# Patient Record
Sex: Male | Born: 1945 | Race: White | Hispanic: No | Marital: Single | Smoking: Current every day smoker
Health system: Southern US, Community
[De-identification: ages and names within clinical notes are randomized; demographics above are authoritative.]

## PROBLEM LIST (undated history)

## (undated) DIAGNOSIS — D649 Anemia, unspecified: Secondary | ICD-10-CM

## (undated) DIAGNOSIS — I255 Ischemic cardiomyopathy: Secondary | ICD-10-CM

## (undated) DIAGNOSIS — I5022 Chronic systolic (congestive) heart failure: Secondary | ICD-10-CM

## (undated) DIAGNOSIS — I2510 Atherosclerotic heart disease of native coronary artery without angina pectoris: Secondary | ICD-10-CM

## (undated) DIAGNOSIS — F329 Major depressive disorder, single episode, unspecified: Secondary | ICD-10-CM

## (undated) DIAGNOSIS — Z720 Tobacco use: Secondary | ICD-10-CM

## (undated) DIAGNOSIS — F419 Anxiety disorder, unspecified: Secondary | ICD-10-CM

## (undated) DIAGNOSIS — F1010 Alcohol abuse, uncomplicated: Secondary | ICD-10-CM

## (undated) DIAGNOSIS — R0789 Other chest pain: Secondary | ICD-10-CM

## (undated) DIAGNOSIS — R5383 Other fatigue: Secondary | ICD-10-CM

## (undated) DIAGNOSIS — R0602 Shortness of breath: Secondary | ICD-10-CM

## (undated) DIAGNOSIS — I219 Acute myocardial infarction, unspecified: Secondary | ICD-10-CM

## (undated) DIAGNOSIS — I509 Heart failure, unspecified: Secondary | ICD-10-CM

## (undated) DIAGNOSIS — F32A Depression, unspecified: Secondary | ICD-10-CM

## (undated) DIAGNOSIS — J449 Chronic obstructive pulmonary disease, unspecified: Secondary | ICD-10-CM

## (undated) DIAGNOSIS — J439 Emphysema, unspecified: Secondary | ICD-10-CM

## (undated) DIAGNOSIS — M1990 Unspecified osteoarthritis, unspecified site: Secondary | ICD-10-CM

## (undated) DIAGNOSIS — I10 Essential (primary) hypertension: Secondary | ICD-10-CM

## (undated) DIAGNOSIS — R55 Syncope and collapse: Secondary | ICD-10-CM

## (undated) HISTORY — DX: Acute myocardial infarction, unspecified: I21.9

## (undated) HISTORY — DX: Chronic obstructive pulmonary disease, unspecified: J44.9

## (undated) HISTORY — DX: Tobacco use: Z72.0

## (undated) HISTORY — DX: Atherosclerotic heart disease of native coronary artery without angina pectoris: I25.10

## (undated) HISTORY — DX: Anxiety disorder, unspecified: F41.9

## (undated) HISTORY — DX: Ischemic cardiomyopathy: I25.5

## (undated) HISTORY — DX: Alcohol abuse, uncomplicated: F10.10

## (undated) HISTORY — DX: Unspecified osteoarthritis, unspecified site: M19.90

## (undated) HISTORY — DX: Shortness of breath: R06.02

## (undated) HISTORY — DX: Essential (primary) hypertension: I10

## (undated) HISTORY — DX: Chronic systolic (congestive) heart failure: I50.22

## (undated) HISTORY — DX: Syncope and collapse: R55

## (undated) HISTORY — PX: OTHER SURGICAL HISTORY: SHX169

## (undated) HISTORY — DX: Other chest pain: R07.89

## (undated) HISTORY — DX: Major depressive disorder, single episode, unspecified: F32.9

## (undated) HISTORY — DX: Anemia, unspecified: D64.9

## (undated) HISTORY — DX: Emphysema, unspecified: J43.9

## (undated) HISTORY — DX: Other fatigue: R53.83

## (undated) HISTORY — DX: Depression, unspecified: F32.A

---

## 2009-03-24 ENCOUNTER — Emergency Department (HOSPITAL_COMMUNITY): Admission: EM | Admit: 2009-03-24 | Discharge: 2009-03-24 | Payer: Self-pay | Admitting: Emergency Medicine

## 2009-05-29 HISTORY — PX: CORONARY ARTERY BYPASS GRAFT: SHX141

## 2009-11-01 ENCOUNTER — Emergency Department (HOSPITAL_COMMUNITY): Admission: EM | Admit: 2009-11-01 | Discharge: 2009-11-01 | Payer: Self-pay | Admitting: Emergency Medicine

## 2011-09-27 ENCOUNTER — Institutional Professional Consult (permissible substitution): Payer: Self-pay | Admitting: Cardiology

## 2011-10-27 ENCOUNTER — Encounter: Payer: Self-pay | Admitting: Cardiology

## 2011-10-27 ENCOUNTER — Ambulatory Visit (INDEPENDENT_AMBULATORY_CARE_PROVIDER_SITE_OTHER): Payer: Medicare Other | Admitting: Cardiology

## 2011-10-27 VITALS — BP 120/60 | HR 80 | Ht 64.0 in | Wt 153.0 lb

## 2011-10-27 DIAGNOSIS — I255 Ischemic cardiomyopathy: Secondary | ICD-10-CM

## 2011-10-27 DIAGNOSIS — F17200 Nicotine dependence, unspecified, uncomplicated: Secondary | ICD-10-CM

## 2011-10-27 DIAGNOSIS — R0602 Shortness of breath: Secondary | ICD-10-CM

## 2011-10-27 DIAGNOSIS — I219 Acute myocardial infarction, unspecified: Secondary | ICD-10-CM

## 2011-10-27 DIAGNOSIS — I2589 Other forms of chronic ischemic heart disease: Secondary | ICD-10-CM | POA: Diagnosis not present

## 2011-10-27 DIAGNOSIS — I10 Essential (primary) hypertension: Secondary | ICD-10-CM | POA: Diagnosis not present

## 2011-10-27 DIAGNOSIS — Z951 Presence of aortocoronary bypass graft: Secondary | ICD-10-CM

## 2011-10-27 DIAGNOSIS — J449 Chronic obstructive pulmonary disease, unspecified: Secondary | ICD-10-CM

## 2011-10-27 DIAGNOSIS — Z720 Tobacco use: Secondary | ICD-10-CM | POA: Insufficient documentation

## 2011-10-27 DIAGNOSIS — R0789 Other chest pain: Secondary | ICD-10-CM

## 2011-10-27 DIAGNOSIS — I2510 Atherosclerotic heart disease of native coronary artery without angina pectoris: Secondary | ICD-10-CM

## 2011-10-27 DIAGNOSIS — J441 Chronic obstructive pulmonary disease with (acute) exacerbation: Secondary | ICD-10-CM | POA: Insufficient documentation

## 2011-10-27 LAB — CBC WITH DIFFERENTIAL/PLATELET
Basophils Absolute: 0 10*3/uL (ref 0.0–0.1)
HCT: 37.4 % — ABNORMAL LOW (ref 39.0–52.0)
Hemoglobin: 13.6 g/dL (ref 13.0–17.0)
MCH: 32.5 pg (ref 26.0–34.0)
MCHC: 36.4 g/dL — ABNORMAL HIGH (ref 30.0–36.0)
MCV: 89.5 fL (ref 78.0–100.0)
Monocytes Absolute: 0.9 10*3/uL (ref 0.1–1.0)
Monocytes Relative: 11 % (ref 3–12)
Neutro Abs: 5.4 10*3/uL (ref 1.7–7.7)
Platelets: 219 10*3/uL (ref 150–400)

## 2011-10-27 LAB — HEPATIC FUNCTION PANEL
ALT: 17 U/L (ref 0–53)
AST: 25 U/L (ref 0–37)
Albumin: 4.3 g/dL (ref 3.5–5.2)
Bilirubin, Direct: 0.2 mg/dL (ref 0.0–0.3)
Indirect Bilirubin: 0.4 mg/dL (ref 0.0–0.9)
Total Protein: 7 g/dL (ref 6.0–8.3)

## 2011-10-27 LAB — BASIC METABOLIC PANEL
Creat: 0.94 mg/dL (ref 0.50–1.35)
Sodium: 130 mEq/L — ABNORMAL LOW (ref 135–145)

## 2011-10-27 LAB — LIPID PANEL
HDL: 60 mg/dL (ref >39)
VLDL: 8 mg/dL (ref 0–40)

## 2011-10-27 NOTE — Patient Instructions (Addendum)
Increase lisinopril to 20 mg daily.  We will get your records from the [facility]  We will refer you to a cardiac surgeon for a second opinion about your sternum  You need to quit drinking alcohol and stop smoking.  We will check lab work today.

## 2011-10-27 NOTE — Assessment & Plan Note (Signed)
I counseled patient on the benefits of smoking cessation. He is unwilling to quit at this time. I also discussed the importance of alcohol abstinence especially with his left ventricular dysfunction.

## 2011-10-27 NOTE — Assessment & Plan Note (Signed)
He is status post CABG February 2012. We have requested his records from the [facility] [place]. He appears to be on appropriate therapy at this time but given his left ventricular dysfunction we will increase his lisinopril to 20 mg daily. We will obtain baseline labs today including CBC, chemistries, lipid panel, and BNP.

## 2011-10-27 NOTE — Progress Notes (Signed)
[name] Date of Birth: [birth year] Medical Record #[id number]  History of Present Illness: [name] is a 66 year old white male who is self-referred for cardiac care. He has a known history of coronary disease. He initially presented with syncope in February 2011. He underwent cardiac catheterization and subsequent coronary bypass surgery x4 at the [facility]. 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 Since his heart surgery he has had nonunion of the left parasternal region. He has had significant musculoskeletal pain. He complains that his chest hurts all the time and that his ribs move back and forth in this area when he breathes. He states that his surgeon at the [place] recommended no further treatment. He does complain of feeling short of breath at times. He continues to smoke one pack per day. He also has a high alcohol intake of 5-6 beers per day and sometimes more. He is able to walk 1 mile. He denies any increase in edema or orthopnea. He's had no recurrent episodes of syncope or palpitations.  Current Outpatient Prescriptions on File Prior to Visit  Medication Sig Dispense Refill  . albuterol (PROVENTIL HFA;VENTOLIN HFA) 108 (90 BASE) MCG/ACT inhaler Inhale 2 puffs into the lungs every 6 (six) hours as needed.      Marland Kitchen lisinopril (PRINIVIL,ZESTRIL) 20 MG tablet Take 10 mg by mouth daily.      . metoprolol (LOPRESSOR) 50 MG tablet Take 50 mg by mouth 2 (two) times daily.      . simvastatin (ZOCOR) 80 MG tablet Take 80 mg by mouth at bedtime.        Allergies  Allergen Reactions  . Penicillins     Past Medical History  Diagnosis Date  . Myocardial infarction   . Musculoskeletal chest pain   . Fatigue   . SOB (shortness of breath)   . Arthritis   . Anxiety   . Depression   . Syncope   . Ischemic cardiomyopathy   . COPD (chronic obstructive pulmonary disease)   . Hypertension   . Tobacco abuse   . Alcohol abuse     Past Surgical History    Procedure Date  . Coronary artery bypass graft 2011    History  Smoking status  . Current Everyday Smoker -- 1.0 packs/day for 42 years  . Types: Cigarettes  Smokeless tobacco  . Never Used    History  Alcohol Use  . Yes    5-6 beers per day    History reviewed. No pertinent family history.  Review of Systems: The review of systems is positive for musculoskeletal chest pain. He thinks that he has had a echocardiogram and stress test at the [place] since his surgery but does not know the results. It has been recommended that he have a defibrillator. All other systems were reviewed and are negative.  Physical Exam: BP 120/60  Pulse 80  Ht 5\' 4"  (1.626 m)  Wt 153 lb (69.4 kg)  BMI 26.26 kg/m2 He is a chronically ill appearing white male in no acute distress. He is normocephalic, atraumatic. Pupils are equal round and reactive to light and accommodation. Sclera are clear. His oropharynx reveals poor dental repair. Neck is supple without JVD, adenopathy, thyromegaly, or bruits. Lungs are clear. Cardiac exam reveals a regular rate and rhythm without gallop, murmur, or click. Chest wall reveals a median sternotomy scar. There is nonunion of the lower left parasternal region with significant tenderness to palpation. Abdomen is  soft and nontender without masses or bruits. There is no hepatosplenomegaly. Femoral and pedal pulses are 2+ and symmetric. He has no edema. Skin is warm and dry. He is alert and oriented x3. Cranial nerves II through XII are intact. LABORATORY DATA: ECG today demonstrates normal sinus rhythm with left atrial enlargement and interventricular conduction delay. 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  Assessment / Plan:

## 2011-10-27 NOTE — Assessment & Plan Note (Signed)
The degree of his left ventricular function is unknown. Apparently it is severe enough that he has been recommended for an ICD. I explained the rationale for an ICD to reduce his risk of sudden death. Again we will obtain [facility] to [facility]. He may be a candidate for the addition of Aldactone. If ejection fraction is less than 35% despite revascularization then ICD would be recommended.

## 2011-10-27 NOTE — Assessment & Plan Note (Signed)
Patient does appear to have some dysuria and of his sternum. I'll request a second opinion from one of our cardiothoracic surgeons here. He may be a candidate to rewire the sternum.

## 2011-11-02 ENCOUNTER — Telehealth: Payer: Self-pay

## 2011-11-02 NOTE — Telephone Encounter (Signed)
Patient called no answer.Left message on personal voice mail cardiac surgeons want see you until we get records from [facility].Release to obtain these records was faxed 11/02/11.

## 2011-11-22 ENCOUNTER — Ambulatory Visit: Payer: Medicare Other | Admitting: Cardiology

## 2011-11-22 ENCOUNTER — Telehealth: Payer: Self-pay | Admitting: Cardiology

## 2011-11-22 MED ORDER — LISINOPRIL 20 MG PO TABS: 20.0000 mg | ORAL_TABLET | Freq: Every day | ORAL | Status: DC

## 2011-11-22 NOTE — Telephone Encounter (Signed)
Patient called stated he is having trouble getting his records from the [place].States he will call again tomorrow.Lisinopril 20 mg refill sent to [address].Patient was told he missed his appointment today with Dr.Jordan.Appointment scheduled with Dr.Jordan 12/28/11.

## 2011-11-22 NOTE — Telephone Encounter (Signed)
New Problem:    Patient called in because he is having a hard time getting his records form the [place]. Patient also needs a refill of his lisinopril (PRINIVIL,ZESTRIL) 20 MG tablet filled with the same [place].

## 2011-12-05 ENCOUNTER — Telehealth: Payer: Self-pay | Admitting: Cardiology

## 2011-12-05 NOTE — Telephone Encounter (Signed)
New msg Pt wants to talk to you. Please call 

## 2011-12-05 NOTE — Telephone Encounter (Signed)
Received phone call from patient's wife stated need to refax request to obtain cath report,heart surgery op note,[facility] [place]Fax # [phone].Patient will come by office this afternoon to resign medical release form.

## 2011-12-06 NOTE — Telephone Encounter (Signed)
Patient came to office 12/05/11 and signed 2nd medical release to obtain cath report,cabg op note,discharge summary from 03/2010 [facility]. Request was faxed to fax # [phone].

## 2011-12-28 ENCOUNTER — Ambulatory Visit (INDEPENDENT_AMBULATORY_CARE_PROVIDER_SITE_OTHER): Payer: Medicare Other | Admitting: Cardiology

## 2011-12-28 ENCOUNTER — Ambulatory Visit: Payer: Medicare Other | Admitting: Cardiology

## 2011-12-28 ENCOUNTER — Telehealth: Payer: Self-pay

## 2011-12-28 ENCOUNTER — Encounter: Payer: Self-pay | Admitting: Cardiology

## 2011-12-28 ENCOUNTER — Telehealth: Payer: Self-pay | Admitting: Cardiology

## 2011-12-28 VITALS — BP 128/68 | HR 77 | Ht 64.0 in | Wt 153.0 lb

## 2011-12-28 DIAGNOSIS — I509 Heart failure, unspecified: Secondary | ICD-10-CM | POA: Diagnosis not present

## 2011-12-28 DIAGNOSIS — E871 Hypo-osmolality and hyponatremia: Secondary | ICD-10-CM | POA: Insufficient documentation

## 2011-12-28 DIAGNOSIS — Z720 Tobacco use: Secondary | ICD-10-CM

## 2011-12-28 DIAGNOSIS — I2589 Other forms of chronic ischemic heart disease: Secondary | ICD-10-CM

## 2011-12-28 DIAGNOSIS — T82218A Other mechanical complication of coronary artery bypass graft, initial encounter: Secondary | ICD-10-CM

## 2011-12-28 DIAGNOSIS — F17200 Nicotine dependence, unspecified, uncomplicated: Secondary | ICD-10-CM

## 2011-12-28 DIAGNOSIS — R0789 Other chest pain: Secondary | ICD-10-CM

## 2011-12-28 DIAGNOSIS — I2510 Atherosclerotic heart disease of native coronary artery without angina pectoris: Secondary | ICD-10-CM

## 2011-12-28 DIAGNOSIS — I255 Ischemic cardiomyopathy: Secondary | ICD-10-CM

## 2011-12-28 MED ORDER — SPIRONOLACTONE 25 MG PO TABS: 25.0000 mg | ORAL_TABLET | Freq: Every day | ORAL | Status: DC

## 2011-12-28 NOTE — Telephone Encounter (Signed)
FYI :Pt was in today and didn't know the name of a med he was on, wife calling back with the name, spirolactone 25mg 

## 2011-12-28 NOTE — Addendum Note (Signed)
Addended by: Meda Klinefelter D on: 12/28/2011 05:00 PM   Modules accepted: Orders

## 2011-12-28 NOTE — Assessment & Plan Note (Signed)
Patient remains asymptomatic from a cardiac standpoint. Continue to focus on risk factor modification. I strongly encouraged smoking cessation.

## 2011-12-28 NOTE — Patient Instructions (Signed)
You need to restrict your fluid intake total to 1.5 liters per day.  Continue your current medication.  I will see you again in 6 months

## 2011-12-28 NOTE — Assessment & Plan Note (Signed)
I do not have the most recent evaluation of his LV function. Presumably his ejection fraction is still less than 35% as a defibrillator has been recommended. Patient does not want this. He is on appropriate medications and I agree with the addition of Aldactone. I stressed the importance of sodium restriction and fluid restriction. I have recommended abstinence from alcohol.

## 2011-12-28 NOTE — Progress Notes (Signed)
[name] Date of Birth: 1945/10/15 Medical Record #[id number]  History of Present Illness: Mr. Michon is seen today for followup of coronary disease. He initially presented with syncope in February 2011. He underwent cardiac catheterization and subsequent coronary bypass surgery x4 at the [facility] this included an LIMA graft to LAD, RIMA graft to the distal RCA, and saphenous vein graft sequentially to the first and second obtuse marginal vessels. Cardiac catheterization demonstrated chronic occlusions of the RCA, LAD, and obtuse marginal vessels. He had a left ventricular aneurysm repair. Ejection fraction was 30%. Since his heart surgery he has had nonunion of the left parasternal region. He has had significant musculoskeletal pain. He complains that his chest hurts all the time and that his ribs move back and forth in this area when he breathes. I did receive extensive records from his hospitalization in November of 2011. There were no records since then. He reports that he was recently started on Aldactone. He apparently had a followup visit with his cardiac surgeon there recently but left after waiting for 2 hours to be seen.  Current Outpatient Prescriptions on File Prior to Visit  Medication Sig Dispense Refill  . albuterol (PROVENTIL HFA;VENTOLIN HFA) 108 (90 BASE) MCG/ACT inhaler Inhale 2 puffs into the lungs every 6 (six) hours as needed.      Marland Kitchen aspirin 81 MG tablet Take 81 mg by mouth daily.      . B Complex-Folic Acid (B COMPLEX-VITAMIN B12) TABS Take by mouth.      . folic acid (FOLVITE) 1 MG tablet Take 1 mg by mouth daily.      Marland Kitchen lisinopril (PRINIVIL,ZESTRIL) 20 MG tablet Take 1 tablet (20 mg total) by mouth daily.  30 tablet  3  . metoprolol (LOPRESSOR) 50 MG tablet Take 50 mg by mouth 2 (two) times daily.      . Multiple Vitamins-Minerals (MULTIVITAMINS THER. W/MINERALS) TABS Take 1 tablet by mouth daily.      . simvastatin (ZOCOR) 80 MG tablet Take 80 mg by mouth at  bedtime.      Marland Kitchen spironolactone (ALDACTONE) 25 MG tablet Take 25 mg by mouth daily. Unsure if 12.5 mg or 25 mg        Allergies  Allergen Reactions  . Penicillins     Past Medical History  Diagnosis Date  . Myocardial infarction   . Musculoskeletal chest pain   . Fatigue   . SOB (shortness of breath)   . Arthritis   . Anxiety   . Depression   . Syncope   . Ischemic cardiomyopathy   . COPD (chronic obstructive pulmonary disease)   . Hypertension   . Tobacco abuse   . Alcohol abuse   . CAD (coronary artery disease)     Past Surgical History  Procedure Date  . Coronary artery bypass graft 2011    History  Smoking status  . Current Everyday Smoker -- 1.0 packs/day for 42 years  . Types: Cigarettes  Smokeless tobacco  . Never Used    History  Alcohol Use  . Yes    5-6 beers per day    History reviewed. No pertinent family history.  Review of Systems: The review of systems is positive for musculoskeletal chest pain. He continues to drink a moderate amount of alcohol. He continues to smoke. All other systems were reviewed and are negative.  Physical Exam: BP 128/68  Pulse 77  Ht 5\' 4"  (1.626 m)  Wt 69.4 kg (153 lb)  BMI 26.26 kg/m2  SpO2 95% He is a chronically ill appearing white male in no acute distress. He is normocephalic, atraumatic. Pupils are equal round and reactive to light and accommodation. Sclera are clear. His oropharynx reveals poor dental repair. Neck is supple without JVD, adenopathy, thyromegaly, or bruits. Lungs are clear. Cardiac exam reveals a regular rate and rhythm without gallop, murmur, or click. Chest wall reveals a median sternotomy scar. There is nonunion of the lower left parasternal region with significant tenderness to palpation. Abdomen is soft and nontender without masses or bruits. There is no hepatosplenomegaly. Femoral and pedal pulses are 2+ and symmetric. He has no edema. Skin is warm and dry. He is alert and oriented x3. Cranial  nerves II through XII are intact. LABORATORY DATA: From 10/27/2011 cholesterol 159, triglycerides 40, HDL 60, LDL 91. Chemistries were remarkable for a sodium of 1:30. BUN 12, creatinine 0.94. Liver function studies were all normal. BNP level was 887.  Assessment / Plan:

## 2011-12-28 NOTE — Telephone Encounter (Signed)
Received records from [place].Copy of discharged summary and CABG op note faxed to [facility] [place].

## 2011-12-28 NOTE — Assessment & Plan Note (Signed)
Recommend fluid restriction to 1.5 L per day.

## 2011-12-28 NOTE — Assessment & Plan Note (Signed)
Patient has requested a second opinion from our CT surgeons here. We'll forward his records there.

## 2011-12-28 NOTE — Telephone Encounter (Signed)
Received phone call from Dr.VanTright's office appointment scheduled with Dr.Van Tright 01/19/12 at 3:45pm.

## 2012-01-19 ENCOUNTER — Other Ambulatory Visit: Payer: Self-pay | Admitting: Cardiothoracic Surgery

## 2012-01-19 ENCOUNTER — Ambulatory Visit
Admission: RE | Admit: 2012-01-19 | Discharge: 2012-01-19 | Disposition: A | Discharge status: Home / Self Care | Payer: Medicare Other | Source: Ambulatory Visit | Attending: Cardiothoracic Surgery | Admitting: Cardiothoracic Surgery

## 2012-01-19 ENCOUNTER — Encounter: Payer: Self-pay | Admitting: Cardiothoracic Surgery

## 2012-01-19 ENCOUNTER — Institutional Professional Consult (permissible substitution) (INDEPENDENT_AMBULATORY_CARE_PROVIDER_SITE_OTHER): Payer: Medicare Other | Admitting: Cardiothoracic Surgery

## 2012-01-19 VITALS — BP 111/74 | HR 78 | Resp 18 | Ht 64.0 in | Wt 158.0 lb

## 2012-01-19 DIAGNOSIS — I2510 Atherosclerotic heart disease of native coronary artery without angina pectoris: Secondary | ICD-10-CM

## 2012-01-19 DIAGNOSIS — J449 Chronic obstructive pulmonary disease, unspecified: Secondary | ICD-10-CM | POA: Diagnosis not present

## 2012-01-19 DIAGNOSIS — M954 Acquired deformity of chest and rib: Secondary | ICD-10-CM

## 2012-01-19 DIAGNOSIS — R079 Chest pain, unspecified: Secondary | ICD-10-CM | POA: Diagnosis not present

## 2012-01-19 DIAGNOSIS — I517 Cardiomegaly: Secondary | ICD-10-CM | POA: Diagnosis not present

## 2012-01-19 DIAGNOSIS — Z951 Presence of aortocoronary bypass graft: Secondary | ICD-10-CM | POA: Diagnosis not present

## 2012-01-19 NOTE — Progress Notes (Signed)
PCP is No primary provider on file. Referring Provider is [place], Peter M, MD                      [address] [place].[phone]     - Chief Complaint  Patient presents with  . Coronary Artery Disease    Referral from Dr Sw[place]and for eval on CAD, [facility]    HPI: Patient presents for evaluation of a painful left anterior chest wall click due to probable dislocation of the anterior left fourth rib with the sternal cartilage  --the costal chondral junction--[facility] The patient had bilateral IMA graft placed as well as vein grafts and plication of an LV aneurysm. That time his EF was 30%. Patient has not been restudied and has not had a 2-D echo since surgery but he denies angina or symptoms of CHF. He does complain of a painful left anterior chest wall click every time he coughs or takes a deep breath. He denies any specific event that this problem filed such as lifting pushing being struck with a blow to the chest or bile and coughing. He does state that he had a prolonged postoperative course and required tracheostomy and prolonged ventilation following the operation in 2011 at the [facility]. A chest x-ray performed today shows the sternal wires all intact except for the most lower wire which is broken. The problem on exam does not appear to be the sternal closure or malunion but in fact left costochondral junction. A CT scan is pending to further evaluate the chest wall anatomy.  Past Medical History  Diagnosis Date  . Myocardial infarction   . Musculoskeletal chest pain   . Fatigue   . SOB (shortness of breath)   . Arthritis   . Anxiety   . Depression   . Syncope   . Ischemic cardiomyopathy   . COPD (chronic obstructive pulmonary disease)   . Hypertension   . Tobacco abuse   . Alcohol abuse   . CAD (coronary artery disease)     Past Surgical History  Procedure Date  . Coronary artery bypass  graft 2011    No family history on file.  Social History History  Substance Use Topics  . Smoking status: Current Everyday Smoker -- 1.0 packs/day for 42 years    Types: Cigarettes  . Smokeless tobacco: Never Used  . Alcohol Use: Yes     5-6 beers per day    Current Outpatient Prescriptions  Medication Sig Dispense Refill  . albuterol (PROVENTIL HFA;VENTOLIN HFA) 108 (90 BASE) MCG/ACT inhaler Inhale 2 puffs into the lungs every 6 (six) hours as needed.      Marland Kitchen aspirin 81 MG tablet Take 81 mg by mouth daily.      . B Complex-Folic Acid (B COMPLEX-VITAMIN B12) TABS Take by mouth.      . folic acid (FOLVITE) 1 MG tablet Take 1 mg by mouth daily.      Marland Kitchen lisinopril (PRINIVIL,ZESTRIL) 20 MG tablet Take 1 tablet (20 mg total) by mouth daily.  30 tablet  3  . metoprolol (LOPRESSOR) 50 MG tablet Take 50 mg by mouth 2 (two) times daily.      . Multiple Vitamins-Minerals (MULTIVITAMINS THER. W/MINERALS) TABS Take 1 tablet by mouth daily.      . simvastatin (ZOCOR) 80 MG tablet Take 80 mg by mouth  at bedtime.      Marland Kitchen spironolactone (ALDACTONE) 25 MG tablet Take 1 tablet (25 mg total) by mouth daily.        Allergies  Allergen Reactions  . Penicillins Other (See Comments)    Doesn't remember    Review of Systems the patient continues to smoke. He drinks a sixpack of beer every 3-4 days. He is disabled and retired.  BP 111/74  Pulse 78  Resp 18  Ht 5\' 4"  (1.626 m)  Wt 158 lb (71.668 kg)  BMI 27.12 kg/m2  SpO2 96% Physical Exam Alert and comfortable HEENT normocephalic Neck without JVD or bruit Thorax with a well-healed sternal incision, there is no sternal instability but there is a click to the left of the sternum in the midclavicular line at approximately the fourth interspace Cardiac regular rhythm no murmur or gallop Abdomen soft without pulsatile mass Extremities with mild to moderate clubbing but no tenderness or cyanosis No palpable pedal pulses Neuro intact normal  gait  Diagnostic Tests: Chest x-ray done today with results showing previous CABG with sternal wires intact except for the lower most wire which is broken Impression: Chest wall instability following CABG probably due to dislocation of the costochondral junction of a anterior rib  Plan: CT scan of the chest in further detail anatomy. Patient would need to completely stop smoking prior to having rib plating performed, and a 2-D echo would required. Marland Kitchen

## 2012-01-19 NOTE — Progress Notes (Deleted)
PCP is No primary provider on file. Referring Provider is [place], Malaiah Viramontes M, MD                     [address] [place].[address]            [place] [address]          [phone]     Vicryl in a is a is a a a a and is in is a and a is a that and is a is a is with the is a was is is is a a a and a a is is a the is is a stenosis 26 is a has is that of a left is severe closed is a half: Were stable from his is is a yellow is a will a fourth is a a a a and a will of of the and a a a a to the LAD is 2 Chief Complaint  Patient presents with  . Coronary Artery Disease    Referral from Dr [place]and for eval on CAD, [facility]    HPI:   Past Medical History  Diagnosis Date  . Myocardial infarction   . Musculoskeletal chest pain   . Fatigue   . SOB (shortness of breath)   . Arthritis   . Anxiety   . Depression   . Syncope   . Ischemic cardiomyopathy   . COPD (chronic obstructive pulmonary disease)   . Hypertension   . Tobacco abuse   . Alcohol abuse   . CAD (coronary artery disease)     Past Surgical History  Procedure Date  . Coronary artery bypass graft 2011    No family history on file.  Social History History  Substance Use Topics  . Smoking status: Current Everyday Smoker -- 1.0 packs/day for 42 years    Types: Cigarettes  . Smokeless tobacco: Never Used  . Alcohol Use: Yes     5-6 beers per day    Current Outpatient Prescriptions  Medication Sig Dispense Refill  . albuterol (PROVENTIL HFA;VENTOLIN HFA) 108 (90 BASE) MCG/ACT inhaler Inhale 2 puffs into the lungs every 6 (six) hours as needed.      Marland Kitchen aspirin 81 MG tablet Take 81 mg by mouth daily.      . B Complex-Folic Acid (B COMPLEX-VITAMIN B12) TABS Take by mouth.      . folic acid (FOLVITE) 1 MG tablet Take 1 mg by mouth daily.      Marland Kitchen lisinopril (PRINIVIL,ZESTRIL) 20 MG tablet Take 1 tablet (20 mg total) by mouth daily.  30 tablet  3  . metoprolol (LOPRESSOR) 50 MG tablet Take 50 mg by mouth 2  (two) times daily.      . Multiple Vitamins-Minerals (MULTIVITAMINS THER. W/MINERALS) TABS Take 1 tablet by mouth daily.      . simvastatin (ZOCOR) 80 MG tablet Take 80 mg by mouth at bedtime.      Marland Kitchen spironolactone (ALDACTONE) 25 MG tablet Take 1 tablet (25 mg total) by mouth daily.        Allergies  Allergen Reactions  . Penicillins Other (See Comments)    Doesn't remember    Review of Systems  BP 111/74  Pulse 78  Resp 18  Ht 5\' 4"  (1.626 m)  Wt 158 lb (71.668 kg)  BMI 27.12 kg/m2  SpO2 96% Physical Exam   Diagnostic Tests:   Impression:   Plan:

## 2012-01-22 ENCOUNTER — Other Ambulatory Visit: Payer: Self-pay | Admitting: Cardiothoracic Surgery

## 2012-01-22 DIAGNOSIS — I2510 Atherosclerotic heart disease of native coronary artery without angina pectoris: Secondary | ICD-10-CM

## 2012-01-22 DIAGNOSIS — R0789 Other chest pain: Secondary | ICD-10-CM

## 2012-01-22 DIAGNOSIS — Z951 Presence of aortocoronary bypass graft: Secondary | ICD-10-CM

## 2012-01-30 DIAGNOSIS — I2510 Atherosclerotic heart disease of native coronary artery without angina pectoris: Secondary | ICD-10-CM | POA: Diagnosis not present

## 2012-01-31 ENCOUNTER — Ambulatory Visit (INDEPENDENT_AMBULATORY_CARE_PROVIDER_SITE_OTHER): Payer: Medicare Other | Admitting: Cardiothoracic Surgery

## 2012-01-31 ENCOUNTER — Encounter: Payer: Self-pay | Admitting: Cardiothoracic Surgery

## 2012-01-31 ENCOUNTER — Ambulatory Visit
Admission: RE | Admit: 2012-01-31 | Discharge: 2012-01-31 | Disposition: A | Discharge status: Home / Self Care | Payer: Medicare Other | Source: Ambulatory Visit | Attending: Cardiothoracic Surgery | Admitting: Cardiothoracic Surgery

## 2012-01-31 VITALS — BP 130/70 | HR 66 | Resp 16 | Ht 64.0 in | Wt 158.0 lb

## 2012-01-31 DIAGNOSIS — M9689 Other intraoperative and postprocedural complications and disorders of the musculoskeletal system: Secondary | ICD-10-CM

## 2012-01-31 DIAGNOSIS — IMO0002 Reserved for concepts with insufficient information to code with codable children: Secondary | ICD-10-CM | POA: Diagnosis not present

## 2012-01-31 DIAGNOSIS — G8912 Acute post-thoracotomy pain: Secondary | ICD-10-CM | POA: Diagnosis not present

## 2012-01-31 DIAGNOSIS — Z951 Presence of aortocoronary bypass graft: Secondary | ICD-10-CM | POA: Diagnosis not present

## 2012-01-31 DIAGNOSIS — G8918 Other acute postprocedural pain: Secondary | ICD-10-CM

## 2012-01-31 DIAGNOSIS — R071 Chest pain on breathing: Secondary | ICD-10-CM

## 2012-01-31 DIAGNOSIS — R0789 Other chest pain: Secondary | ICD-10-CM

## 2012-01-31 DIAGNOSIS — R079 Chest pain, unspecified: Secondary | ICD-10-CM | POA: Diagnosis not present

## 2012-01-31 MED ORDER — IOHEXOL 300 MG/ML  SOLN: 75.0000 mL | Freq: Once | INTRAMUSCULAR | Status: AC | PRN

## 2012-01-31 NOTE — Progress Notes (Signed)
PCP is No primary provider on file. Referring Provider is [place], Peter M, MD  Chief Complaint  Patient presents with  . Chest Pain    due to chest wall instability...Marland KitchenCT CHEST W/CONT.    HPI: The patient returns for review of chest wall pain following combined CABG and plication of LV aneurysm at the [facility] 2 years ago. The patient continues to smoke. The patient has Chest wall pain but no clear anginal pain. A CT scan of the chest was performed today for which the patient returns for review. The CT scan of the chest shows minimal fibrous malunion of the upper portion of the sternal closure with a separation of the sternal edges only 2-3 mm. There is no clear fracture or dislocation of the costochondral junction    Past Medical History  Diagnosis Date  . Myocardial infarction   . Musculoskeletal chest pain   . Fatigue   . SOB (shortness of breath)   . Arthritis   . Anxiety   . Depression   . Syncope   . Ischemic cardiomyopathy   . COPD (chronic obstructive pulmonary disease)   . Hypertension   . Tobacco abuse   . Alcohol abuse   . CAD (coronary artery disease)     Past Surgical History  Procedure Date  . Coronary artery bypass graft 2011    No family history on file.  Social History History  Substance Use Topics  . Smoking status: Current Everyday Smoker -- 1.0 packs/day for 42 years    Types: Cigarettes  . Smokeless tobacco: Never Used  . Alcohol Use: Yes     5-6 beers per day    Current Outpatient Prescriptions  Medication Sig Dispense Refill  . albuterol (PROVENTIL HFA;VENTOLIN HFA) 108 (90 BASE) MCG/ACT inhaler Inhale 2 puffs into the lungs every 6 (six) hours as needed.      Marland Kitchen aspirin 81 MG tablet Take 81 mg by mouth daily.      . B Complex-Folic Acid (B COMPLEX-VITAMIN B12) TABS Take by mouth.      . folic acid (FOLVITE) 1 MG tablet Take 1 mg by mouth daily.      Marland Kitchen lisinopril (PRINIVIL,ZESTRIL) 20 MG tablet Take 1 tablet (20 mg total) by  mouth daily.  30 tablet  3  . metoprolol (LOPRESSOR) 50 MG tablet Take 50 mg by mouth 2 (two) times daily.      . Multiple Vitamins-Minerals (MULTIVITAMINS THER. W/MINERALS) TABS Take 1 tablet by mouth daily.      . simvastatin (ZOCOR) 80 MG tablet Take 80 mg by mouth at bedtime.      Marland Kitchen spironolactone (ALDACTONE) 25 MG tablet Take 1 tablet (25 mg total) by mouth daily.       No current facility-administered medications for this visit.   Facility-Administered Medications Ordered in Other Visits  Medication Dose Route Frequency Provider Last Rate Last Dose  . iohexol (OMNIPAQUE) 300 MG/ML solution 75 mL  75 mL Intravenous Once PRN Medication Radiologist, MD        Allergies  Allergen Reactions  . Penicillins Other (See Comments)    Doesn't remember    Review of SystemsNo fever no weight loss  BP 130/70  Pulse 66  Resp 16  Ht 5\' 4"  (1.626 m)  Wt 158 lb (71.668 kg)  BMI 27.12 kg/m2  SpO2 98% Physical Exam Alert and comfortable Breath sounds with scattered rhonchi Cardiac rhythm regular No gross instability of the sternum no sternal click elicited No  sign of infection of the sternal incision  Diagnostic Tests: CT scan of the chest shows minimal fibrous malunion of the upper portion of the sternal closure  Impression: Recommend conservative management. Do not recommend surgery which would require complete redo sternotomy and rewiring the entire sternum minimal additional benefit and significant risk  Plan:Conservative management, follow up in 8 weeks to monitor pain. 1 prescription of Vicodin 5.0 provided.

## 2012-02-06 ENCOUNTER — Encounter: Payer: Self-pay | Admitting: Cardiothoracic Surgery

## 2012-03-27 ENCOUNTER — Encounter: Payer: Self-pay | Admitting: Cardiothoracic Surgery

## 2012-03-27 ENCOUNTER — Ambulatory Visit (INDEPENDENT_AMBULATORY_CARE_PROVIDER_SITE_OTHER): Payer: Medicare Other | Admitting: Cardiothoracic Surgery

## 2012-03-27 VITALS — BP 127/73 | HR 68 | Resp 20 | Ht 64.0 in | Wt 158.0 lb

## 2012-03-27 DIAGNOSIS — R071 Chest pain on breathing: Secondary | ICD-10-CM

## 2012-03-27 DIAGNOSIS — R52 Pain, unspecified: Secondary | ICD-10-CM

## 2012-03-27 DIAGNOSIS — Z5189 Encounter for other specified aftercare: Secondary | ICD-10-CM

## 2012-03-27 DIAGNOSIS — Z951 Presence of aortocoronary bypass graft: Secondary | ICD-10-CM | POA: Diagnosis not present

## 2012-03-27 DIAGNOSIS — R0789 Other chest pain: Secondary | ICD-10-CM

## 2012-03-27 NOTE — Progress Notes (Signed)
PCP is No primary provider on file. Referring Provider is [place], Mckinley Adelstein M, MD  Chief Complaint  Patient presents with  . Follow-up    2 week f/u on conservative management with chest wall pain.    HPI: This patient had a complicated CABG with LV aneurysm repair at the [facility] approximately 2 years ago. 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 He has chronic pain syndrome. He returns for pain medication as his appointment at the medical clinic at the [facility] has been extended after December. He still smokes at least a pack a day. I told him on his previous visit that he is not a candidate for surgical rewiring of the sternum.  Past Medical History  Diagnosis Date  . Myocardial infarction   . Musculoskeletal chest pain   . Fatigue   . SOB (shortness of breath)   . Arthritis   . Anxiety   . Depression   . Syncope   . Ischemic cardiomyopathy   . COPD (chronic obstructive pulmonary disease)   . Hypertension   . Tobacco abuse   . Alcohol abuse   . CAD (coronary artery disease)     Past Surgical History  Procedure Date  . Coronary artery bypass graft 2011    No family history on file.  Social History History  Substance Use Topics  . Smoking status: Current Every Day Smoker -- 1.0 packs/day for 42 years    Types: Cigarettes  . Smokeless tobacco: Never Used  . Alcohol Use: Yes     5-6 beers per day    Current Outpatient Prescriptions  Medication Sig Dispense Refill  . albuterol (PROVENTIL HFA;VENTOLIN HFA) 108 (90 BASE) MCG/ACT inhaler Inhale 2 puffs into the lungs every 6 (six) hours as needed.      Marland Kitchen aspirin 81 MG tablet Take 81 mg by mouth daily.      . B Complex-Folic Acid (B COMPLEX-VITAMIN B12) TABS Take by mouth.      . folic acid (FOLVITE) 1 MG tablet Take 1 mg by mouth daily.      Marland Kitchen HYDROcodone-acetaminophen (VICODIN) 5-500 MG per tablet Take 1 tablet by mouth every 6 (six) hours as needed.       Marland Kitchen lisinopril (PRINIVIL,ZESTRIL) 20 MG tablet Take 1 tablet (20 mg total) by mouth daily.  30 tablet  3  . metoprolol (LOPRESSOR) 50 MG tablet Take 50 mg by mouth 2 (two) times daily.      . simvastatin (ZOCOR) 80 MG tablet Take 80 mg by mouth at bedtime.      Marland Kitchen spironolactone (ALDACTONE) 25 MG tablet Take 1 tablet (25 mg total) by mouth daily.        Allergies  Allergen Reactions  . Penicillins Other (See Comments)    Doesn't remember    Review of Systems persistent chest wall pain especially with coughing  BP 127/73  Pulse 68  Resp 20  Ht 5\' 4"  (1.626 m)  Wt 158 lb (71.668 kg)  BMI 27.12 kg/m2  SpO2 96% Physical Exam Alert and comfortable Minimal sternal tenderness and upper malunion, no evidence of infection or cellulitis Regular rhythm Scattered rhonchi bilaterally  Diagnostic Tests: Chest x-ray not performed today old previous x-ray reviewed  Impression: Chest wall pain from mild fiber sternal malunion not a candidate for surgical rewiring because of poor general health and active smoking  Plan: Patient will be referred back to the [facility].  He should not return here. No further pain medication prescription will be provided.

## 2012-07-01 ENCOUNTER — Encounter: Payer: Self-pay | Admitting: Cardiology

## 2012-07-01 ENCOUNTER — Ambulatory Visit (INDEPENDENT_AMBULATORY_CARE_PROVIDER_SITE_OTHER): Payer: Medicare Other | Admitting: Cardiology

## 2012-07-01 VITALS — BP 122/70 | HR 78 | Ht 64.0 in | Wt 151.1 lb

## 2012-07-01 DIAGNOSIS — I2589 Other forms of chronic ischemic heart disease: Secondary | ICD-10-CM | POA: Diagnosis not present

## 2012-07-01 DIAGNOSIS — F17200 Nicotine dependence, unspecified, uncomplicated: Secondary | ICD-10-CM

## 2012-07-01 DIAGNOSIS — J449 Chronic obstructive pulmonary disease, unspecified: Secondary | ICD-10-CM

## 2012-07-01 DIAGNOSIS — I2510 Atherosclerotic heart disease of native coronary artery without angina pectoris: Secondary | ICD-10-CM

## 2012-07-01 DIAGNOSIS — Z951 Presence of aortocoronary bypass graft: Secondary | ICD-10-CM

## 2012-07-01 DIAGNOSIS — I255 Ischemic cardiomyopathy: Secondary | ICD-10-CM

## 2012-07-01 DIAGNOSIS — Z720 Tobacco use: Secondary | ICD-10-CM

## 2012-07-01 NOTE — Patient Instructions (Signed)
I encourage you to quit smoking  Continue your current therapy  I will see you again in 6 months.

## 2012-07-01 NOTE — Progress Notes (Signed)
Cory Dennis Date of Birth: [birth year] Medical Record #[id number]  History of Present Illness: Cory Dennis is seen today for followup of coronary disease. He initially presented with syncope in February 2011. He underwent cardiac catheterization and subsequent coronary bypass surgery x4 at the [facility]. this included an LIMA graft to LAD, RIMA graft to the distal RCA, and saphenous vein graft sequentially to the first and second obtuse marginal vessels. Cardiac catheterization demonstrated chronic occlusions of the RCA, LAD, and obtuse marginal vessels. He had a left ventricular aneurysm repair. Ejection fraction was 30%. Since his heart surgery he has had nonunion of the left parasternal region. He was evaluated here by Dr. Donata Clay who did not recommend surgery for his sternum. He still has chronic sternal pain that is unchanged. He is considering smoking cessation and has purchased nicotine patches and gum to help.  Current Outpatient Prescriptions on File Prior to Visit  Medication Sig Dispense Refill  . albuterol (PROVENTIL HFA;VENTOLIN HFA) 108 (90 BASE) MCG/ACT inhaler Inhale 2 puffs into the lungs every 6 (six) hours as needed.      Marland Kitchen aspirin 81 MG tablet Take 81 mg by mouth daily.      . B Complex-Folic Acid (B COMPLEX-VITAMIN B12) TABS Take by mouth.      . folic acid (FOLVITE) 1 MG tablet Take 1 mg by mouth daily.      Marland Kitchen HYDROcodone-acetaminophen (VICODIN) 5-500 MG per tablet Take 1 tablet by mouth every 6 (six) hours as needed.      Marland Kitchen lisinopril (PRINIVIL,ZESTRIL) 20 MG tablet Take 1 tablet (20 mg total) by mouth daily.  30 tablet  3  . metoprolol (LOPRESSOR) 50 MG tablet Take 50 mg by mouth 2 (two) times daily.      . simvastatin (ZOCOR) 80 MG tablet Take 80 mg by mouth at bedtime.      Marland Kitchen spironolactone (ALDACTONE) 25 MG tablet Take 1 tablet (25 mg total) by mouth daily.        Allergies  Allergen Reactions  . Penicillins Other (See Comments)    Doesn't remember    Past  Medical History  Diagnosis Date  . Myocardial infarction   . Musculoskeletal chest pain   . Fatigue   . SOB (shortness of breath)   . Arthritis   . Anxiety   . Depression   . Syncope   . Ischemic cardiomyopathy   . COPD (chronic obstructive pulmonary disease)   . Hypertension   . Tobacco abuse   . Alcohol abuse   . CAD (coronary artery disease)     Past Surgical History  Procedure Date  . Coronary artery bypass graft 2011    History  Smoking status  . Current Every Day Smoker -- 1.0 packs/day for 42 years  . Types: Cigarettes  Smokeless tobacco  . Never Used    History  Alcohol Use  . Yes    Comment: 5-6 beers per day    No family history on file.  Review of Systems: The review of systems is positive for musculoskeletal chest pain.  All other systems were reviewed and are negative.  Physical Exam: BP 122/70  Pulse 78  Ht 5\' 4"  (1.626 m)  Wt 151 lb 1.9 oz (68.548 kg)  BMI 25.94 kg/m2  SpO2 90% He is a chronically ill appearing white male in no acute distress. HEENT exam is unremarkable. His oropharynx reveals poor dental repair. Neck is supple without JVD, adenopathy, thyromegaly, or bruits. Lungs are  clear. Cardiac exam reveals a regular rate and rhythm without gallop, murmur, or click. Chest wall reveals a median sternotomy scar. There is nonunion of the lower left parasternal region with  tenderness to palpation. There is a small ventral hernia in the subxiphoid area. Abdomen is soft and nontender without masses or bruits. There is no hepatosplenomegaly. Femoral and pedal pulses are 2+ and symmetric. He has no edema. Skin is warm and dry. He is alert and oriented x3. Cranial nerves II through XII are intact. LABORATORY DATA:  Assessment / Plan: 1. Coronary disease status post CABG in 2011. Clinically stable.  2. Chronic congestive heart failure with ischemic cardiomyopathy. Ejection fraction 35%. Patient has deferred to ICD placement. He is on appropriate  medical therapy with ACE inhibitor, beta blocker, and Aldactone.  3. 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  4. Tobacco abuse. Patient counseled on smoking cessation. He is going to try to quit with the help of nicotine replacement products.

## 2012-07-23 ENCOUNTER — Telehealth: Payer: Self-pay | Admitting: Cardiology

## 2012-07-23 NOTE — Telephone Encounter (Signed)
Patient called no answer.Left message on personal voice mail need to have PCP to refill pain medication.

## 2012-07-23 NOTE — Telephone Encounter (Signed)
Pt needs his pain meds called in.

## 2012-12-17 ENCOUNTER — Other Ambulatory Visit: Payer: Self-pay

## 2012-12-17 MED ORDER — LISINOPRIL 20 MG PO TABS: 20.0000 mg | ORAL_TABLET | Freq: Every day | ORAL | Status: DC

## 2013-02-26 ENCOUNTER — Encounter: Payer: Self-pay | Admitting: Cardiology

## 2013-02-26 ENCOUNTER — Ambulatory Visit (INDEPENDENT_AMBULATORY_CARE_PROVIDER_SITE_OTHER): Payer: Medicare Other | Admitting: Cardiology

## 2013-02-26 VITALS — BP 110/70 | HR 70 | Ht 64.0 in | Wt 148.1 lb

## 2013-02-26 DIAGNOSIS — I255 Ischemic cardiomyopathy: Secondary | ICD-10-CM

## 2013-02-26 DIAGNOSIS — J449 Chronic obstructive pulmonary disease, unspecified: Secondary | ICD-10-CM

## 2013-02-26 DIAGNOSIS — F17200 Nicotine dependence, unspecified, uncomplicated: Secondary | ICD-10-CM

## 2013-02-26 DIAGNOSIS — I2589 Other forms of chronic ischemic heart disease: Secondary | ICD-10-CM

## 2013-02-26 DIAGNOSIS — Z720 Tobacco use: Secondary | ICD-10-CM

## 2013-02-26 DIAGNOSIS — I2510 Atherosclerotic heart disease of native coronary artery without angina pectoris: Secondary | ICD-10-CM

## 2013-02-26 DIAGNOSIS — Z951 Presence of aortocoronary bypass graft: Secondary | ICD-10-CM

## 2013-02-26 NOTE — Progress Notes (Signed)
Cory Dennis Date of Birth: [birth year] Medical Record #[id number]  History of Present Illness: Cory Dennis is seen today for followup of coronary disease. He is status post CABG in February 2011 of the [facility] [place]. He had a left ventricular aneurysm repair. Ejection fraction was 30%. Since his heart surgery he has had nonunion of the left parasternal region. He was evaluated here by Cory Dennis who did not recommend surgery for his sternum. He still has chronic sternal pain that is unchanged. He has been unable to quit smoking. He denies any dizziness, shortness of breath, or anginal pain.  Current Outpatient Prescriptions on File Prior to Visit  Medication Sig Dispense Refill  . albuterol (PROVENTIL HFA;VENTOLIN HFA) 108 (90 BASE) MCG/ACT inhaler Inhale 2 puffs into the lungs every 6 (six) hours as needed.      Marland Kitchen aspirin 81 MG tablet Take 81 mg by mouth daily.      . B Complex-Folic Acid (B COMPLEX-VITAMIN B12) TABS Take by mouth.      . folic acid (FOLVITE) 1 MG tablet Take 1 mg by mouth daily.      Marland Kitchen lisinopril (PRINIVIL,ZESTRIL) 20 MG tablet Take 1 tablet (20 mg total) by mouth daily.  30 tablet  6  . metoprolol (LOPRESSOR) 50 MG tablet Take 50 mg by mouth 2 (two) times daily.      . simvastatin (ZOCOR) 80 MG tablet Take 80 mg by mouth at bedtime.      Marland Kitchen spironolactone (ALDACTONE) 25 MG tablet Take 1 tablet (25 mg total) by mouth daily.       No current facility-administered medications on file prior to visit.    Allergies  Allergen Reactions  . Penicillins Other (See Comments)    Doesn't remember    Past Medical History  Diagnosis Date  . Myocardial infarction   . Musculoskeletal chest pain   . Fatigue   . SOB (shortness of breath)   . Arthritis   . Anxiety   . Depression   . Syncope   . Ischemic cardiomyopathy   . COPD (chronic obstructive pulmonary disease)   . Hypertension   . Tobacco abuse   . Alcohol abuse   . CAD (coronary artery disease)     Past Surgical  History  Procedure Laterality Date  . Coronary artery bypass graft  2011    History  Smoking status  . Current Every Day Smoker -- 1.00 packs/day for 42 years  . Types: Cigarettes  Smokeless tobacco  . Never Used    History  Alcohol Use  . Yes    Comment: 5-6 beers per day    History reviewed. No pertinent family history.  Review of Systems: The review of systems is positive for musculoskeletal chest pain.  All other systems were reviewed and are negative.  Physical Exam: BP 110/70  Pulse 70  Ht 5\' 4"  (1.626 m)  Wt 148 lb 1.9 oz (67.187 kg)  BMI 25.41 kg/m2 He is a chronically ill appearing white male in no acute distress. HEENT exam is unremarkable. His oropharynx reveals poor dental repair. Neck is supple without JVD, adenopathy, thyromegaly, or bruits. Lungs are clear. Cardiac exam reveals a regular rate and rhythm without gallop, murmur, or click. Chest wall reveals a median sternotomy scar. There is nonunion of the lower left parasternal region with  tenderness to palpation. There is a small ventral hernia in the subxiphoid area. Abdomen is soft and nontender without masses or bruits. There is no hepatosplenomegaly.  Femoral and pedal pulses are 2+ and symmetric. He has no edema. Skin is warm and dry. He is alert and oriented x3. Cranial nerves II through XII are intact.  LABORATORY DATA: ECG today demonstrates normal sinus rhythm with left atrial enlargement, old anterior infarction, and T-wave abnormality consistent with inferior lateral ischemia. This is unchanged from 10/30/2011.  Assessment / Plan: 1. Coronary disease status post CABG in 2011. Clinically stable.  2. Chronic congestive heart failure with ischemic cardiomyopathy. Ejection fraction 35%. Patient has deferred to ICD placement. He is on appropriate medical therapy with ACE inhibitor, beta blocker, and Aldactone and appears euvolemic. His regular lab work is performed at the [facility] clinic. I've asked him to get is  a copy of his results..  3. 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  4. Tobacco abuse. Patient counseled on smoking cessation. He does not appear to be motivated to quit at this time.

## 2013-02-26 NOTE — Patient Instructions (Signed)
Continue your current therapy  You need to stop smoking.  I will see you in 6 months.

## 2013-11-21 ENCOUNTER — Ambulatory Visit: Payer: Medicare Other | Admitting: Cardiology

## 2013-12-24 ENCOUNTER — Encounter: Payer: Self-pay | Admitting: Cardiology

## 2013-12-24 ENCOUNTER — Ambulatory Visit (INDEPENDENT_AMBULATORY_CARE_PROVIDER_SITE_OTHER): Payer: Medicare Other | Admitting: Cardiology

## 2013-12-24 VITALS — BP 140/82 | HR 74 | Ht 64.0 in | Wt 154.0 lb

## 2013-12-24 DIAGNOSIS — J410 Simple chronic bronchitis: Secondary | ICD-10-CM | POA: Diagnosis not present

## 2013-12-24 DIAGNOSIS — Z951 Presence of aortocoronary bypass graft: Secondary | ICD-10-CM

## 2013-12-24 DIAGNOSIS — F17200 Nicotine dependence, unspecified, uncomplicated: Secondary | ICD-10-CM | POA: Diagnosis not present

## 2013-12-24 DIAGNOSIS — I2510 Atherosclerotic heart disease of native coronary artery without angina pectoris: Secondary | ICD-10-CM

## 2013-12-24 DIAGNOSIS — Z720 Tobacco use: Secondary | ICD-10-CM

## 2013-12-24 NOTE — Progress Notes (Signed)
Cory Dennis Date of Birth: [birth year] Medical Record #[id number]  History of Present Illness: Cory Dennis is seen today for followup of coronary disease. He is status post CABG in February 2011 of the [place]. He had a left ventricular aneurysm repair. Ejection fraction was 30%. Since his heart surgery he has had nonunion of the left parasternal region. He was evaluated here by Dr. Donata Dennis who did not recommend surgery for his sternum. He still has chronic sternal pain that is unchanged. He continues to smoke. He denies any dizziness, shortness of breath, or anginal pain. He has a recent cough with some congestion. He has a steroid inhaler and albuterol inhaler but doesn't use them.   Current Outpatient Prescriptions on File Prior to Visit  Medication Sig Dispense Refill  . albuterol (PROVENTIL HFA;VENTOLIN HFA) 108 (90 BASE) MCG/ACT inhaler Inhale 2 puffs into the lungs every 6 (six) hours as needed.      Marland Kitchen aspirin 81 MG tablet Take 81 mg by mouth daily.      . B Complex-Folic Acid (B COMPLEX-VITAMIN B12) TABS Take by mouth.      . folic acid (FOLVITE) 1 MG tablet Take 1 mg by mouth daily.      Marland Kitchen HYDROcodone-acetaminophen (LORTAB) 10-500 MG per tablet Take 1 tablet by mouth every 6 (six) hours as needed for pain.      . metoprolol (LOPRESSOR) 50 MG tablet Take 50 mg by mouth 2 (two) times daily.      . simvastatin (ZOCOR) 80 MG tablet Take 80 mg by mouth at bedtime.      Marland Kitchen spironolactone (ALDACTONE) 25 MG tablet Take 1 tablet (25 mg total) by mouth daily.       No current facility-administered medications on file prior to visit.    Allergies  Allergen Reactions  . Penicillins Other (See Comments)    Doesn't remember    Past Medical History  Diagnosis Date  . Myocardial infarction   . Musculoskeletal chest pain   . Fatigue   . SOB (shortness of breath)   . Arthritis   . Anxiety   . Depression   . Syncope   . Ischemic cardiomyopathy   . COPD (chronic obstructive  pulmonary disease)   . Hypertension   . Tobacco abuse   . Alcohol abuse   . CAD (coronary artery disease)     Past Surgical History  Procedure Laterality Date  . Coronary artery bypass graft  2011    History  Smoking status  . Current Every Day Smoker -- 1.00 packs/day for 42 years  . Types: Cigarettes  Smokeless tobacco  . Never Used    History  Alcohol Use  . Yes    Comment: 5-6 beers per day    History reviewed. No pertinent family history.  Review of Systems: The review of systems is positive for musculoskeletal chest pain.  All other systems were reviewed and are negative.  Physical Exam: BP 140/82  Pulse 74  Ht 5\' 4"  (1.626 m)  Wt 154 lb (69.854 kg)  BMI 26.42 kg/m2 He is a chronically ill appearing white male in no acute distress. HEENT exam is unremarkable. His oropharynx reveals poor dental repair. Neck is supple without JVD, adenopathy, thyromegaly, or bruits. Lungs reveal scattered wheezes and rhonchi. Cardiac exam reveals a regular rate and rhythm without gallop, murmur, or click. Chest wall reveals a median sternotomy scar. There is nonunion of the lower left parasternal region with  tenderness to palpation.  There is a small ventral hernia in the subxiphoid area. Abdomen is soft and nontender without masses or bruits. There is no hepatosplenomegaly. Femoral and pedal pulses are 2+ and symmetric. He has no edema. Skin is warm and dry. He is alert and oriented x3. Cranial nerves II through XII are intact.  LABORATORY DATA:   Assessment / Plan: 1. Coronary disease status post CABG in 2011. Clinically stable.  2. Chronic congestive heart failure with ischemic cardiomyopathy. Ejection fraction 35%. Patient has deferred to ICD placement. He is on appropriate medical therapy with ACE inhibitor, beta blocker, and Aldactone and appears euvolemic. [facility].   3. 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  4. Tobacco abuse. Patient counseled on smoking cessation. He does not appear to be motivated to quit at this time.  5. COPD with chronic bronchitis. Recommend he use his inhalers and stop smoking.

## 2013-12-24 NOTE — Patient Instructions (Signed)
Use your inhalers.  Continue your other therapy  Quit smoking.  I will see you in 6 months.

## 2014-01-30 ENCOUNTER — Telehealth: Payer: Self-pay | Admitting: Cardiology

## 2014-01-30 MED ORDER — FOLIC ACID 1 MG PO TABS: 1.0000 mg | ORAL_TABLET | Freq: Every day | ORAL | Status: DC

## 2014-01-30 NOTE — Telephone Encounter (Signed)
Pt called in stating that he is out of his Folic Acid and needs it refilled.Please call  Thanks

## 2014-01-30 NOTE — Telephone Encounter (Signed)
Rx refill sent to patient pharmacy. Patient called and voiced understanding.

## 2014-02-23 ENCOUNTER — Telehealth: Payer: Self-pay | Admitting: *Deleted

## 2014-02-23 NOTE — Telephone Encounter (Signed)
Patient called no answer.LMTC. 

## 2014-02-23 NOTE — Telephone Encounter (Signed)
[facility] lisinopril 20mg  refill for patient. Is he still to be taking this? He has not refilled it since 10/21/13 and that was only for a 30 day supply and it is not on his last ov note. Please advise. Thanks, MI

## 2014-02-24 ENCOUNTER — Telehealth: Payer: Self-pay | Admitting: Cardiology

## 2014-02-24 NOTE — Telephone Encounter (Signed)
Pt need a new prescription for his Lisinopril 20 mg #30. Please call to [phone].

## 2014-02-25 MED ORDER — FOLIC ACID 1 MG PO TABS: 1.0000 mg | ORAL_TABLET | Freq: Every day | ORAL | Status: DC

## 2014-02-25 MED ORDER — LISINOPRIL 20 MG PO TABS: 20.0000 mg | ORAL_TABLET | Freq: Every day | ORAL | Status: DC

## 2014-02-25 NOTE — Telephone Encounter (Signed)
Returned call to patient he stated he is taking Lisinopril 20 mg daily and needs a refill sent to his pharmacy.Refill sent to pharmacy.

## 2014-02-26 NOTE — Telephone Encounter (Signed)
Spoke to patient 02/25/14 he stated he has been taking lisinopril 20 mg daily.Refill sent to pharmacy.

## 2014-04-15 DIAGNOSIS — Z23 Encounter for immunization: Secondary | ICD-10-CM | POA: Diagnosis not present

## 2014-06-24 ENCOUNTER — Encounter: Payer: Self-pay | Admitting: Cardiology

## 2014-06-24 ENCOUNTER — Ambulatory Visit (INDEPENDENT_AMBULATORY_CARE_PROVIDER_SITE_OTHER): Payer: Medicare Other | Admitting: Cardiology

## 2014-06-24 VITALS — BP 130/80 | HR 88 | Ht 64.0 in | Wt 155.9 lb

## 2014-06-24 DIAGNOSIS — I2510 Atherosclerotic heart disease of native coronary artery without angina pectoris: Secondary | ICD-10-CM

## 2014-06-24 DIAGNOSIS — J410 Simple chronic bronchitis: Secondary | ICD-10-CM

## 2014-06-24 DIAGNOSIS — Z720 Tobacco use: Secondary | ICD-10-CM

## 2014-06-24 DIAGNOSIS — Z951 Presence of aortocoronary bypass graft: Secondary | ICD-10-CM

## 2014-06-24 LAB — LIPID PANEL
CHOLESTEROL: 169 mg/dL (ref 0–200)
HDL: 70 mg/dL (ref >39)
LDL Cholesterol: 89 mg/dL (ref 0–99)
Total CHOL/HDL Ratio: 2.4 Ratio
Triglycerides: 48 mg/dL (ref <150)
VLDL: 10 mg/dL (ref 0–40)

## 2014-06-24 LAB — HEPATIC FUNCTION PANEL
ALK PHOS: 62 U/L (ref 39–117)
ALT: 15 U/L (ref 0–53)
AST: 26 U/L (ref 0–37)
Albumin: 4 g/dL (ref 3.5–5.2)
BILIRUBIN TOTAL: 0.7 mg/dL (ref 0.2–1.2)
Bilirubin, Direct: 0.2 mg/dL (ref 0.0–0.3)
Indirect Bilirubin: 0.5 mg/dL (ref 0.2–1.2)
Total Protein: 6.7 g/dL (ref 6.0–8.3)

## 2014-06-24 LAB — CBC WITH DIFFERENTIAL/PLATELET
BASOS ABS: 0 10*3/uL (ref 0.0–0.1)
BASOS PCT: 0 % (ref 0–1)
Eosinophils Absolute: 0 10*3/uL (ref 0.0–0.7)
Eosinophils Relative: 0 % (ref 0–5)
HEMATOCRIT: 37.2 % — AB (ref 39.0–52.0)
HEMOGLOBIN: 12.8 g/dL — AB (ref 13.0–17.0)
LYMPHS PCT: 24 % (ref 12–46)
Lymphs Abs: 1.9 10*3/uL (ref 0.7–4.0)
MCH: 31.4 pg (ref 26.0–34.0)
MCHC: 34.4 g/dL (ref 30.0–36.0)
MCV: 91.4 fL (ref 78.0–100.0)
MONO ABS: 1 10*3/uL (ref 0.1–1.0)
MPV: 9.3 fL (ref 8.6–12.4)
Monocytes Relative: 13 % — ABNORMAL HIGH (ref 3–12)
NEUTROS ABS: 5 10*3/uL (ref 1.7–7.7)
Neutrophils Relative %: 63 % (ref 43–77)
PLATELETS: 239 10*3/uL (ref 150–400)
RBC: 4.07 MIL/uL — AB (ref 4.22–5.81)
RDW: 13.9 % (ref 11.5–15.5)
WBC: 8 10*3/uL (ref 4.0–10.5)

## 2014-06-24 LAB — BASIC METABOLIC PANEL
BUN: 10 mg/dL (ref 6–23)
CO2: 24 meq/L (ref 19–32)
Calcium: 8.8 mg/dL (ref 8.4–10.5)
Chloride: 96 mEq/L (ref 96–112)
Creat: 0.83 mg/dL (ref 0.50–1.35)
GLUCOSE: 98 mg/dL (ref 70–99)
Potassium: 5.1 mEq/L (ref 3.5–5.3)
Sodium: 129 mEq/L — ABNORMAL LOW (ref 135–145)

## 2014-06-24 NOTE — Patient Instructions (Signed)
We will check lab work today  Continue your current therapy  I will see you in 6 months  Try and quit smoking completely

## 2014-06-24 NOTE — Progress Notes (Signed)
Cory Dennis Date of Birth: [birth year] Medical Record #[id number]  History of Present Illness: Cory Dennis is seen today for followup of coronary disease. He is status post CABG in February 2011 of the [place]. He had a left ventricular aneurysm repair. Ejection fraction was 30%. Since his heart surgery he has had nonunion of the left parasternal region. He was evaluated here by Dr. Donata Clay who did not recommend surgery for his sternum.  On follow up today he reports he had a recent "bug". Some cough with clear phlegm. No fever. Still has chronic sternal pain but no angina. Breathing is stable. No edema or palpitations. States he hasn't been seen at the [place] in over a year. We had to refill his meds.  Current Outpatient Prescriptions on File Prior to Visit  Medication Sig Dispense Refill  . albuterol (PROVENTIL HFA;VENTOLIN HFA) 108 (90 BASE) MCG/ACT inhaler Inhale 2 puffs into the lungs every 6 (six) hours as needed.    Marland Kitchen aspirin 81 MG tablet Take 81 mg by mouth daily.    . B Complex-Folic Acid (B COMPLEX-VITAMIN B12) TABS Take by mouth.    . folic acid (FOLVITE) 1 MG tablet Take 1 tablet (1 mg total) by mouth daily. 30 tablet 6  . HYDROcodone-acetaminophen (LORTAB) 10-500 MG per tablet Take 1 tablet by mouth every 6 (six) hours as needed for pain.    Marland Kitchen lisinopril (PRINIVIL,ZESTRIL) 20 MG tablet Take 1 tablet (20 mg total) by mouth daily. 30 tablet 6  . metoprolol (LOPRESSOR) 50 MG tablet Take 50 mg by mouth 2 (two) times daily.    . simvastatin (ZOCOR) 80 MG tablet Take 80 mg by mouth at bedtime.    Marland Kitchen spironolactone (ALDACTONE) 25 MG tablet Take 1 tablet (25 mg total) by mouth daily.     No current facility-administered medications on file prior to visit.    Allergies  Allergen Reactions  . Penicillins Other (See Comments)    Doesn't remember    Past Medical History  Diagnosis Date  . Myocardial infarction   . Musculoskeletal chest pain   . Fatigue   . SOB (shortness of  breath)   . Arthritis   . Anxiety   . Depression   . Syncope   . Ischemic cardiomyopathy   . COPD (chronic obstructive pulmonary disease)   . Hypertension   . Tobacco abuse   . Alcohol abuse   . CAD (coronary artery disease)     Past Surgical History  Procedure Laterality Date  . Coronary artery bypass graft  2011    History  Smoking status  . Current Every Day Smoker -- 1.00 packs/day for 42 years  . Types: Cigarettes  Smokeless tobacco  . Never Used    History  Alcohol Use  . Yes    Comment: 5-6 beers per day    History reviewed. No pertinent family history.  Review of Systems: The review of systems is positive for musculoskeletal chest pain.  All other systems were reviewed and are negative.  Physical Exam: BP 130/80 mmHg  Pulse 88  Ht  (1.626 m)  Wt 155 lb 14.4 oz (70.716 kg)  BMI 26.75 kg/m2 He is a chronically ill appearing white male in no acute distress. HEENT exam is unremarkable.  Neck is supple without JVD, adenopathy, thyromegaly, or bruits. Lungs reveal scattered  rhonchi. Cardiac exam reveals a regular rate and rhythm without gallop, murmur, or click. Chest wall reveals a median sternotomy scar. There is  nonunion of the lower left parasternal region with  tenderness to palpation. There is a small ventral hernia in the subxiphoid area. Abdomen is soft and nontender without masses or bruits. There is no hepatosplenomegaly. Femoral and pedal pulses are 2+ and symmetric. He has no edema. Skin is warm and dry. He is alert and oriented x3. Cranial nerves II through XII are intact.  LABORATORY DATA: Ecg today shows NSR with old anterolateral MI. Low voltage. No change from October 2014.   Assessment / Plan: 1. Coronary disease status post CABG in 2011. Clinically stable.  2. Chronic congestive heart failure with ischemic cardiomyopathy. Ejection fraction 35%. Patient has deferred to ICD placement. He is on appropriate medical therapy with ACE inhibitor,  beta blocker, and Aldactone and appears euvolemic. Will check follow up lab work today including chemistries, lipids, and CBC.  3. 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  4. Tobacco abuse. Patient counseled on smoking cessation. He has reduced his use by half. Encourage complete cessation.  5. COPD with chronic bronchitis. Recent viral URI. No antibiotics needed now. Recommend he use his inhalers and stop smoking.

## 2014-07-23 ENCOUNTER — Encounter: Payer: Self-pay | Admitting: Family Medicine

## 2014-08-05 ENCOUNTER — Encounter: Payer: Self-pay | Admitting: Physician Assistant

## 2014-08-05 ENCOUNTER — Ambulatory Visit (INDEPENDENT_AMBULATORY_CARE_PROVIDER_SITE_OTHER): Payer: Medicare Other | Admitting: Physician Assistant

## 2014-08-05 VITALS — BP 126/64 | HR 72 | Temp 97.6°F | Resp 18 | Ht 64.0 in | Wt 151.0 lb

## 2014-08-05 DIAGNOSIS — I255 Ischemic cardiomyopathy: Secondary | ICD-10-CM

## 2014-08-05 DIAGNOSIS — E871 Hypo-osmolality and hyponatremia: Secondary | ICD-10-CM | POA: Diagnosis not present

## 2014-08-05 DIAGNOSIS — Z720 Tobacco use: Secondary | ICD-10-CM

## 2014-08-05 DIAGNOSIS — Z951 Presence of aortocoronary bypass graft: Secondary | ICD-10-CM

## 2014-08-05 DIAGNOSIS — J410 Simple chronic bronchitis: Secondary | ICD-10-CM

## 2014-08-05 DIAGNOSIS — D649 Anemia, unspecified: Secondary | ICD-10-CM | POA: Diagnosis not present

## 2014-08-05 DIAGNOSIS — Z79899 Other long term (current) drug therapy: Secondary | ICD-10-CM | POA: Diagnosis not present

## 2014-08-05 DIAGNOSIS — I2510 Atherosclerotic heart disease of native coronary artery without angina pectoris: Secondary | ICD-10-CM | POA: Diagnosis not present

## 2014-08-05 MED ORDER — VARENICLINE TARTRATE 0.5 MG PO TABS: 0.5000 mg | ORAL_TABLET | Freq: Two times a day (BID) | ORAL | Status: DC

## 2014-08-05 MED ORDER — VARENICLINE TARTRATE 1 MG PO TABS: 1.0000 mg | ORAL_TABLET | Freq: Two times a day (BID) | ORAL | Status: DC

## 2014-08-05 NOTE — Progress Notes (Signed)
Patient ID: Cory Dennis MRN: [id number], DOB: [birth year], 69 y.o. Date of Encounter: @  Chief Complaint:  Chief Complaint  Patient presents with  . Anemia    HPI: 69 y.o. year old white male  presents as a new patient to establish care.  He goes to the [facility].  for his primary medical care.  He sees Dr. Swaziland for Cardiology.  He says that "[facility]."  Says that he recently saw Dr. Swaziland and told him that the [facility] had done no labs in a long time and had not scheduled him routine follow-up with a primary provider in a long time. He states that Dr. Swaziland told him to get his own private PCP.  He also says that the [place] did his CABG surgery. He has had a lot of problems with his sternum since the surgery. Says that the [facility] was going [facility]. Says that he had a second opinion by Dr. Donata Clay told him not to have repeat surgery. Patient says he really does not trust the [place] doctors.  Patient states that Dr. Swaziland also recently checked labs that showed anemia and Dr. Swaziland told him to get him a private PCP to follow up this with.  Patient says that he hates going to the [facility] and does not get good care there. At one point in our conversation, he mentions just following up here and stopping follow-up with the [place].  However at different part of the conversation he says that he does get his medications for free through the [place] and that he was planning to request a transfer to the [place] in [place]-- that he just found out that they had opened a [place] office in [place]  a couple weeks ago.  He says that he had no other specific concerns to be addressed today.    Past Medical History  Diagnosis Date  . Myocardial infarction   . Musculoskeletal chest pain   . Fatigue   . SOB (shortness of breath)   . Arthritis   . Anxiety   . Depression   . Syncope   . Ischemic cardiomyopathy   . COPD (chronic obstructive  pulmonary disease)   . Hypertension   . Tobacco abuse   . Alcohol abuse   . CAD (coronary artery disease)   . Anemia   . Emphysema of lung      Home Meds: Outpatient Prescriptions Prior to Visit  Medication Sig Dispense Refill  . albuterol (PROVENTIL HFA;VENTOLIN HFA) 108 (90 BASE) MCG/ACT inhaler Inhale 2 puffs into the lungs every 6 (six) hours as needed.    Marland Kitchen aspirin 81 MG tablet Take 81 mg by mouth daily.    . folic acid (FOLVITE) 1 MG tablet Take 1 tablet (1 mg total) by mouth daily. 30 tablet 6  . HYDROcodone-acetaminophen (LORTAB) 10-500 MG per tablet Take 1 tablet by mouth every 6 (six) hours as needed for pain.    Marland Kitchen lisinopril (PRINIVIL,ZESTRIL) 20 MG tablet Take 1 tablet (20 mg total) by mouth daily. 30 tablet 6  . metoprolol (LOPRESSOR) 50 MG tablet Take 50 mg by mouth 2 (two) times daily.    . simvastatin (ZOCOR) 80 MG tablet Take 80 mg by mouth at bedtime.    Marland Kitchen spironolactone (ALDACTONE) 25 MG tablet Take 1 tablet (25 mg total) by mouth daily. (Patient not taking: Reported on 08/05/2014)    . B Complex-Folic Acid (  B COMPLEX-VITAMIN B12) TABS Take by mouth.    Marland Kitchen FLUARIX QUADRIVALENT 0.5 ML injection   0   No facility-administered medications prior to visit.    Allergies:  Allergies  Allergen Reactions  . Penicillins Other (See Comments)    Doesn't remember    History   Social History  . Marital Status: Single    Spouse Name: N/A  . Number of Children: N/A  . Years of Education: N/A   Occupational History  . Mechanic    Social History Main Topics  . Smoking status: Current Every Day Smoker -- 1.00 packs/day for 42 years    Types: Cigarettes  . Smokeless tobacco: Never Used     Comment: working on it  . Alcohol Use: 12.0 - 14.4 oz/week    20-24 Cans of beer per week     Comment: 5-6 beers per day  . Drug Use: No  . Sexual Activity: Not Currently   Other Topics Concern  . Not on file   Social History Narrative    History reviewed. No pertinent  family history.   Review of Systems:  See HPI for pertinent ROS. All other ROS negative.    Physical Exam: Blood pressure 126/64, pulse 72, temperature 97.6 F (36.4 C), temperature source Oral, resp. rate 18, height 5\' 4"  (1.626 m), weight 151 lb (68.493 kg)., Body mass index is 25.91 kg/(m^2). General: WNWD WM. Appears in no acute distress. Neck: Supple. No thyromegaly. No lymphadenopathy. No bruits. Lungs: Scattered Rhonchi Heart: RRR with S1 S2. No murmurs, rubs, or gallops. Abdomen: Soft, non-tender, non-distended with normoactive bowel sounds. No hepatomegaly. No rebound/guarding. No obvious abdominal masses. Musculoskeletal:  Strength and tone normal for age. Extremities/Skin: Warm and dry. No edema.  Neuro: Alert and oriented X 3. Moves all extremities spontaneously. Gait is normal. CNII-XII grossly in tact. Psych:  Responds to questions appropriately with a normal affect.     ASSESSMENT AND PLAN:  69 y.o. year old male with  1. Anemia, unspecified anemia type Reviewed recent labs performed by Dr. Swaziland which are found in epic. Patient had some mild anemia with hemoglobin 12.8 hematocrit 37.2. Was normocytic, normochromic. We'll check full anemia panel. But the fact that it's normochromic normocytic is consistent with anemia of chronic disease. Then reviewed that he is a smoker. He says that he currently is smoking about one third pack per day. Decreased to this amount since January. That in January he was smoking 1 pack per day. 50+ years of smoking--has smoked since a teen. - Anemia panel  2. Tobacco abuse See #1 above.  He says that he has decreased his smoking significantly since January and is motivated to try to quit. Asked him if he had ever used any medications to help him quit and he says no and he is agreeable to try Chantix. Told him if he has any adverse effects to stop the medication and call us immediately. Otherwise if he has no side effects he will take the  starting dose pack and then increase to the continuing Dosepak. - varenicline (CHANTIX) 0.5 MG tablet; Take 1 tablet (0.5 mg total) by mouth 2 (two) times daily.  Dispense: 60 tablet; Refill: 0 - varenicline (CHANTIX CONTINUING MONTH PAK) 1 MG tablet; Take 1 tablet (1 mg total) by mouth 2 (two) times daily.  Dispense: 60 tablet; Refill: 2  3. Ischemic cardiomyopathy  4. Coronary artery disease involving native coronary artery of native heart without angina pectoris  5. S/P CABG (coronary  artery bypass graft)  6. Simple chronic bronchitis  7. Hyponatremia   Today I had him sign a release to get records from the [facility]-----specifically for immunization records and health maintenance records including colonoscopy report. He is to schedule follow-up office visit in one month.  . Also we will be contacting him when we get the results of the anemia panel .   [address] [place], [place], [facility] 08/05/2014 3:44 PM

## 2014-08-06 LAB — ANEMIA PANEL
%SAT: 30 % (ref 20–55)
ABS Retic: 65.6 10*3/uL (ref 19.0–186.0)
FERRITIN: 184 ng/mL (ref 22–322)
Folate: >20 ng/mL
IRON: 91 ug/dL (ref 42–165)
RBC.: 4.37 MIL/uL (ref 4.22–5.81)
Retic Ct Pct: 1.5 % (ref 0.4–2.3)
TIBC: 306 ug/dL (ref 215–435)
UIBC: 215 ug/dL (ref 125–400)
Vitamin B-12: 477 pg/mL (ref 211–911)

## 2014-08-07 ENCOUNTER — Telehealth: Payer: Self-pay | Admitting: Family Medicine

## 2014-08-07 DIAGNOSIS — D638 Anemia in other chronic diseases classified elsewhere: Secondary | ICD-10-CM

## 2014-08-07 NOTE — Telephone Encounter (Signed)
-----   Message from Dorena Bodo, PA-C sent at 08/06/2014  7:31 AM EST ----- Patient had been told that he had anemia and he had his office visit yesterday to further evaluate that. Tell him that his anemia is "Anemia of Chronic Disease". He has no iron deficiency B12 deficiency folate deficiency. He has been smoking for over 50 years. At his visit yesterday I prescribed Chantix for smoking cessation. Tell him that the anemia is likely secondary to chronic lung disease.  Need smoking cessation. No other treatment now.

## 2014-08-07 NOTE — Telephone Encounter (Signed)
Pt aware of lab results.  Diagnosis added to problem list

## 2014-08-19 ENCOUNTER — Encounter: Payer: Self-pay | Admitting: Physician Assistant

## 2014-08-19 ENCOUNTER — Inpatient Hospital Stay (HOSPITAL_COMMUNITY)
Admission: EM | Admit: 2014-08-19 | Discharge: 2014-08-23 | DRG: 291 | Disposition: A | Discharge status: Home / Self Care | Payer: Medicare Other | Attending: Internal Medicine | Admitting: Internal Medicine

## 2014-08-19 ENCOUNTER — Emergency Department (HOSPITAL_COMMUNITY): Payer: Medicare Other

## 2014-08-19 ENCOUNTER — Encounter (HOSPITAL_COMMUNITY): Payer: Self-pay | Admitting: Emergency Medicine

## 2014-08-19 ENCOUNTER — Ambulatory Visit (INDEPENDENT_AMBULATORY_CARE_PROVIDER_SITE_OTHER): Payer: Medicare Other | Admitting: Physician Assistant

## 2014-08-19 VITALS — BP 116/64 | HR 76 | Temp 97.2°F | Resp 22 | Wt 156.0 lb

## 2014-08-19 DIAGNOSIS — J441 Chronic obstructive pulmonary disease with (acute) exacerbation: Secondary | ICD-10-CM | POA: Diagnosis not present

## 2014-08-19 DIAGNOSIS — Z951 Presence of aortocoronary bypass graft: Secondary | ICD-10-CM

## 2014-08-19 DIAGNOSIS — R0902 Hypoxemia: Secondary | ICD-10-CM | POA: Insufficient documentation

## 2014-08-19 DIAGNOSIS — J9601 Acute respiratory failure with hypoxia: Secondary | ICD-10-CM | POA: Diagnosis not present

## 2014-08-19 DIAGNOSIS — Z880 Allergy status to penicillin: Secondary | ICD-10-CM

## 2014-08-19 DIAGNOSIS — M1990 Unspecified osteoarthritis, unspecified site: Secondary | ICD-10-CM | POA: Diagnosis present

## 2014-08-19 DIAGNOSIS — I509 Heart failure, unspecified: Secondary | ICD-10-CM | POA: Insufficient documentation

## 2014-08-19 DIAGNOSIS — E875 Hyperkalemia: Secondary | ICD-10-CM | POA: Diagnosis not present

## 2014-08-19 DIAGNOSIS — R0602 Shortness of breath: Secondary | ICD-10-CM | POA: Diagnosis not present

## 2014-08-19 DIAGNOSIS — Z720 Tobacco use: Secondary | ICD-10-CM

## 2014-08-19 DIAGNOSIS — M7120 Synovial cyst of popliteal space [Baker], unspecified knee: Secondary | ICD-10-CM | POA: Diagnosis present

## 2014-08-19 DIAGNOSIS — I519 Heart disease, unspecified: Secondary | ICD-10-CM | POA: Diagnosis not present

## 2014-08-19 DIAGNOSIS — R600 Localized edema: Secondary | ICD-10-CM

## 2014-08-19 DIAGNOSIS — J90 Pleural effusion, not elsewhere classified: Secondary | ICD-10-CM | POA: Diagnosis not present

## 2014-08-19 DIAGNOSIS — D649 Anemia, unspecified: Secondary | ICD-10-CM | POA: Diagnosis present

## 2014-08-19 DIAGNOSIS — I5043 Acute on chronic combined systolic (congestive) and diastolic (congestive) heart failure: Secondary | ICD-10-CM | POA: Diagnosis not present

## 2014-08-19 DIAGNOSIS — F419 Anxiety disorder, unspecified: Secondary | ICD-10-CM | POA: Diagnosis present

## 2014-08-19 DIAGNOSIS — I252 Old myocardial infarction: Secondary | ICD-10-CM

## 2014-08-19 DIAGNOSIS — J449 Chronic obstructive pulmonary disease, unspecified: Secondary | ICD-10-CM | POA: Diagnosis not present

## 2014-08-19 DIAGNOSIS — I2510 Atherosclerotic heart disease of native coronary artery without angina pectoris: Secondary | ICD-10-CM | POA: Diagnosis present

## 2014-08-19 DIAGNOSIS — I255 Ischemic cardiomyopathy: Secondary | ICD-10-CM | POA: Diagnosis not present

## 2014-08-19 DIAGNOSIS — E871 Hypo-osmolality and hyponatremia: Secondary | ICD-10-CM | POA: Diagnosis not present

## 2014-08-19 DIAGNOSIS — Z7982 Long term (current) use of aspirin: Secondary | ICD-10-CM

## 2014-08-19 DIAGNOSIS — F329 Major depressive disorder, single episode, unspecified: Secondary | ICD-10-CM | POA: Diagnosis present

## 2014-08-19 DIAGNOSIS — I10 Essential (primary) hypertension: Secondary | ICD-10-CM | POA: Diagnosis present

## 2014-08-19 DIAGNOSIS — F1010 Alcohol abuse, uncomplicated: Secondary | ICD-10-CM

## 2014-08-19 DIAGNOSIS — Z7951 Long term (current) use of inhaled steroids: Secondary | ICD-10-CM | POA: Diagnosis not present

## 2014-08-19 DIAGNOSIS — F17210 Nicotine dependence, cigarettes, uncomplicated: Secondary | ICD-10-CM | POA: Diagnosis present

## 2014-08-19 DIAGNOSIS — M7989 Other specified soft tissue disorders: Secondary | ICD-10-CM | POA: Diagnosis not present

## 2014-08-19 DIAGNOSIS — E876 Hypokalemia: Secondary | ICD-10-CM | POA: Diagnosis present

## 2014-08-19 DIAGNOSIS — I501 Left ventricular failure: Secondary | ICD-10-CM | POA: Diagnosis not present

## 2014-08-19 HISTORY — DX: Heart failure, unspecified: I50.9

## 2014-08-19 LAB — CBC WITH DIFFERENTIAL/PLATELET
Basophils Absolute: 0 10*3/uL (ref 0.0–0.1)
Basophils Relative: 0 % (ref 0–1)
EOS ABS: 0 10*3/uL (ref 0.0–0.7)
Eosinophils Relative: 0 % (ref 0–5)
HCT: 36.3 % — ABNORMAL LOW (ref 39.0–52.0)
Hemoglobin: 13.1 g/dL (ref 13.0–17.0)
Lymphocytes Relative: 8 % — ABNORMAL LOW (ref 12–46)
Lymphs Abs: 0.6 10*3/uL — ABNORMAL LOW (ref 0.7–4.0)
MCH: 31.8 pg (ref 26.0–34.0)
MCHC: 36.1 g/dL — ABNORMAL HIGH (ref 30.0–36.0)
MCV: 88.1 fL (ref 78.0–100.0)
MONOS PCT: 10 % (ref 3–12)
Monocytes Absolute: 0.8 10*3/uL (ref 0.1–1.0)
NEUTROS PCT: 82 % — AB (ref 43–77)
Neutro Abs: 6.7 10*3/uL (ref 1.7–7.7)
PLATELETS: 183 10*3/uL (ref 150–400)
RBC: 4.12 MIL/uL — ABNORMAL LOW (ref 4.22–5.81)
RDW: 13.6 % (ref 11.5–15.5)
WBC: 8.1 10*3/uL (ref 4.0–10.5)

## 2014-08-19 LAB — BASIC METABOLIC PANEL
Anion gap: 11 (ref 5–15)
BUN: 11 mg/dL (ref 6–23)
CHLORIDE: 85 mmol/L — AB (ref 96–112)
CO2: 24 mmol/L (ref 19–32)
Calcium: 8.5 mg/dL (ref 8.4–10.5)
Creatinine, Ser: 1.01 mg/dL (ref 0.50–1.35)
GFR, EST AFRICAN AMERICAN: 86 mL/min — AB (ref >90)
GFR, EST NON AFRICAN AMERICAN: 74 mL/min — AB (ref >90)
Glucose, Bld: 114 mg/dL — ABNORMAL HIGH (ref 70–99)
Potassium: 4.9 mmol/L (ref 3.5–5.1)
SODIUM: 120 mmol/L — AB (ref 135–145)

## 2014-08-19 LAB — I-STAT ARTERIAL BLOOD GAS, ED
ACID-BASE DEFICIT: 4 mmol/L — AB (ref 0.0–2.0)
BICARBONATE: 20.3 meq/L (ref 20.0–24.0)
O2 SAT: 94 %
PH ART: 7.403 (ref 7.350–7.450)
TCO2: 21 mmol/L (ref 0–100)
pCO2 arterial: 32.2 mmHg — ABNORMAL LOW (ref 35.0–45.0)
pO2, Arterial: 65 mmHg — ABNORMAL LOW (ref 80.0–100.0)

## 2014-08-19 LAB — I-STAT CHEM 8, ED
BUN: 11 mg/dL (ref 6–23)
CHLORIDE: 85 mmol/L — AB (ref 96–112)
Calcium, Ion: 1.09 mmol/L — ABNORMAL LOW (ref 1.13–1.30)
Creatinine, Ser: 0.8 mg/dL (ref 0.50–1.35)
Glucose, Bld: 119 mg/dL — ABNORMAL HIGH (ref 70–99)
HEMATOCRIT: 42 % (ref 39.0–52.0)
Hemoglobin: 14.3 g/dL (ref 13.0–17.0)
Potassium: 5.4 mmol/L — ABNORMAL HIGH (ref 3.5–5.1)
Sodium: 119 mmol/L — CL (ref 135–145)
TCO2: 20 mmol/L (ref 0–100)

## 2014-08-19 LAB — BRAIN NATRIURETIC PEPTIDE: B NATRIURETIC PEPTIDE 5: 3689.7 pg/mL — AB (ref 0.0–100.0)

## 2014-08-19 LAB — STREP PNEUMONIAE URINARY ANTIGEN: Strep Pneumo Urinary Antigen: NEGATIVE

## 2014-08-19 LAB — MRSA PCR SCREENING: MRSA by PCR: NEGATIVE

## 2014-08-19 LAB — I-STAT TROPONIN, ED: TROPONIN I, POC: 0.01 ng/mL (ref 0.00–0.08)

## 2014-08-19 LAB — TROPONIN I: TROPONIN I: 0.05 ng/mL — AB (ref <0.031)

## 2014-08-19 MED ORDER — LORAZEPAM 2 MG/ML IJ SOLN
0.0000 mg | Freq: Two times a day (BID) | INTRAMUSCULAR | Status: DC
  Administered 2014-08-21: 1 mg via INTRAVENOUS
  Administered 2014-08-21: 2 mg via INTRAVENOUS
  Filled 2014-08-19: qty 1

## 2014-08-19 MED ORDER — FOLIC ACID 1 MG PO TABS
1.0000 mg | ORAL_TABLET | Freq: Every day | ORAL | Status: DC
  Administered 2014-08-20 – 2014-08-23 (×4): 1 mg via ORAL
  Filled 2014-08-19 (×4): qty 1

## 2014-08-19 MED ORDER — ONDANSETRON HCL 4 MG/2ML IJ SOLN: 4.0000 mg | Freq: Four times a day (QID) | INTRAMUSCULAR | Status: DC | PRN

## 2014-08-19 MED ORDER — ATORVASTATIN CALCIUM 40 MG PO TABS
40.0000 mg | ORAL_TABLET | Freq: Every day | ORAL | Status: DC
  Administered 2014-08-19 – 2014-08-22 (×4): 40 mg via ORAL
  Filled 2014-08-19 (×5): qty 1

## 2014-08-19 MED ORDER — ACETAMINOPHEN 325 MG PO TABS: 650.0000 mg | ORAL_TABLET | ORAL | Status: DC | PRN

## 2014-08-19 MED ORDER — DM-GUAIFENESIN ER 30-600 MG PO TB12
1.0000 | ORAL_TABLET | Freq: Two times a day (BID) | ORAL | Status: DC
  Administered 2014-08-19 – 2014-08-20 (×2): 1 via ORAL
  Filled 2014-08-19 (×3): qty 1

## 2014-08-19 MED ORDER — ASPIRIN 81 MG PO TABS: 81.0000 mg | ORAL_TABLET | Freq: Every day | ORAL | Status: DC

## 2014-08-19 MED ORDER — ALBUTEROL SULFATE (2.5 MG/3ML) 0.083% IN NEBU: 2.5000 mg | INHALATION_SOLUTION | RESPIRATORY_TRACT | Status: DC | PRN

## 2014-08-19 MED ORDER — METHYLPREDNISOLONE SODIUM SUCC 125 MG IJ SOLR
60.0000 mg | Freq: Every day | INTRAMUSCULAR | Status: DC
  Administered 2014-08-19: 60 mg via INTRAVENOUS
  Filled 2014-08-19: qty 2
  Filled 2014-08-19: qty 0.96

## 2014-08-19 MED ORDER — FUROSEMIDE 10 MG/ML IJ SOLN
40.0000 mg | Freq: Two times a day (BID) | INTRAMUSCULAR | Status: DC
  Administered 2014-08-20 – 2014-08-22 (×5): 40 mg via INTRAVENOUS
  Filled 2014-08-19 (×9): qty 4

## 2014-08-19 MED ORDER — FOLIC ACID 1 MG PO TABS
1.0000 mg | ORAL_TABLET | Freq: Every day | ORAL | Status: DC
Start: 2014-08-19 — End: 2014-08-19

## 2014-08-19 MED ORDER — HYDROCODONE-ACETAMINOPHEN 5-325 MG PO TABS
1.0000 | ORAL_TABLET | ORAL | Status: DC | PRN
Start: 2014-08-19 — End: 2014-08-23

## 2014-08-19 MED ORDER — LORAZEPAM 2 MG/ML IJ SOLN
0.0000 mg | Freq: Four times a day (QID) | INTRAMUSCULAR | Status: AC
  Administered 2014-08-19 – 2014-08-20 (×2): 1 mg via INTRAVENOUS
  Filled 2014-08-19 (×3): qty 1

## 2014-08-19 MED ORDER — LISINOPRIL 20 MG PO TABS
20.0000 mg | ORAL_TABLET | Freq: Every day | ORAL | Status: DC
  Filled 2014-08-19: qty 1

## 2014-08-19 MED ORDER — IPRATROPIUM-ALBUTEROL 0.5-2.5 (3) MG/3ML IN SOLN
3.0000 mL | RESPIRATORY_TRACT | Status: DC
Start: 2014-08-19 — End: 2014-08-23
  Administered 2014-08-19 – 2014-08-23 (×23): 3 mL via RESPIRATORY_TRACT
  Filled 2014-08-19 (×22): qty 3

## 2014-08-19 MED ORDER — FUROSEMIDE 10 MG/ML IJ SOLN
40.0000 mg | Freq: Once | INTRAMUSCULAR | Status: AC
  Administered 2014-08-19: 40 mg via INTRAVENOUS
  Filled 2014-08-19: qty 4

## 2014-08-19 MED ORDER — SODIUM CHLORIDE 0.9 % IJ SOLN
3.0000 mL | Freq: Two times a day (BID) | INTRAMUSCULAR | Status: DC
  Administered 2014-08-19 – 2014-08-23 (×7): 3 mL via INTRAVENOUS

## 2014-08-19 MED ORDER — THIAMINE HCL 100 MG/ML IJ SOLN
100.0000 mg | Freq: Every day | INTRAMUSCULAR | Status: DC
  Filled 2014-08-19 (×5): qty 1

## 2014-08-19 MED ORDER — IPRATROPIUM-ALBUTEROL 0.5-2.5 (3) MG/3ML IN SOLN
3.0000 mL | Freq: Once | RESPIRATORY_TRACT | Status: AC
  Administered 2014-08-19: 3 mL via RESPIRATORY_TRACT
  Filled 2014-08-19: qty 3

## 2014-08-19 MED ORDER — LORAZEPAM 2 MG/ML IJ SOLN: 1.0000 mg | Freq: Four times a day (QID) | INTRAMUSCULAR | Status: AC | PRN

## 2014-08-19 MED ORDER — IOHEXOL 350 MG/ML SOLN
80.0000 mL | Freq: Once | INTRAVENOUS | Status: AC | PRN
  Administered 2014-08-19: 80 mL via INTRAVENOUS

## 2014-08-19 MED ORDER — SODIUM CHLORIDE 0.9 % IV SOLN: 250.0000 mL | INTRAVENOUS | Status: DC | PRN

## 2014-08-19 MED ORDER — LORAZEPAM 1 MG PO TABS
1.0000 mg | ORAL_TABLET | Freq: Four times a day (QID) | ORAL | Status: AC | PRN
  Administered 2014-08-21: 1 mg via ORAL
  Filled 2014-08-19: qty 1

## 2014-08-19 MED ORDER — ADULT MULTIVITAMIN W/MINERALS CH
1.0000 | ORAL_TABLET | Freq: Every day | ORAL | Status: DC
  Administered 2014-08-19 – 2014-08-23 (×5): 1 via ORAL
  Filled 2014-08-19 (×5): qty 1

## 2014-08-19 MED ORDER — HEPARIN SODIUM (PORCINE) 5000 UNIT/ML IJ SOLN
5000.0000 [IU] | Freq: Three times a day (TID) | INTRAMUSCULAR | Status: DC
  Administered 2014-08-19 – 2014-08-23 (×10): 5000 [IU] via SUBCUTANEOUS
  Filled 2014-08-19 (×13): qty 1

## 2014-08-19 MED ORDER — SODIUM CHLORIDE 0.9 % IJ SOLN
3.0000 mL | INTRAMUSCULAR | Status: DC | PRN
  Administered 2014-08-22 (×2): 3 mL via INTRAVENOUS
  Filled 2014-08-19 (×2): qty 3

## 2014-08-19 MED ORDER — VITAMIN B-1 100 MG PO TABS
100.0000 mg | ORAL_TABLET | Freq: Every day | ORAL | Status: DC
  Administered 2014-08-19 – 2014-08-23 (×5): 100 mg via ORAL
  Filled 2014-08-19 (×5): qty 1

## 2014-08-19 MED ORDER — METOPROLOL TARTRATE 50 MG PO TABS
50.0000 mg | ORAL_TABLET | Freq: Two times a day (BID) | ORAL | Status: DC
  Administered 2014-08-19 – 2014-08-22 (×6): 50 mg via ORAL
  Filled 2014-08-19 (×7): qty 1

## 2014-08-19 MED ORDER — ASPIRIN 81 MG PO CHEW
81.0000 mg | CHEWABLE_TABLET | Freq: Every day | ORAL | Status: DC
  Administered 2014-08-20 – 2014-08-23 (×4): 81 mg via ORAL
  Filled 2014-08-19 (×4): qty 1

## 2014-08-19 MED ORDER — NICOTINE 21 MG/24HR TD PT24
21.0000 mg | MEDICATED_PATCH | Freq: Every day | TRANSDERMAL | Status: DC
  Administered 2014-08-19 – 2014-08-22 (×2): 21 mg via TRANSDERMAL
  Filled 2014-08-19 (×5): qty 1

## 2014-08-19 NOTE — ED Notes (Signed)
Notified Dr. Jeraldine Loots and first nurse of i-stat chem 8 result

## 2014-08-19 NOTE — ED Provider Notes (Signed)
CSN: [id number]     Arrival date & time 08/19/14  1511 History   First MD Initiated Contact with Patient 08/19/14 1635     Chief Complaint  Patient presents with  . Shortness of Breath     (Consider location/radiation/quality/duration/timing/severity/associated sxs/prior Treatment) HPI The patient reports that he has had his first doctor's appointment today these had a long time. He was sent immediately to the emergency department for further assessment. He reports that since starting Chantix 2 weeks ago he has been becoming increasingly short of breath. He reports he coughs and brings up clear mucus. He denies any blood in the sputum. He denies chest pain or fever. On physical exam he notably has a more swollen left lower extremity. The patient reports he started to notice that 2 weeks ago as well. He denies any vomiting or diarrhea. He reports he was a long-time smoker just up until very recently. He reports he drinks about 6-8 beers a day. Past Medical History  Diagnosis Date  . Myocardial infarction   . Musculoskeletal chest pain   . Fatigue   . SOB (shortness of breath)   . Arthritis   . Anxiety   . Depression   . Syncope   . Ischemic cardiomyopathy   . COPD (chronic obstructive pulmonary disease)   . Hypertension   . Tobacco abuse   . Alcohol abuse   . CAD (coronary artery disease)   . Anemia   . Emphysema of lung   . CHF (congestive heart failure)    Past Surgical History  Procedure Laterality Date  . Coronary artery bypass graft  2011  . Cardiac surgery     No family history on file. History  Substance Use Topics  . Smoking status: Current Every Day Smoker -- 1.00 packs/day for 42 years    Types: Cigarettes  . Smokeless tobacco: Never Used     Comment: working on it  . Alcohol Use: 12.0 - 14.4 oz/week    20-24 Cans of beer per week     Comment: 5-6 beers per day    Review of Systems  10 Systems reviewed and are negative for acute change except as noted in  the HPI.   Allergies  Penicillins  Home Medications   Prior to Admission medications   Medication Sig Start Date End Date Taking? Authorizing Provider  albuterol (PROVENTIL HFA;VENTOLIN HFA) 108 (90 BASE) MCG/ACT inhaler Inhale 2 puffs into the lungs every 6 (six) hours as needed for shortness of breath.    Yes Historical Provider, MD  aspirin 81 MG tablet Take 81 mg by mouth daily.   Yes Historical Provider, MD  budesonide-formoterol (SYMBICORT) 160-4.5 MCG/ACT inhaler Inhale 2 puffs into the lungs 2 (two) times daily.   Yes Historical Provider, MD  folic acid (FOLVITE) 1 MG tablet Take 1 tablet (1 mg total) by mouth daily. 02/25/14  Yes Peter M Swaziland, MD  HYDROcodone-acetaminophen (LORTAB) 10-500 MG per tablet Take 1 tablet by mouth every 6 (six) hours as needed for pain.   Yes Historical Provider, MD  lisinopril (PRINIVIL,ZESTRIL) 20 MG tablet Take 1 tablet (20 mg total) by mouth daily. 02/25/14  Yes Peter M Swaziland, MD  metoprolol (LOPRESSOR) 50 MG tablet Take 50 mg by mouth 2 (two) times daily.   Yes Historical Provider, MD  simvastatin (ZOCOR) 80 MG tablet Take 80 mg by mouth at bedtime.   Yes Historical Provider, MD  spironolactone (ALDACTONE) 25 MG tablet Take 1 tablet (25 mg total) by  mouth daily. Patient not taking: Reported on 08/05/2014 12/28/11   Peter M Swaziland, MD  varenicline (CHANTIX CONTINUING MONTH PAK) 1 MG tablet Take 1 tablet (1 mg total) by mouth 2 (two) times daily. Patient not taking: Reported on 08/19/2014 08/05/14   Dorena Bodo, PA-C  varenicline (CHANTIX) 0.5 MG tablet Take 1 tablet (0.5 mg total) by mouth 2 (two) times daily. Patient not taking: Reported on 08/19/2014 08/05/14   Dorena Bodo, PA-C   BP 165/92 mmHg  Pulse 93  Temp(Src) 97.9 F (36.6 C) (Oral)  Resp 23  Wt 157 lb 9.6 oz (71.487 kg)  SpO2 95% Physical Exam  Constitutional: He is oriented to person, place, and time.  The patient is moderately dyspneic at rest. He is alert and appropriate. His color is  good. The patient is nontoxic.  HENT:  Head: Normocephalic and atraumatic.  Eyes: EOM are normal. Pupils are equal, round, and reactive to light.  Neck: Neck supple.  Cardiovascular:  Heart sounds are distant and predominantly obscured by respiratory noise. Radial pulses are 2+.  Pulmonary/Chest:  Patient has moderate increased work of breathing. He has wet cough. The patient has both rhonchi and coarse wheeze throughout the lung fields.  Abdominal: Soft. He exhibits no distension. There is no tenderness.  Musculoskeletal: Normal range of motion. He exhibits edema.  Patient asymmetric edema approximately 2+ to 3+ of the left lower extremity. He has proximal and 1+ edema of the right lower extremity. There is mild diffuse erythema associated with the left lower extremity.  Neurological: He is alert and oriented to person, place, and time. He exhibits normal muscle tone. Coordination normal.  Skin: Skin is warm and dry.  Psychiatric: He has a normal mood and affect.    ED Course  Procedures (including critical care time) Labs Review Labs Reviewed  CBC WITH DIFFERENTIAL/PLATELET - Abnormal; Notable for the following:    RBC 4.12 (*)    HCT 36.3 (*)    MCHC 36.1 (*)    Neutrophils Relative % 82 (*)    Lymphocytes Relative 8 (*)    Lymphs Abs 0.6 (*)    All other components within normal limits  BRAIN NATRIURETIC PEPTIDE - Abnormal; Notable for the following:    B Natriuretic Peptide 3689.7 (*)    All other components within normal limits  I-STAT CHEM 8, ED - Abnormal; Notable for the following:    Sodium 119 (*)    Potassium 5.4 (*)    Chloride 85 (*)    Glucose, Bld 119 (*)    Calcium, Ion 1.09 (*)    All other components within normal limits  I-STAT ARTERIAL BLOOD GAS, ED - Abnormal; Notable for the following:    pCO2 arterial 32.2 (*)    pO2, Arterial 65.0 (*)    Acid-base deficit 4.0 (*)    All other components within normal limits  I-STAT TROPOININ, ED    Imaging  Review Dg Chest 2 View  08/19/2014   CLINICAL DATA:  Shortness of breath since starting new medication on 08/06/2014, COPD, smoker, coronary disease post MI and CABG, hypertension, ischemic cardiomyopathy  EXAM: CHEST  2 VIEW  COMPARISON:  01/19/2012  FINDINGS: Enlargement of cardiac silhouette post CABG.  Slight vascular congestion.  Bibasilar effusions and atelectasis greater on RIGHT, new.  Minimal perihilar pulmonary edema.  No pneumothorax.  Bones demineralized.  IMPRESSION: Mild CHF with bibasilar effusions and atelectasis greater on RIGHT.   Electronically Signed   By: Ulyses Southward  M.D.   On: 08/19/2014 16:53   Ct Angio Chest Pe W/cm &/or Wo Cm  08/19/2014   CLINICAL DATA:  Short of breath for 2 weeks. Lower extremity swelling.  EXAM: CT ANGIOGRAPHY CHEST WITH CONTRAST  TECHNIQUE: Multidetector CT imaging of the chest was performed using the standard protocol during bolus administration of intravenous contrast. Multiplanar CT image reconstructions and MIPs were obtained to evaluate the vascular anatomy.  CONTRAST:  80mL OMNIPAQUE IOHEXOL 350 MG/ML SOLN  COMPARISON:  Chest radiograph 08/19/2014.  Chest CT 01/31/2012.  FINDINGS: Bones: Age indeterminate but new T2 compression fracture with about 20% loss of anterior vertebral body height and no retropulsion. Sclerosis along the superior endplate suggests healing and chronicity. No paravertebral phlegmon. 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  Cardiovascular: Technically adequate study for evaluation of pulmonary embolism. Negative for pulmonary embolism. Aortic atherosclerosis. Median sternotomy/ CABG. Global cardiomegaly. Mild respiratory motion degrades evaluation of the lung bases.  Lungs: Collapse/ consolidation of the lower lobes and RIGHT middle lobe. This is most compatible with compressive atelectasis associated with the pleural effusions. Interlobular septal thickening is present consistent with interstitial pulmonary  edema. There is airspace disease at the apices bilaterally, compatible with alveolar edema.  Central airways: Patent.  Effusions: Moderate RIGHT and small LEFT pleural effusion. RIGHT pleural effusion tracks anteriorly along the RIGHT middle lobe with associated compressive atelectasis.  Small amount of pericardial fluid or thickening.  Lymphadenopathy: No axillary adenopathy. Small mediastinal lymph nodes are present which are probably congestive given the other findings.  Esophagus: Grossly normal.  Upper abdomen: Within normal limits.  Other: None.  Review of the MIP images confirms the above findings.  IMPRESSION: 1. Negative for pulmonary embolism. 2. Moderate CHF with RIGHT-greater-than- LEFT bilateral pleural effusions, interstitial and mild apical alveolar pulmonary edema. 3. Cardiomegaly, again consistent with CHF. 4. Age indeterminate but likely chronic T2 superior endplate compression fracture. This is new compared to CT 2013.   Electronically Signed   By: Andreas Newport M.D.   On: 08/19/2014 18:50     EKG Interpretation   Date/Time:  Wednesday August 19 2014 16:00:18 EDT Ventricular Rate:  77 PR Interval:  156 QRS Duration: 102 QT Interval:  398 QTC Calculation: 450 R Axis:   97 Text Interpretation:  Normal sinus rhythm Possible Left atrial enlargement  Anterolateral infarct , age undetermined Abnormal ECG agree. no change  Confirmed by Donnald Garre, MD, Lebron Conners [phone]) on 08/19/2014 4:56:54 PM     CRITICAL CARE Performed by: Arby Barrette   Total critical care time: 45  Critical care time was exclusive of separately billable procedures and treating other patients.  Critical care was necessary to treat or prevent imminent or life-threatening deterioration.  Critical care was time spent personally by me on the following activities: development of treatment plan with patient and/or surrogate as well as nursing, discussions with consultants, evaluation of patient's response to  treatment, examination of patient, obtaining history from patient or surrogate, ordering and performing treatments and interventions, ordering and review of laboratory studies, ordering and review of radiographic studies, pulse oximetry and re-evaluation of patient's condition. MDM   Final diagnoses:  Acute congestive heart failure, unspecified congestive heart failure type  Hypoxia   The patient presents with increasing shortness of breath for 2 weeks duration. He had asymmetric lower extremity swelling. PE study however indicates no PE. He does have confirmed CHF with pleural effusion. The patient's mental status remains clear however he has moderate increased work of breathing  with hypoxia. The patient is placed on BiPAP for congestive heart failure with respiratory distress. He will be admitted for ongoing treatment. He denies he's had chest pain in association with this. He does have known cardiac history. His initial troponin is in the normal range.    Arby Barrette, MD 08/19/14 [phone]

## 2014-08-19 NOTE — ED Notes (Signed)
Pt off unit with CT 

## 2014-08-19 NOTE — ED Notes (Signed)
Belongings transferred to [facility] with pt.

## 2014-08-19 NOTE — ED Notes (Signed)
repiratory at bedside with bi pap.

## 2014-08-19 NOTE — ED Notes (Signed)
[facility] contacted for transport

## 2014-08-19 NOTE — ED Notes (Signed)
Ct called to take client when ready.

## 2014-08-19 NOTE — ED Notes (Signed)
Pt c/o shortness of breath x's 2 weeks, also c/o swelling in left lower leg.  Pt st's symptoms started after he started taking Chantix   Pt sent to ED from his MD's office

## 2014-08-19 NOTE — H&P (Addendum)
[facility] History and Physical  Cory Dennis BTD:[id number] DOB: 1945-09-18 DOA: 08/19/2014  Referring physician: ED physician PCP: Cory Richards, PA-C  Specialists:   Chief Complaint: SOB and left leg swelling  HPI: Cory Dennis is a 69 y.o. male with past medical history of COPD, tobacco abuse, alcohol abuse, coronary artery disease, hypertension, anxiety, CHF, who presents with worsening shortness of breath and left leg swelling.  Patient reports that since starting Chantix 2 weeks ago he has been becoming increasingly short of breath. His left leg is more swollen. He reports he coughs and brings up clear mucus. He denies any blood in the sputum. He denies chest pain or fever. The patient reports that he visited his doctor today. He was sent immediately to the emergency department for further assessment. He reports he was a long-time smoker and just gave up until very recently. He reports he drinks about 6-8 beers a day. Patient denies fever, chills, abdominal pain, diarrhea, dysuria, urgency, frequency, hematuria, skin rashes. No unilateral weakness, numbness or tingling sensations. No vision change or hearing loss. Patient had oxygen desaturation to 88% in the emergency room, which improved to above 90% after started BiPAP.  In ED, patient was found to have elevated BNP 3689, negative troponin, hyperkalemia with potassium 5.4, hyponatremia with sodium 119, WBC 8.1, temperature normal, ABG showed pH 7.4, PCO2 32.2, PO2 65. Chest x-ray showed congestive heart failure changes. CT angiogram is negative for pulmonary embolism, but showed congestive heart failure changes (right is worse than the left side).  Review of Systems: As presented in the history of presenting illness, rest negative.  Where does patient live?  At home Can patient participate in ADLs? some  Allergy:  Allergies  Allergen Reactions  . Penicillins Other (See Comments)    Doesn't remember    Past  Medical History  Diagnosis Date  . Myocardial infarction   . Musculoskeletal chest pain   . Fatigue   . SOB (shortness of breath)   . Arthritis   . Anxiety   . Depression   . Syncope   . Ischemic cardiomyopathy   . COPD (chronic obstructive pulmonary disease)   . Hypertension   . Tobacco abuse   . Alcohol abuse   . CAD (coronary artery disease)   . Anemia   . Emphysema of lung   . CHF (congestive heart failure)     Past Surgical History  Procedure Laterality Date  . Coronary artery bypass graft  2011  . Cardiac surgery      Social History:  reports that he has been smoking Cigarettes.  He has a 42 pack-year smoking history. He has never used smokeless tobacco. He reports that he drinks about 12.0 - 14.4 oz of alcohol per week. He reports that he does not use illicit drugs.  Family History: Asked, but patient could not remember any family medical history  Prior to Admission medications   Medication Sig Start Date End Date Taking? Authorizing Provider  albuterol (PROVENTIL HFA;VENTOLIN HFA) 108 (90 BASE) MCG/ACT inhaler Inhale 2 puffs into the lungs every 6 (six) hours as needed for shortness of breath.    Yes Historical Provider, MD  aspirin 81 MG tablet Take 81 mg by mouth daily.   Yes Historical Provider, MD  budesonide-formoterol (SYMBICORT) 160-4.5 MCG/ACT inhaler Inhale 2 puffs into the lungs 2 (two) times daily.   Yes Historical Provider, MD  folic acid (FOLVITE) 1 MG tablet Take 1 tablet (1 mg total) by mouth daily.  02/25/14  Yes Peter M Sw[place]iland, MD  HYDROcodone-acetaminophen (LORTAB) 10-500 MG per tablet Take 1 tablet by mouth every 6 (six) hours as needed for pain.   Yes Historical Provider, MD  lisinopril (PRINIVIL,ZESTRIL) 20 MG tablet Take 1 tablet (20 mg total) by mouth daily. 02/25/14  Yes Peter M [place], MD  metoprolol (LOPRESSOR) 50 MG tablet Take 50 mg by mouth 2 (two) times daily.   Yes Historical Provider, MD  simvastatin (ZOCOR) 80 MG tablet Take 80 mg by mouth  at bedtime.   Yes Historical Provider, MD  spironolactone (ALDACTONE) 25 MG tablet Take 1 tablet (25 mg total) by mouth daily. Patient not taking: Reported on 08/05/2014 12/28/11   Peter M [place], MD  varenicline (CHANTIX CONTINUING MONTH PAK) 1 MG tablet Take 1 tablet (1 mg total) by mouth 2 (two) times daily. Patient not taking: Reported on 08/19/2014 08/05/14   Dorena Bodo, PA-C  varenicline (CHANTIX) 0.5 MG tablet Take 1 tablet (0.5 mg total) by mouth 2 (two) times daily. Patient not taking: Reported on 08/19/2014 08/05/14   Dorena Bodo, PA-C    Physical Exam: Filed Vitals:   08/19/14 1800 08/19/14 1900 08/19/14 1900 08/19/14 1910  BP: 152/83 165/92 153/89 165/92  Pulse: 86 97 96 93  Temp:      TempSrc:      Resp: Weight:      SpO2: 93% 94% 95% 95%   General: moderately distressed HEENT:       Eyes: PERRL, EOMI, no scleral icterus       ENT: No discharge from the ears and nose, no pharynx injection, no tonsillar enlargement.        Neck: Positive JVD, no bruit, no mass felt. Cardiac: S1/S2, RRR, No murmurs, No gallops or rubs Pulm:  Bilaterally diffused wheezing and fine crackles posteriorly Abd: Soft, nondistended, nontender, no rebound pain, no organomegaly, BS present Ext: 1+ pitting leg edema on the right and 3+ on the left. 2+DP/PT pulse bilaterally Musculoskeletal: No joint deformities, erythema, or stiffness, ROM full Skin: No rashes.  Neuro: Alert and oriented X3, cranial nerves II-XII grossly intact, muscle strength 5/5 in all extremeties, sensation to light touch intact.  Psych: Patient is not psychotic, no suicidal or hemocidal ideation.  Labs on Admission:  Basic Metabolic Panel:  Recent Labs Lab 08/19/14 1608  NA 119*  K 5.4*  CL 85*  GLUCOSE 119*  BUN 11  CREATININE 0.80   Liver Function Tests: No results for input(s): AST, ALT, ALKPHOS, BILITOT, PROT, ALBUMIN in the last 168 hours. No results for input(s): LIPASE, AMYLASE in the last 168  hours. No results for input(s): AMMONIA in the last 168 hours. CBC:  Recent Labs Lab 08/19/14 1558 08/19/14 1608  WBC 8.1  --   NEUTROABS 6.7  --   HGB 13.1 14.3  HCT 36.3* 42.0  MCV 88.1  --   PLT 183  --    Cardiac Enzymes: No results for input(s): CKTOTAL, CKMB, CKMBINDEX, TROPONINI in the last 168 hours.  BNP (last 3 results)  Recent Labs  08/19/14 1558  BNP 3689.7*    ProBNP (last 3 results) No results for input(s): PROBNP in the last 8760 hours.  CBG: No results for input(s): GLUCAP in the last 168 hours.  Radiological Exams on Admission: Dg Chest 2 View  08/19/2014   CLINICAL DATA:  Shortness of breath since starting new medication on 08/06/2014, COPD, smoker, coronary disease post MI and CABG, hypertension,  ischemic cardiomyopathy  EXAM: CHEST  2 VIEW  COMPARISON:  01/19/2012  FINDINGS: Enlargement of cardiac silhouette post CABG.  Slight vascular congestion.  Bibasilar effusions and atelectasis greater on RIGHT, new.  Minimal perihilar pulmonary edema.  No pneumothorax.  Bones demineralized.  IMPRESSION: Mild CHF with bibasilar effusions and atelectasis greater on RIGHT.   Electronically Signed   By: Ulyses Southward M.D.   On: 08/19/2014 16:53   Ct Angio Chest Pe W/cm &/or Wo Cm  08/19/2014   CLINICAL DATA:  Short of breath for 2 weeks. Lower extremity swelling.  EXAM: CT ANGIOGRAPHY CHEST WITH CONTRAST  TECHNIQUE: Multidetector CT imaging of the chest was performed using the standard protocol during bolus administration of intravenous contrast. Multiplanar CT image reconstructions and MIPs were obtained to evaluate the vascular anatomy.  CONTRAST:  80mL OMNIPAQUE IOHEXOL 350 MG/ML SOLN  COMPARISON:  Chest radiograph 08/19/2014.  Chest CT 01/31/2012.  FINDINGS: Bones: Age indeterminate but new T2 compression fracture with about 20% loss of anterior vertebral body height and no retropulsion. Sclerosis along the superior endplate suggests healing and chronicity. No  paravertebral phlegmon. 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  Cardiovascular: Technically adequate study for evaluation of pulmonary embolism. Negative for pulmonary embolism. Aortic atherosclerosis. Median sternotomy/ CABG. Global cardiomegaly. Mild respiratory motion degrades evaluation of the lung bases.  Lungs: Collapse/ consolidation of the lower lobes and RIGHT middle lobe. This is most compatible with compressive atelectasis associated with the pleural effusions. Interlobular septal thickening is present consistent with interstitial pulmonary edema. There is airspace disease at the apices bilaterally, compatible with alveolar edema.  Central airways: Patent.  Effusions: Moderate RIGHT and small LEFT pleural effusion. RIGHT pleural effusion tracks anteriorly along the RIGHT middle lobe with associated compressive atelectasis.  Small amount of pericardial fluid or thickening.  Lymphadenopathy: No axillary adenopathy. Small mediastinal lymph nodes are present which are probably congestive given the other findings.  Esophagus: Grossly normal.  Upper abdomen: Within normal limits.  Other: None.  Review of the MIP images confirms the above findings.  IMPRESSION: 1. Negative for pulmonary embolism. 2. Moderate CHF with RIGHT-greater-than- LEFT bilateral pleural effusions, interstitial and mild apical alveolar pulmonary edema. 3. Cardiomegaly, again consistent with CHF. 4. Age indeterminate but likely chronic T2 superior endplate compression fracture. This is new compared to CT 2013.   Electronically Signed   By: Andreas Newport M.D.   On: 08/19/2014 18:50    EKG: Independently reviewed. Left atrial enlargement, right axis deviation, nonspecific T-wave change, which are similar to EKG on 06/24/14.  Assessment/Plan Principal Problem:   CHF exacerbation Active Problems:   CAD (coronary artery disease)   Tobacco abuse   COPD exacerbation   Hyponatremia   S/P CABG (coronary artery  bypass graft)   Acute respiratory failure with hypoxia   Hyperkalemia   Alcohol abuse  Acute respiratory failure with hypoxia: Patient's worsening shortness of breath is likely due to combination of CHF exacerbation and COPD exacerbation. Pulmonary embolism was ruled out by negative CTA - will admit to sdu and start BiPAP prn - treat CHF and COPD exacerbation as below:  CHF exacerbation: Patient carries diagnosis of CHF. There is no 2-D echo on record. Not clear which type of CHF. He is on spironolactone, but are not Lasix at home. Patient has elevated BNP 3689, congestive heart failure changes on chest x-ray and CTA, positiv eJVD and leg edema, all of which are consistency with CHF exacerbation. -will treat with IV lasix 40  mg bid -ASA , metoprolol, lisinopril -will cycle CE X3 -will check TSH -will get 2-D echo to evaluate EF -Low salt and heart diet  COPD exacerbation: Patient has diffused wheezing bilaterally on auscultation, indicating COPD exacerbation.  -Nebulizers: scheduled Duoneb and prn albuterol -Solu-Medrol 60 mg IV daily  -Mucinex for cough  -Blood and sputum culture, respiratory viral panel -Urine legionella and S. pneumococcal antigen -Follow up blood culture x2, sputum culture, respiratory virus panel, Flu pcr  Hyperkalemia: K=5.4. No EKG change. Expect correction with starting Lasix 40 mg bid -hold spironolactone -will not hold lisinopril since his hyperkalemia is mild, and is expected to be corrected with Lasix -Repeat BMP at 12:00, and 5 AM  Hyponatremia: Sodium 119. Likely related to CHF and spironolactone use. -Hold spironolactone -Follow-up BMP  Alcohol abuse: -CIWA protocol  Tobacco abuse: -Nicotine patch  Coronary artery disease: Post status of CABG 2011. Currently no chest pain. -Continue home medications: Aspirin, metoprolol, Zocor, lisinopril   DVT ppx: SQ Heparin    Code Status: Full code Family Communication:   Yes, patient's  stepdaughter      at bed side Disposition Plan: Admit to inpatient   Date of Service 08/19/2014    Lorretta Harp [facility] Pager [phone]  If 7PM-7AM, please contact night-coverage [email or web address] Password [email or web address] 08/19/2014, 8:16 PM

## 2014-08-20 ENCOUNTER — Encounter: Payer: Self-pay | Admitting: Physician Assistant

## 2014-08-20 ENCOUNTER — Encounter (HOSPITAL_COMMUNITY): Payer: Self-pay | Admitting: *Deleted

## 2014-08-20 ENCOUNTER — Encounter (HOSPITAL_COMMUNITY): Payer: Self-pay | Admitting: Physician Assistant

## 2014-08-20 DIAGNOSIS — M7989 Other specified soft tissue disorders: Secondary | ICD-10-CM

## 2014-08-20 DIAGNOSIS — I2510 Atherosclerotic heart disease of native coronary artery without angina pectoris: Secondary | ICD-10-CM

## 2014-08-20 DIAGNOSIS — I509 Heart failure, unspecified: Secondary | ICD-10-CM

## 2014-08-20 LAB — URINALYSIS, ROUTINE W REFLEX MICROSCOPIC
Bilirubin Urine: NEGATIVE
GLUCOSE, UA: NEGATIVE mg/dL
Hgb urine dipstick: NEGATIVE
Ketones, ur: 15 mg/dL — AB
Leukocytes, UA: NEGATIVE
Nitrite: NEGATIVE
PROTEIN: NEGATIVE mg/dL
Specific Gravity, Urine: 1.017 (ref 1.005–1.030)
Urobilinogen, UA: 1 mg/dL (ref 0.0–1.0)
pH: 6 (ref 5.0–8.0)

## 2014-08-20 LAB — BASIC METABOLIC PANEL WITH GFR
Anion gap: 14 (ref 5–15)
BUN: 16 mg/dL (ref 6–23)
CO2: 24 mmol/L (ref 19–32)
Calcium: 8.7 mg/dL (ref 8.4–10.5)
Chloride: 90 mmol/L — ABNORMAL LOW (ref 96–112)
Creatinine, Ser: 1.21 mg/dL (ref 0.50–1.35)
GFR calc Af Amer: 69 mL/min — ABNORMAL LOW
GFR calc non Af Amer: 59 mL/min — ABNORMAL LOW
Glucose, Bld: 192 mg/dL — ABNORMAL HIGH (ref 70–99)
Potassium: 3.9 mmol/L (ref 3.5–5.1)
Sodium: 128 mmol/L — ABNORMAL LOW (ref 135–145)

## 2014-08-20 LAB — BASIC METABOLIC PANEL
Anion gap: 10 (ref 5–15)
BUN: 13 mg/dL (ref 6–23)
CO2: 22 mmol/L (ref 19–32)
Calcium: 7.9 mg/dL — ABNORMAL LOW (ref 8.4–10.5)
Chloride: 88 mmol/L — ABNORMAL LOW (ref 96–112)
Creatinine, Ser: 0.89 mg/dL (ref 0.50–1.35)
GFR calc Af Amer: >90 mL/min (ref >90)
GFR, EST NON AFRICAN AMERICAN: 85 mL/min — AB (ref >90)
Glucose, Bld: 119 mg/dL — ABNORMAL HIGH (ref 70–99)
Potassium: 5.3 mmol/L — ABNORMAL HIGH (ref 3.5–5.1)
Sodium: 120 mmol/L — ABNORMAL LOW (ref 135–145)

## 2014-08-20 LAB — TROPONIN I
TROPONIN I: 0.05 ng/mL — AB (ref <0.031)
Troponin I: 0.06 ng/mL — ABNORMAL HIGH (ref <0.031)

## 2014-08-20 LAB — CREATININE, URINE, RANDOM: Creatinine, Urine: 38.58 mg/dL

## 2014-08-20 LAB — HIV ANTIBODY (ROUTINE TESTING W REFLEX): HIV Screen 4th Generation wRfx: NONREACTIVE

## 2014-08-20 LAB — INFLUENZA PANEL BY PCR (TYPE A & B)
H1N1 flu by pcr: NOT DETECTED
INFLAPCR: NEGATIVE
Influenza B By PCR: NEGATIVE

## 2014-08-20 LAB — TSH: TSH: 1.147 u[IU]/mL (ref 0.350–4.500)

## 2014-08-20 LAB — OSMOLALITY, URINE: Osmolality, Ur: 276 mosm/kg — ABNORMAL LOW (ref 390–1090)

## 2014-08-20 LAB — SODIUM, URINE, RANDOM: SODIUM UR: 42 mmol/L

## 2014-08-20 LAB — OSMOLALITY: Osmolality: 259 mosm/kg — ABNORMAL LOW (ref 275–300)

## 2014-08-20 MED ORDER — PERFLUTREN LIPID MICROSPHERE
INTRAVENOUS | Status: AC
  Administered 2014-08-20: 2 mL
  Filled 2014-08-20: qty 10

## 2014-08-20 MED ORDER — CETYLPYRIDINIUM CHLORIDE 0.05 % MT LIQD: 7.0000 mL | Freq: Two times a day (BID) | OROMUCOSAL | Status: DC

## 2014-08-20 MED ORDER — SODIUM POLYSTYRENE SULFONATE 15 GM/60ML PO SUSP
30.0000 g | Freq: Once | ORAL | Status: AC
  Administered 2014-08-20: 30 g via ORAL
  Filled 2014-08-20: qty 120

## 2014-08-20 MED ORDER — GUAIFENESIN-DM 100-10 MG/5ML PO SYRP: 10.0000 mL | ORAL_SOLUTION | ORAL | Status: DC | PRN

## 2014-08-20 MED ORDER — PERFLUTREN LIPID MICROSPHERE
1.0000 mL | INTRAVENOUS | Status: AC | PRN
  Filled 2014-08-20: qty 10

## 2014-08-20 MED ORDER — CHLORHEXIDINE GLUCONATE 0.12 % MT SOLN
15.0000 mL | Freq: Two times a day (BID) | OROMUCOSAL | Status: DC
  Administered 2014-08-20 – 2014-08-23 (×6): 15 mL via OROMUCOSAL
  Filled 2014-08-20 (×8): qty 15

## 2014-08-20 MED ORDER — LISINOPRIL 5 MG PO TABS
5.0000 mg | ORAL_TABLET | Freq: Every day | ORAL | Status: DC
  Administered 2014-08-21 – 2014-08-23 (×3): 5 mg via ORAL
  Filled 2014-08-20 (×4): qty 1

## 2014-08-20 MED ORDER — GUAIFENESIN-DM 100-10 MG/5ML PO SYRP
10.0000 mL | ORAL_SOLUTION | ORAL | Status: DC | PRN
  Administered 2014-08-20: 10 mL via ORAL
  Filled 2014-08-20: qty 10

## 2014-08-20 MED ORDER — METHYLPREDNISOLONE SODIUM SUCC 125 MG IJ SOLR
80.0000 mg | Freq: Four times a day (QID) | INTRAMUSCULAR | Status: DC
  Administered 2014-08-20 – 2014-08-22 (×9): 80 mg via INTRAVENOUS
  Filled 2014-08-20: qty 2
  Filled 2014-08-20 (×2): qty 1.28
  Filled 2014-08-20 (×3): qty 2
  Filled 2014-08-20 (×6): qty 1.28

## 2014-08-20 MED ORDER — AZITHROMYCIN 500 MG PO TABS
500.0000 mg | ORAL_TABLET | Freq: Every day | ORAL | Status: DC
  Administered 2014-08-20 – 2014-08-23 (×4): 500 mg via ORAL
  Filled 2014-08-20 (×4): qty 1

## 2014-08-20 NOTE — Consult Note (Signed)
CARDIOLOGY CONSULT NOTE   Patient ID: Cory Dennis MRN: [id number], DOB/AGE: 70/08/1945   Admit date: 08/19/2014 Date of Consult: 08/20/2014   Primary Physician: Frazier Richards, PA-C Primary Cardiologist: Dr. Swaziland  Pt. Profile  69 year old Cory Dennis with past medical history of CAD status post four-vessel CABG, status post LV aneurysm repair with previous baseline EF 30%, chronic systolic heart failure secondary to ischemic cardiomyopathy present with acute on chronic systolic HF  Problem List  Past Medical History  Diagnosis Date  . Myocardial infarction   . Musculoskeletal chest pain   . Fatigue   . SOB (shortness of breath)   . Arthritis   . Anxiety   . Depression   . Syncope   . Ischemic cardiomyopathy   . COPD (chronic obstructive pulmonary disease)   . Hypertension   . Tobacco abuse   . Alcohol abuse   . CAD (coronary artery disease)   . Anemia   . Emphysema of lung   . CHF (congestive heart failure)     Past Surgical History  Procedure Laterality Date  . Coronary artery bypass graft  2011    4v CABG [facility] in [place] in 06/2009 with LIMA to LAD, RIMA to distal RCA, sequential SVG to OM1/OM 2  . Left ventricular aneurysm repair       Allergies  Allergies  Allergen Reactions  . Penicillins Other (See Comments)    Doesn't remember    HPI   The patient is a 69 year old Cory Dennis with past medical history of CAD status post four-vessel CABG, status post LV aneurysm repair with previous baseline EF 30%, chronic systolic heart failure secondary to ischemic cardiomyopathy. According to medical record, he underwent four-vessel CABG at [facility] in [place] in 06/2009 with LIMA to LAD, RIMA to distal RCA, sequential SVG to OM1/OM 2. Post procedure, he had nonunion of left parasternal region and was seen by Dr. Donata Clay who did not recommend surgery for his sternum. According to cardiology note in 2013, Dr. Swaziland had a discussion with  the patient regarding ICD placement given that his persistent EF less than 35%, however he was not interested as a time. He has been on aspirin, lisinopril, metoprolol, and spironolactone since. He is not on Lasix at home. According to the patient, he has been compliant with medication. He denies any recent CP. He sleeps on 2 pillows and has not noticed significant PND.   Of note, during his last office visit in January 2016, he had viral URI symptom with clear productive cough. Since that time, according to the patient, his symptom has never completely went away. Initially improve after he took some Aleve. However it came back. He was started on Chantix after his visit with his PCP on 08/05/2014. Since that time his shortness of breath has increased. He will return to his PCPs office on 08/19/2014 for follow-up, at which time he was complaining of shortness of breath and left lower extremity swelling. He was referred to [facility] for further evaluation. On arrival significant laboratory finding include sodium 119, creatinine 0.8, BNP 3689, troponin 0.01, normal CBC. Chest x-ray shows mild CHF with by basilar effusion and atelectasis greater on the right. Given his left lower extremity swelling, CTA of the chest was obtained which did not show PE, however confirms CHF greater on the right. Patient was admitted by internal medicine group and being treated for acute dyspnea secondary to COPD exacerbation and CHF. Viral panel was  negative so far. Nephrology has been consulted for hyponatremia. Cardiology has been consulted for acute on chronic systolic heart failure.  Inpatient Medications  . antiseptic oral rinse  7 mL Mouth Rinse q12n4p  . aspirin  81 mg Oral Daily  . atorvastatin  40 mg Oral q1800  . azithromycin  500 mg Oral Daily  . chlorhexidine  15 mL Mouth Rinse BID  . dextromethorphan-guaiFENesin  1 tablet Oral BID  . folic acid  1 mg Oral Daily  . furosemide  40 mg Intravenous BID  .  heparin  5,000 Units Subcutaneous 3 times per day  . ipratropium-albuterol  3 mL Nebulization Q4H  . lisinopril  5 mg Oral Daily  . LORazepam  0-4 mg Intravenous Q6H   Followed by  . [START ON 08/21/2014] LORazepam  0-4 mg Intravenous Q12H  . methylPREDNISolone (SOLU-MEDROL) injection  80 mg Intravenous Q6H  . metoprolol  50 mg Oral BID  . multivitamin with minerals  1 tablet Oral Daily  . nicotine  21 mg Transdermal Daily  . sodium chloride  3 mL Intravenous Q12H  . thiamine  100 mg Oral Daily   Or  . thiamine  100 mg Intravenous Daily    Family History Family History  Problem Relation Age of Onset  . Family history unknown: Yes   Per patient, he does not know if his FHx include CAD, HTN, DM, HLD, unable past medical history of his patients.  Social History History   Social History  . Marital Status: Single    Spouse Name: N/A  . Number of Children: N/A  . Years of Education: N/A   Occupational History  . Mechanic    Social History Main Topics  . Smoking status: Current Every Day Smoker -- 1.00 packs/day for 42 years    Types: Cigarettes  . Smokeless tobacco: Never Used     Comment: working on it  . Alcohol Use: 12.0 - 14.4 oz/week    20-24 Cans of beer per week     Comment: 5-6 beers per day  . Drug Use: No  . Sexual Activity: Not Currently   Other Topics Concern  . Not on file   Social History Narrative     Review of Systems  General:  No chills, fever, night sweats or weight changes.  Cardiovascular:  No chest pain, orthopnea, palpitations, paroxysmal nocturnal dyspnea. +dyspnea on exertion, edema Dermatological: No rash, lesions/masses Respiratory: +productive cough, dyspnea Urologic: No hematuria, dysuria Abdominal:   No nausea, vomiting, diarrhea, bright red blood per rectum, melena, or hematemesis Neurologic:  No visual changes, wkns, changes in mental status. All other systems reviewed and are otherwise negative except as noted above.  Physical  Exam  Blood pressure 112/48, pulse 90, temperature 98.2 F (36.8 C), temperature source Oral, resp. rate 24, height 5\' 4"  (1.626 m), weight 150 lb 2.1 oz (68.1 kg), SpO2 94 %.  General: Pleasant, NAD Psych: Normal affect. Neuro: Alert and oriented X 3. Moves all extremities spontaneously. HEENT: Normal  Neck: Supple without bruits  +JVD. Lungs:  Resp regular and unlabored. Decreased breath sound in bibasilar region Heart: RRR no s3, s4, or murmurs. Abdomen: Soft, non-tender, non-distended, BS + x 4.  Extremities: No clubbing, cyanosis or edema. Radials 1+ and equal bilaterally. LLE 1+ pitting edema. Live Tick on his back   Labs   Recent Labs  08/19/14 2220 08/20/14 0405 08/20/14 0851  TROPONINI 0.05* 0.05* 0.06*   Lab Results  Component Value Date  WBC 8.1 08/19/2014   HGB 14.3 08/19/2014   HCT 42.0 08/19/2014   MCV 88.1 08/19/2014   PLT 183 08/19/2014     Recent Labs Lab 08/20/14 0405  NA 120*  K 5.3*  CL 88*  CO2 22  BUN 13  CREATININE 0.89  CALCIUM 7.9*  GLUCOSE 119*   Lab Results  Component Value Date   CHOL 169 06/24/2014   HDL 70 06/24/2014   LDLCALC 89 06/24/2014   TRIG 48 06/24/2014   No results found for: DDIMER  Radiology/Studies  Dg Chest 2 View  08/19/2014   CLINICAL DATA:  Shortness of breath since starting new medication on 08/06/2014, COPD, smoker, coronary disease post MI and CABG, hypertension, ischemic cardiomyopathy  EXAM: CHEST  2 VIEW  COMPARISON:  01/19/2012  FINDINGS: Enlargement of cardiac silhouette post CABG.  Slight vascular congestion.  Bibasilar effusions and atelectasis greater on RIGHT, new.  Minimal perihilar pulmonary edema.  No pneumothorax.  Bones demineralized.  IMPRESSION: Mild CHF with bibasilar effusions and atelectasis greater on RIGHT.   Electronically Signed   By: Ulyses Southward M.D.   On: 08/19/2014 16:53   Ct Angio Chest Pe W/cm &/or Wo Cm  08/19/2014   CLINICAL DATA:  Short of breath for 2 weeks. Lower extremity  swelling.  EXAM: CT ANGIOGRAPHY CHEST WITH CONTRAST  TECHNIQUE: Multidetector CT imaging of the chest was performed using the standard protocol during bolus administration of intravenous contrast. Multiplanar CT image reconstructions and MIPs were obtained to evaluate the vascular anatomy.  CONTRAST:  80mL OMNIPAQUE IOHEXOL 350 MG/ML SOLN  COMPARISON:  Chest radiograph 08/19/2014.  Chest CT 01/31/2012.  FINDINGS: Bones: Age indeterminate but new T2 compression fracture with about 20% loss of anterior vertebral body height and no retropulsion. Sclerosis along the superior endplate suggests healing and chronicity. No paravertebral phlegmon. 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  Cardiovascular: Technically adequate study for evaluation of pulmonary embolism. Negative for pulmonary embolism. Aortic atherosclerosis. Median sternotomy/ CABG. Global cardiomegaly. Mild respiratory motion degrades evaluation of the lung bases.  Lungs: Collapse/ consolidation of the lower lobes and RIGHT middle lobe. This is most compatible with compressive atelectasis associated with the pleural effusions. Interlobular septal thickening is present consistent with interstitial pulmonary edema. There is airspace disease at the apices bilaterally, compatible with alveolar edema.  Central airways: Patent.  Effusions: Moderate RIGHT and small LEFT pleural effusion. RIGHT pleural effusion tracks anteriorly along the RIGHT middle lobe with associated compressive atelectasis.  Small amount of pericardial fluid or thickening.  Lymphadenopathy: No axillary adenopathy. Small mediastinal lymph nodes are present which are probably congestive given the other findings.  Esophagus: Grossly normal.  Upper abdomen: Within normal limits.  Other: None.  Review of the MIP images confirms the above findings.  IMPRESSION: 1. Negative for pulmonary embolism. 2. Moderate CHF with RIGHT-greater-than- LEFT bilateral pleural effusions,  interstitial and mild apical alveolar pulmonary edema. 3. Cardiomegaly, again consistent with CHF. 4. Age indeterminate but likely chronic T2 superior endplate compression fracture. This is new compared to CT 2013.   Electronically Signed   By: Andreas Newport M.D.   On: 08/19/2014 18:50    ECG  NSR with PVC, incomplete LBBB, no significant ischemic changes  ASSESSMENT AND PLAN  1. Acute dyspnea related to #2 and #3  2. Acute on chronic systolic HF   - previous baseline EF 30%, echo 08/20/2014 25%, no LV thrombus under contrast, akinesis in multiple area, grade 2 diastolic dysfunction, mild MR,  PA peak pressure  - continue BB, ACEI and spironolactone.   - continue IV lasix. Will need to be on PO lasix for discharge. May benefit from change lopressor to Toprol XL for HF after fully diuresed.  - at some point, may benefit from reassess ICD candidacy  3. Acute COPD exacerbation: per primary team  4. Elevated trop: likely related to #1  - denies any CP  5. Hyponatremia: per nephrology, free water restriction, maybe related to hypervolemia  6. CAD s/p CABG 06/2009 (LIMA to LAD, RIMA to distal RCA, seq SVG to OM1/Om2)  - stable, no angina  7. ICM 8. HTN 9. Tobacco and ETOH abuse: need ETOH cessation, drinks 5-6 beers a day   Ramond Dial, PA-C 08/20/2014, 2:43 PM

## 2014-08-20 NOTE — Progress Notes (Signed)
VASCULAR LAB PRELIMINARY  PRELIMINARY  PRELIMINARY  PRELIMINARY  BLEV completed.    Preliminary report:  Negative DVT bilaterally.  Incidental finding of significant arterial plaque bilateral lower extremities.                                 Right popliteal fossa with Baker's Cyst and adjacent fluid collection distal femur.  Loralie Champagne, RVT 08/20/2014, 2:18 PM

## 2014-08-20 NOTE — Progress Notes (Signed)
Patient ID: Cory Dennis MRN: [id number], DOB: 1945/09/30, 69 y.o. Date of Encounter: 08/20/2014, 11:44 AM    Chief Complaint:  Chief Complaint  Patient presents with  . c/o left leg swelling x 2 weeks     HPI: 69 y.o. year old male resents with above symptoms. Is that this started happening pretty soon right after his last office visit with me. Says he has had this swelling since then. I Note that he also seems short of breath can hear dyspnea with just him talking to me. When this started and he said at the same time as the left leg swelling producing right after his last visit here.     Home Meds:   No facility-administered medications prior to visit.   Outpatient Prescriptions Prior to Visit  Medication Sig Dispense Refill  . albuterol (PROVENTIL HFA;VENTOLIN HFA) 108 (90 BASE) MCG/ACT inhaler Inhale 2 puffs into the lungs every 6 (six) hours as needed for shortness of breath.     Marland Kitchen aspirin 81 MG tablet Take 81 mg by mouth daily.    . folic acid (FOLVITE) 1 MG tablet Take 1 tablet (1 mg total) by mouth daily. 30 tablet 6  . HYDROcodone-acetaminophen (LORTAB) 10-500 MG per tablet Take 1 tablet by mouth every 6 (six) hours as needed for pain.    Marland Kitchen lisinopril (PRINIVIL,ZESTRIL) 20 MG tablet Take 1 tablet (20 mg total) by mouth daily. 30 tablet 6  . metoprolol (LOPRESSOR) 50 MG tablet Take 50 mg by mouth 2 (two) times daily.    . simvastatin (ZOCOR) 80 MG tablet Take 80 mg by mouth at bedtime.    Marland Kitchen spironolactone (ALDACTONE) 25 MG tablet Take 1 tablet (25 mg total) by mouth daily. (Patient not taking: Reported on 08/05/2014)    . varenicline (CHANTIX CONTINUING MONTH PAK) 1 MG tablet Take 1 tablet (1 mg total) by mouth 2 (two) times daily. (Patient not taking: Reported on 08/19/2014) 60 tablet 2  . varenicline (CHANTIX) 0.5 MG tablet Take 1 tablet (0.5 mg total) by mouth 2 (two) times daily. (Patient not taking: Reported on 08/19/2014) 60 tablet 0    Allergies:  Allergies    Allergen Reactions  . Penicillins Other (See Comments)    Doesn't remember      Review of Systems: See HPI for pertinent ROS. All other ROS negative.    Physical Exam: Blood pressure 116/64, pulse 76, temperature 97.2 F (36.2 C), temperature source Oral, resp. rate 22, weight 156 lb (70.761 kg), SpO2 91 %., Body mass index is 26.76 kg/(m^2). General:  WM. Dyspneic. Can hear him breathing hard across the room. But appears in no acute distress. Neck: Supple. No thyromegaly. No lymphadenopathy. Lungs: Harsh wheezes and rhonchi throughout bilaterally.  Heart: Regular rhythm. No murmurs, rubs, or gallops. Abdomen: Soft, non-tender, non-distended with normoactive bowel sounds. No hepatomegaly. No rebound/guarding. No obvious abdominal masses. Msk:  Strength and tone normal for age. Extremities/Skin: Left lower leg much larger than right lower leg. 3 + edema on left leg and foot.  Neuro: Alert and oriented X 3. Moves all extremities spontaneously. Gait is normal. CNII-XII grossly in tact. Psych:  Responds to questions appropriately with a normal affect.     ASSESSMENT AND PLAN:  69 y.o. year old male with   1. Edema of left lower extremity  2. Shortness of breath  3. S/P CABG (coronary artery bypass graft)  4. Tobacco abuse  5. 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 I discussed with both of them that he needs to go immediately to the emergency room. Discussed calling EMS to transport him there but family member states that they can transport him directly to the emergency room immediately. He left the office in stable condition and was to go to immediately directly to [facility] emergency room.   Signed, [address] [place], [place], [facility] 08/20/2014 11:44 AM

## 2014-08-20 NOTE — Progress Notes (Signed)
  Echocardiogram 2D Echocardiogram with Definity has been performed.  Ahmad Vanwey 08/20/2014, 10:45 AM

## 2014-08-20 NOTE — Evaluation (Signed)
Physical Therapy Evaluation Patient Details Name: Cory Dennis MRN: [id number] DOB: 1946-02-27 Today's Date: 08/20/2014   History of Present Illness  Pt amd with acute Hypoxia respiratory failure due to COPD exacerbation and possibly mild acute on chronic CHF. PMH - ETOH, CABG, MI, COPD  Clinical Impression  Pt admitted with above diagnosis. Pt currently with functional limitations due to the deficits listed below (see PT Problem List).  Pt will benefit from skilled PT to increase their independence and safety with mobility to allow discharge to the venue listed below.       Follow Up Recommendations Home health PT;Supervision - Intermittent    Equipment Recommendations  Other (comment) (to be assessed)    Recommendations for Other Services       Precautions / Restrictions Precautions Precautions: Fall      Mobility  Bed Mobility Overal bed mobility: Needs Assistance Bed Mobility: Supine to Sit     Supine to sit: Supervision;HOB elevated     General bed mobility comments: supervision for lines and incr time.  Transfers Overall transfer level: Needs assistance Equipment used: None Transfers: Sit to/from Stand Sit to Stand: Min guard         General transfer comment: Assist for balance and lines  Ambulation/Gait Ambulation/Gait assistance: Min assist Ambulation Distance (Feet): 125 Feet Assistive device: None Gait Pattern/deviations: Step-through pattern;Decreased stride length;Drifts right/left   Gait velocity interpretation: Below normal speed for age/gender General Gait Details: Assist for balance.   Stairs            Wheelchair Mobility    Modified Rankin (Stroke Patients Only)       Balance Overall balance assessment: Needs assistance Sitting-balance support: No upper extremity supported;Feet supported Sitting balance-Leahy Scale: Good       Standing balance-Leahy Scale: Fair                               Pertinent  Vitals/Pain Pain Assessment: No/denies pain    Home Living Family/patient expects to be discharged to:: Private residence Living Arrangements: Alone   Type of Home: House Home Access: Stairs to enter Entrance Stairs-Rails: None Entrance Stairs-Number of Steps: 4 Home Layout: One level Home Equipment: None      Prior Function Level of Independence: Independent               Hand Dominance        Extremity/Trunk Assessment   Upper Extremity Assessment: Defer to OT evaluation           Lower Extremity Assessment: Generalized weakness         Communication   Communication: No difficulties  Cognition Arousal/Alertness: Awake/alert Behavior During Therapy: WFL for tasks assessed/performed Overall Cognitive Status: Within Functional Limits for tasks assessed                      General Comments      Exercises        Assessment/Plan    PT Assessment Patient needs continued PT services  PT Diagnosis Difficulty walking;Generalized weakness   PT Problem List Decreased strength;Decreased activity tolerance;Decreased balance;Decreased mobility;Decreased knowledge of use of DME  PT Treatment Interventions DME instruction;Balance training;Gait training;Stair training;Functional mobility training;Patient/family education;Therapeutic activities;Therapeutic exercise   PT Goals (Current goals can be found in the Care Plan section) Acute Rehab PT Goals Patient Stated Goal: return home PT Goal Formulation: With patient Time For Goal Achievement: 08/27/14 Potential to  Achieve Goals: Good    Frequency Min 3X/week   Barriers to discharge Decreased caregiver support      Co-evaluation               End of Session Equipment Utilized During Treatment: Gait belt;Oxygen Activity Tolerance: Patient limited by fatigue Patient left: in chair;with call bell/phone within reach;with chair alarm set           Time: 1152-1207 PT Time Calculation (min)  (ACUTE ONLY): 15 min   Charges:   PT Evaluation $Initial PT Evaluation Tier I: 1 Procedure     PT G Codes:        Nyema Hachey 2014/09/08, 1:22 PM  Dariel Betzer PT [phone]

## 2014-08-20 NOTE — Consult Note (Signed)
Referring Provider: No ref. provider found Primary Care Physician:  Frazier Richards, PA-C Primary Nephrologist:  none  Reason for Consultation:   hyponatremia HPI: this is a very pleasant man with hyponatremia It appears that he has COPD and unilateral edema on his left leg. 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 He lives alone. He smokes 1 -2 PPD and drinks beer. He appears generally malnourished  He denies any swelling or distention of his abdomen. He denies any sores. He appears unkempt  Past Medical History  Diagnosis Date  . Myocardial infarction   . Musculoskeletal chest pain   . Fatigue   . SOB (shortness of breath)   . Arthritis   . Anxiety   . Depression   . Syncope   . Ischemic cardiomyopathy   . COPD (chronic obstructive pulmonary disease)   . Hypertension   . Tobacco abuse   . Alcohol abuse   . CAD (coronary artery disease)   . Anemia   . Emphysema of lung   . CHF (congestive heart failure)     Past Surgical History  Procedure Laterality Date  . Coronary artery bypass graft  2011  . Cardiac surgery      Prior to Admission medications   Medication Sig Start Date End Date Taking? Authorizing Provider  albuterol (PROVENTIL HFA;VENTOLIN HFA) 108 (90 BASE) MCG/ACT inhaler Inhale 2 puffs into the lungs every 6 (six) hours as needed for shortness of breath.    Yes Historical Provider, MD  aspirin 81 MG tablet Take 81 mg by mouth daily.   Yes Historical Provider, MD  budesonide-formoterol (SYMBICORT) 160-4.5 MCG/ACT inhaler Inhale 2 puffs into the lungs 2 (two) times daily.   Yes Historical Provider, MD  folic acid (FOLVITE) 1 MG tablet Take 1 tablet (1 mg total) by mouth daily. 02/25/14  Yes Peter M [place], MD  HYDROcodone-acetaminophen (LORTAB) 10-500 MG per tablet Take 1 tablet by mouth every 6 (six) hours as needed for pain.   Yes Historical Provider, MD  lisinopril (PRINIVIL,ZESTRIL) 20 MG tablet Take 1 tablet (20 mg total) by mouth daily.  02/25/14  Yes Peter M [place], MD  metoprolol (LOPRESSOR) 50 MG tablet Take 50 mg by mouth 2 (two) times daily.   Yes Historical Provider, MD  simvastatin (ZOCOR) 80 MG tablet Take 80 mg by mouth at bedtime.   Yes Historical Provider, MD  spironolactone (ALDACTONE) 25 MG tablet Take 1 tablet (25 mg total) by mouth daily. Patient not taking: Reported on 08/05/2014 12/28/11   Peter M [place], MD  varenicline (CHANTIX CONTINUING MONTH PAK) 1 MG tablet Take 1 tablet (1 mg total) by mouth 2 (two) times daily. Patient not taking: Reported on 08/19/2014 08/05/14   Dorena Bodo, PA-C  varenicline (CHANTIX) 0.5 MG tablet Take 1 tablet (0.5 mg total) by mouth 2 (two) times daily. Patient not taking: Reported on 08/19/2014 08/05/14   Dorena Bodo, PA-C    Current Facility-Administered Medications  Medication Dose Route Frequency Provider Last Rate Last Dose  . 0.9 %  sodium chloride infusion  250 mL Intravenous PRN Lorretta Harp, MD      . acetaminophen (TYLENOL) tablet 650 mg  650 mg Oral Q4H PRN Lorretta Harp, MD      . albuterol (PROVENTIL) (2.5 MG/3ML) 0.083% nebulizer solution 2.5 mg  2.5 mg Nebulization Q2H PRN Lorretta Harp, MD      . antiseptic oral rinse (CPC / CETYLPYRIDINIUM CHLORIDE 0.05%) solution 7 mL  7 mL Mouth  Rinse q12n4p Lorretta Harp, MD      . aspirin chewable tablet 81 mg  81 mg Oral Daily Lorretta Harp, MD   81 mg at 08/20/14 0916  . atorvastatin (LIPITOR) tablet 40 mg  40 mg Oral q1800 Lorretta Harp, MD   40 mg at 08/19/14 2243  . azithromycin (ZITHROMAX) tablet 500 mg  500 mg Oral Daily Leroy Sea, MD      . chlorhexidine (PERIDEX) 0.12 % solution 15 mL  15 mL Mouth Rinse BID Lorretta Harp, MD   15 mL at 08/20/14 0740  . dextromethorphan-guaiFENesin (MUCINEX DM) 30-600 MG per 12 hr tablet 1 tablet  1 tablet Oral BID Lorretta Harp, MD   1 tablet at 08/20/14 [phone]  . folic acid (FOLVITE) tablet 1 mg  1 mg Oral Daily Lorretta Harp, MD   1 mg at 08/20/14 0916  . furosemide (LASIX) injection 40 mg  40 mg Intravenous BID Lorretta Harp, MD   40 mg at 08/20/14 0734  . heparin injection 5,000 Units  5,000 Units Subcutaneous 3 times per day Lorretta Harp, MD   5,000 Units at 08/20/14 0546  . HYDROcodone-acetaminophen (NORCO/VICODIN) 5-325 MG per tablet 1 tablet  1 tablet Oral Q4H PRN Lorretta Harp, MD      . ipratropium-albuterol (DUONEB) 0.5-2.5 (3) MG/3ML nebulizer solution 3 mL  3 mL Nebulization Q4H Lorretta Harp, MD   3 mL at 08/20/14 0836  . lisinopril (PRINIVIL,ZESTRIL) tablet 5 mg  5 mg Oral Daily Leroy Sea, MD   5 mg at 08/20/14 1000  . LORazepam (ATIVAN) injection 0-4 mg  0-4 mg Intravenous Q6H Lorretta Harp, MD   1 mg at 08/19/14 2244   Followed by  . [START ON 08/21/2014] LORazepam (ATIVAN) injection 0-4 mg  0-4 mg Intravenous Q12H Lorretta Harp, MD      . LORazepam (ATIVAN) tablet 1 mg  1 mg Oral Q6H PRN Lorretta Harp, MD       Or  . LORazepam (ATIVAN) injection 1 mg  1 mg Intravenous Q6H PRN Lorretta Harp, MD      . methylPREDNISolone sodium succinate (SOLU-MEDROL) 125 mg/2 mL injection 80 mg  80 mg Intravenous Q6H Leroy Sea, MD   80 mg at 08/20/14 0917  . metoprolol (LOPRESSOR) tablet 50 mg  50 mg Oral BID Lorretta Harp, MD   50 mg at 08/20/14 5366  . multivitamin with minerals tablet 1 tablet  1 tablet Oral Daily Lorretta Harp, MD   1 tablet at 08/20/14 0916  . nicotine (NICODERM CQ - dosed in mg/24 hours) patch 21 mg  21 mg Transdermal Daily Lorretta Harp, MD   21 mg at 08/19/14 2258  . ondansetron (ZOFRAN) injection 4 mg  4 mg Intravenous Q6H PRN Lorretta Harp, MD      . perflutren lipid microspheres (DEFINITY) IV suspension  1-10 mL Intravenous PRN Leroy Sea, MD      . sodium chloride 0.9 % injection 3 mL  3 mL Intravenous Q12H Lorretta Harp, MD   3 mL at 08/20/14 0917  . sodium chloride 0.9 % injection 3 mL  3 mL Intravenous PRN Lorretta Harp, MD      . thiamine (VITAMIN B-1) tablet 100 mg  100 mg Oral Daily Lorretta Harp, MD   100 mg at 08/20/14 4403   Or  . thiamine (B-1) injection 100 mg  100 mg Intravenous Daily Lorretta Harp, MD         Allergies as of 08/19/2014 -  Review Complete 08/19/2014  Allergen Reaction Noted  . Penicillins Other (See Comments) 10/27/2011    History reviewed. No pertinent family history.  History   Social History  . Marital Status: Single    Spouse Name: N/A  . Number of Children: N/A  . Years of Education: N/A   Occupational History  . Mechanic    Social History Main Topics  . Smoking status: Current Every Day Smoker -- 1.00 packs/day for 42 years    Types: Cigarettes  . Smokeless tobacco: Never Used     Comment: working on it  . Alcohol Use: 12.0 - 14.4 oz/week    20-24 Cans of beer per week     Comment: 5-6 beers per day  . Drug Use: No  . Sexual Activity: Not Currently   Other Topics Concern  . Not on file   Social History Narrative    Review of Systems: Gen: Denies any fever, chills, sweats, anorexia, +  fatigue, weakness, malaise HEENT: No visual complaints, No history of Retinopathy. Normal external appearance No Epistaxis or Sore throat. No sinusitis.   CV: Denies chest pain, angina, palpitations, syncope, orthopnea, PND, peripheral edema, and claudication. Resp:  Wheeze and and diminished air entry GI: Denies vomiting blood, jaundice, and fecal incontinence.   Denies dysphagia or odynophagia. GU : Denies urinary burning, blood in urine, urinary frequency, urinary hesitancy, nocturnal urination, and urinary incontinence.  No renal calculi. MS: Denies joint pain, limitation of movement, and swelling, stiffness, low back pain, extremity pain. Denies muscle weakness, cramps, atrophy.  No use of non steroidal antiinflammatory drugs. Derm: Denies rash, itching, dry skin, hives, moles, warts, or unhealing ulcers.  Psych: Denies depression, anxiety, memory loss, suicidal ideation, hallucinations, paranoia, and confusion. Heme: Denies bruising, bleeding, and enlarged lymph nodes. Neuro: No headache.  No diplopia. No dysarthria.  No dysphasia.  No history of CVA.  No  Seizures. No paresthesias.  No weakness. Endocrine No DM.  No Thyroid disease.  No Adrenal disease.  Physical Exam: Vital signs in last 24 hours: Temp:  [97.2 F (36.2 C)-99 F (37.2 C)] 97.4 F (36.3 C) (03/24 0800) Pulse Rate:  [64-97] 82 (03/24 0836) Resp:  [10-35] 23 (03/24 0836) BP: (98-165)/(49-92) 130/60 mmHg (03/24 0836) SpO2:  [91 %-100 %] 94 % (03/24 0836) FiO2 (%):  [40 %] 40 % (03/24 0404) Weight:  [68.1 kg (150 lb 2.1 oz)-71.487 kg (157 lb 9.6 oz)] 68.1 kg (150 lb 2.1 oz) (03/24 0107) Last BM Date: 08/18/14 General:  Ill cachectic appearing Head:  Normocephalic and atraumatic. Eyes:  Blind left eye Ears:  Normal auditory acuity. Nose:  No deformity, discharge,  or lesions. Mouth:  Poor hygeine Neck:  Supple; no masses or thyromegaly. JVP not elevated Lungs:  Diminished air entry Heart:  Regular rate and rhythm; no murmurs, clicks, rubs,  or gallops. Abdomen:  Soft, nontender and nondistended. No masses, hepatosplenomegaly or hernias noted. Normal bowel sounds, without guarding, and without rebound.   Msk:  Symmetrical without gross deformities. Normal posture. Pulses:  No carotid, renal, femoral bruits. DP and PT symmetrical and equal Extremities:  Without clubbing   Nicotine stained Neurologic:  Alert and  oriented x4;  grossly normal neurologically. Skin:  Intact without significant lesions or rashes. Cervical Nodes:  No significant cervical adenopathy. Psych:  Alert and cooperative. Normal mood and affect.  Intake/Output from previous day: 03/23 0701 - 03/24 0700 In: 43 [P.O.:43] Out: 750 [Urine:750] Intake/Output this shift: Total I/O In: 240 [P.O.:240] Out: 1200 [  Urine:1200]  Lab Results:  Recent Labs  08/19/14 1558 08/19/14 1608  WBC 8.1  --   HGB 13.1 14.3  HCT 36.3* 42.0  PLT 183  --    BMET  Recent Labs  08/19/14 1608 08/19/14 2220 08/20/14 0405  NA 119* 120* 120*  K 5.4* 4.9 5.3*  CL 85* 85* 88*  CO2  --  24 22  GLUCOSE 119* 114*  119*  BUN 11 11 13   CREATININE 0.80 1.01 0.89  CALCIUM  --  8.5 7.9*   LFT No results for input(s): PROT, ALBUMIN, AST, ALT, ALKPHOS, BILITOT, BILIDIR, IBILI in the last 72 hours. PT/INR No results for input(s): LABPROT, INR in the last 72 hours. Hepatitis Panel No results for input(s): HEPBSAG, HCVAB, HEPAIGM, HEPBIGM in the last 72 hours.  Studies/Results: Dg Chest 2 View  08/19/2014   CLINICAL DATA:  Shortness of breath since starting new medication on 08/06/2014, COPD, smoker, coronary disease post MI and CABG, hypertension, ischemic cardiomyopathy  EXAM: CHEST  2 VIEW  COMPARISON:  01/19/2012  FINDINGS: Enlargement of cardiac silhouette post CABG.  Slight vascular congestion.  Bibasilar effusions and atelectasis greater on RIGHT, new.  Minimal perihilar pulmonary edema.  No pneumothorax.  Bones demineralized.  IMPRESSION: Mild CHF with bibasilar effusions and atelectasis greater on RIGHT.   Electronically Signed   By: Ulyses Southward M.D.   On: 08/19/2014 16:53   Ct Angio Chest Pe W/cm &/or Wo Cm  08/19/2014   CLINICAL DATA:  Short of breath for 2 weeks. Lower extremity swelling.  EXAM: CT ANGIOGRAPHY CHEST WITH CONTRAST  TECHNIQUE: Multidetector CT imaging of the chest was performed using the standard protocol during bolus administration of intravenous contrast. Multiplanar CT image reconstructions and MIPs were obtained to evaluate the vascular anatomy.  CONTRAST:  93mL OMNIPAQUE IOHEXOL 350 MG/ML SOLN  COMPARISON:  Chest radiograph 08/19/2014.  Chest CT 01/31/2012.  FINDINGS: Bones: Age indeterminate but new T2 compression fracture with about 20% loss of anterior vertebral body height and no retropulsion. Sclerosis along the superior endplate suggests healing and chronicity. No paravertebral phlegmon. 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  Cardiovascular: Technically adequate study for evaluation of pulmonary embolism. Negative for pulmonary embolism. Aortic  atherosclerosis. Median sternotomy/ CABG. Global cardiomegaly. Mild respiratory motion degrades evaluation of the lung bases.  Lungs: Collapse/ consolidation of the lower lobes and RIGHT middle lobe. This is most compatible with compressive atelectasis associated with the pleural effusions. Interlobular septal thickening is present consistent with interstitial pulmonary edema. There is airspace disease at the apices bilaterally, compatible with alveolar edema.  Central airways: Patent.  Effusions: Moderate RIGHT and small LEFT pleural effusion. RIGHT pleural effusion tracks anteriorly along the RIGHT middle lobe with associated compressive atelectasis.  Small amount of pericardial fluid or thickening.  Lymphadenopathy: No axillary adenopathy. Small mediastinal lymph nodes are present which are probably congestive given the other findings.  Esophagus: Grossly normal.  Upper abdomen: Within normal limits.  Other: None.  Review of the MIP images confirms the above findings.  IMPRESSION: 1. Negative for pulmonary embolism. 2. Moderate CHF with RIGHT-greater-than- LEFT bilateral pleural effusions, interstitial and mild apical alveolar pulmonary edema. 3. Cardiomegaly, again consistent with CHF. 4. Age indeterminate but likely chronic T2 superior endplate compression fracture. This is new compared to CT 2013.   Electronically Signed   By: Andreas Newport M.D.   On: 08/19/2014 18:50    Assessment/Plan:  Hyponatremia  This appears possible hypervolemic although the unilateral leg  swelling may be from venous thrombosis. Euvolemic causes include SIADH . I would check thyroid functions to make sure he has no hypothroidism. No NSAIDS.   Agree free water restrict  Evaluate CXR for lung lesion. Continue IV lasix   LOS: 1 Isa Kohlenberg W @TODAY @11 :16 AM

## 2014-08-20 NOTE — Progress Notes (Signed)
Patient Demographics  Cory Dennis, is a 69 y.o. male, DOB - August 07, 1945, ENI:[id number]  Admit date - 08/19/2014   Admitting Physician Lorretta Harp, MD  Outpatient Primary MD for the patient is [facility] [name], PA-C  LOS - 1   Chief Complaint  Patient presents with  . Shortness of Breath        Subjective:   [name] today has, No headache, No chest pain, No abdominal pain - No Nausea, No new weakness tingling or numbness, mild Cough & SOB.    Assessment & Plan    1.Acute Hypoxia respiratory failure due to COPD exacerbation and possibly mild acute on chronic CHF. No previous echogram in chart, has been placed on Lasix along with IV Solu-Medrol and azithromycin, as needed and scheduled nebulizer treatment along with oxygen supplementation. BiPAP as needed. Low-salt diet with fluid restriction. Daily weight. Intake output monitor. Continue beta blocker and ACE inhibitor. Echogram pending. Cardiology consulted.   2. Smoking and alcohol abuse. Counseled to quit both. CIWA protocol, nicotine patch.   3. Hyponatremia. Urine sodium 42 which is inconclusive, urine and serum osmolality pending. Evidence of fluid overload is not overwhelming. He does have history of alcohol abuse, have requested renal to evaluate. For now continue low-dose Lasix.   4. Mild troponin rise. Due to #1 above. Upon in rise not in ACS pattern. Continue aspirin, beta blocker and statin. Cardiology to evaluate. Echo pending.   5. Hyperkalemia. Low-dose Kayexalate, continue Lasix and monitor.   6. History of CAD. Status post CABG in 2011. Chest pain free. Kindly see #4  above.      Code Status: Full  Family Communication: none  Disposition Plan: Home   Procedures   CTA chest  TTE   Consults  Cards,  renal   Medications  Scheduled Meds: . antiseptic oral rinse  7 mL Mouth Rinse q12n4p  . aspirin  81 mg Oral Daily  . atorvastatin  40 mg Oral q1800  . chlorhexidine  15 mL Mouth Rinse BID  . dextromethorphan-guaiFENesin  1 tablet Oral BID  . folic acid  1 mg Oral Daily  . furosemide  40 mg Intravenous BID  . heparin  5,000 Units Subcutaneous 3 times per day  . ipratropium-albuterol  3 mL Nebulization Q4H  . lisinopril  5 mg Oral Daily  . LORazepam  0-4 mg Intravenous Q6H   Followed by  . [START ON 08/21/2014] LORazepam  0-4 mg Intravenous Q12H  . methylPREDNISolone (SOLU-MEDROL) injection  80 mg Intravenous Q6H  . metoprolol  50 mg Oral BID  . multivitamin with minerals  1 tablet Oral Daily  . nicotine  21 mg Transdermal Daily  . perflutren lipid microspheres (DEFINITY) IV suspension      . sodium chloride  3 mL Intravenous Q12H  . thiamine  100 mg Oral Daily   Or  . thiamine  100 mg Intravenous Daily   Continuous Infusions:  PRN Meds:.sodium chloride, acetaminophen, albuterol, HYDROcodone-acetaminophen, LORazepam **OR** LORazepam, ondansetron (ZOFRAN) IV, perflutren lipid microspheres (DEFINITY) IV suspension, sodium chloride  DVT Prophylaxis  Heparin   Lab Results  Component Value Date   PLT 183 08/19/2014    Antibiotics     Anti-infectives    None  Objective:   Filed Vitals:   08/20/14 0404 08/20/14 0421 08/20/14 0800 08/20/14 0836  BP: 113/51  130/60 130/60  Pulse: 78  86 82  Temp:  99 F (37.2 C) 97.4 F (36.3 C)   TempSrc:  Oral Oral   Resp:   26 23  Height:      Weight:      SpO2: 98%  94% 94%    Wt Readings from Last 3 Encounters:  08/20/14 68.1 kg (150 lb 2.1 oz)  08/19/14 70.761 kg (156 lb)  08/05/14 68.493 kg (151 lb)     Intake/Output Summary (Last 24 hours) at 08/20/14 1029 Last data filed at 08/20/14 0900  Gross per 24 hour  Intake     43 ml  Output   1950 ml  Net  -1907 ml     Physical Exam  Awake Alert,  Oriented X 3, No new F.N deficits, Normal affect [place].AT,PERRAL Supple Neck,No JVD, No cervical lymphadenopathy appriciated.  Symmetrical Chest wall movement, Mod air movement bilaterally, diffuse wheezing and some rales RRR,No Gallops,Rubs or new Murmurs, No Parasternal Heave +ve B.Sounds, Abd Soft, No tenderness, No organomegaly appriciated, No rebound - guarding or rigidity. No Cyanosis, Clubbing or edema, No new Rash or bruise , L leg mildly swollen     Data Review   Micro Results Recent Results (from the past 240 hour(s))  MRSA PCR Screening     Status: None   Collection Time: 08/19/14  9:58 PM  Result Value Ref Range Status   MRSA by PCR NEGATIVE NEGATIVE Final    Comment:        The GeneXpert MRSA Assay (FDA approved for NASAL specimens only), is one component of a comprehensive MRSA colonization surveillance program. It is not intended to diagnose MRSA infection nor to guide or monitor treatment for MRSA infections.     Radiology Reports Dg Chest 2 View  08/19/2014   CLINICAL DATA:  Shortness of breath since starting new medication on 08/06/2014, COPD, smoker, coronary disease post MI and CABG, hypertension, ischemic cardiomyopathy  EXAM: CHEST  2 VIEW  COMPARISON:  01/19/2012  FINDINGS: Enlargement of cardiac silhouette post CABG.  Slight vascular congestion.  Bibasilar effusions and atelectasis greater on RIGHT, new.  Minimal perihilar pulmonary edema.  No pneumothorax.  Bones demineralized.  IMPRESSION: Mild CHF with bibasilar effusions and atelectasis greater on RIGHT.   Electronically Signed   By: Ulyses Southward M.D.   On: 08/19/2014 16:53   Ct Angio Chest Pe W/cm &/or Wo Cm  08/19/2014   CLINICAL DATA:  Short of breath for 2 weeks. Lower extremity swelling.  EXAM: CT ANGIOGRAPHY CHEST WITH CONTRAST  TECHNIQUE: Multidetector CT imaging of the chest was performed using the standard protocol during bolus administration of intravenous contrast. Multiplanar CT image  reconstructions and MIPs were obtained to evaluate the vascular anatomy.  CONTRAST:  80mL OMNIPAQUE IOHEXOL 350 MG/ML SOLN  COMPARISON:  Chest radiograph 08/19/2014.  Chest CT 01/31/2012.  FINDINGS: Bones: Age indeterminate but new T2 compression fracture with about 20% loss of anterior vertebral body height and no retropulsion. Sclerosis along the superior endplate suggests healing and chronicity. No paravertebral phlegmon. 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  Cardiovascular: Technically adequate study for evaluation of pulmonary embolism. Negative for pulmonary embolism. Aortic atherosclerosis. Median sternotomy/ CABG. Global cardiomegaly. Mild respiratory motion degrades evaluation of the lung bases.  Lungs: Collapse/ consolidation of the lower lobes and RIGHT middle lobe. This is most compatible with compressive  atelectasis associated with the pleural effusions. Interlobular septal thickening is present consistent with interstitial pulmonary edema. There is airspace disease at the apices bilaterally, compatible with alveolar edema.  Central airways: Patent.  Effusions: Moderate RIGHT and small LEFT pleural effusion. RIGHT pleural effusion tracks anteriorly along the RIGHT middle lobe with associated compressive atelectasis.  Small amount of pericardial fluid or thickening.  Lymphadenopathy: No axillary adenopathy. Small mediastinal lymph nodes are present which are probably congestive given the other findings.  Esophagus: Grossly normal.  Upper abdomen: Within normal limits.  Other: None.  Review of the MIP images confirms the above findings.  IMPRESSION: 1. Negative for pulmonary embolism. 2. Moderate CHF with RIGHT-greater-than- LEFT bilateral pleural effusions, interstitial and mild apical alveolar pulmonary edema. 3. Cardiomegaly, again consistent with CHF. 4. Age indeterminate but likely chronic T2 superior endplate compression fracture. This is new compared to CT 2013.    Electronically Signed   By: Andreas Newport M.D.   On: 08/19/2014 18:50     CBC  Recent Labs Lab 08/19/14 1558 08/19/14 1608  WBC 8.1  --   HGB 13.1 14.3  HCT 36.3* 42.0  PLT 183  --   MCV 88.1  --   MCH 31.8  --   MCHC 36.1*  --   RDW 13.6  --   LYMPHSABS 0.6*  --   MONOABS 0.8  --   EOSABS 0.0  --   BASOSABS 0.0  --     Chemistries   Recent Labs Lab 08/19/14 1608 08/19/14 2220 08/20/14 0405  NA 119* 120* 120*  K 5.4* 4.9 5.3*  CL 85* 85* 88*  CO2  --  24 22  GLUCOSE 119* 114* 119*  BUN CREATININE 0.80 1.01 0.89  CALCIUM  --  8.5 7.9*   ------------------------------------------------------------------------------------------------------------------ estimated creatinine clearance is 65.6 mL/min (by C-G formula based on Cr of 0.89). ------------------------------------------------------------------------------------------------------------------ No results for input(s): HGBA1C in the last 72 hours. ------------------------------------------------------------------------------------------------------------------ No results for input(s): CHOL, HDL, LDLCALC, TRIG, CHOLHDL, LDLDIRECT in the last 72 hours. ------------------------------------------------------------------------------------------------------------------  Recent Labs  08/19/14 2220  TSH 1.147   ------------------------------------------------------------------------------------------------------------------ No results for input(s): VITAMINB12, FOLATE, FERRITIN, TIBC, IRON, RETICCTPCT in the last 72 hours.  Coagulation profile No results for input(s): INR, PROTIME in the last 168 hours.  No results for input(s): DDIMER in the last 72 hours.  Cardiac Enzymes  Recent Labs Lab 08/19/14 2220 08/20/14 0405 08/20/14 0851  TROPONINI 0.05* 0.05* 0.06*   ------------------------------------------------------------------------------------------------------------------ Invalid input(s):  POCBNP     Time Spent in minutes  35   Treyson Axel K M.D on 08/20/2014 at 10:29 AM  Between 7am to 7pm - Pager - [phone]  After 7pm go to [email or web address] - password [facility]  [facility]   Office  [phone]

## 2014-08-20 NOTE — Progress Notes (Signed)
Upon entering patient room to give scheduled nebulizer treatment, patient was off of Bipap and on 4L nasal cannula, with sats of 94%.  Will continue to monitor.

## 2014-08-20 NOTE — Care Management Note (Addendum)
    Page 1 of 2   08/23/2014     3:12:14 PM CARE MANAGEMENT NOTE 08/23/2014  Patient:  Cory Dennis, Cory Dennis   Account Number:  [id number]  Date Initiated:  08/20/2014  Documentation initiated by:  Junius Creamer  Subjective/Objective Assessment:   adm w heart failure     Action/Plan:   lives alone, pcp dr Corrie Dandy dixson   Anticipated DC Date:  08/23/2014   Anticipated DC Plan:  HOME W HOME HEALTH SERVICES      DC Planning Services  CM consult      [facility]  HOME HEALTH   Choice offered to / List presented to:  C-1 Patient   DME arranged  [facility]  NEBULIZER MACHINE      DME agency  [facility]     HH arranged  HH-2 PT  HH-4 NURSE'S AIDE  HH-1 RN      [facility] agency  [facility]   Status of service:  Completed, signed off Medicare Important Message given?   (If response is "NO", the following Medicare IM given date fields will be blank) Date Medicare IM given:   Medicare IM given by:   Date Additional Medicare IM given:   Additional Medicare IM given by:    Discharge Disposition:  HOME W HOME HEALTH SERVICES  Per UR Regulation:  Reviewed for med. necessity/level of care/duration of stay  If discussed at Long Length of Stay Meetings, dates discussed:    Comments:  08/23/14 15:000 CM received call from RN to please arrange [facility] and DME.  CM called [facility] DME James to please deliver O2 and nebulizer machine to room prior to discharge today.  CM called referral to [facility] rep, Katie for HHPT/RN/resp care/aide.  No other Cm needs were communicated.  Freddy Jaksch, BSN, [phone].

## 2014-08-20 NOTE — Evaluation (Signed)
Occupational Therapy Evaluation Patient Details Name: ANASTASIO WOGAN MRN: [id number] DOB: [birth year] Today's Date: 08/20/2014    History of Present Illness Pt admitted with acute Hypoxia respiratory failure due to COPD exacerbation and possibly mild acute on chronic CHF. PMH - ETOH, CABG, MI, COPD   Clinical Impression   Pt was functioning independently prior to admission. Presents with decreased activity tolerance and impaired balance interfering with ability to perform ADL and ADL transfers.  Will follow acutely.  Expect pt will be able to discharge home.    Follow Up Recommendations  Home health OT    Equipment Recommendations  Other (comment) (TBD)    Recommendations for Other Services       Precautions / Restrictions Precautions Precautions: Fall Precaution Comments: watch sats      Mobility Bed Mobility Overal bed mobility: Needs Assistance Bed Mobility: Supine to Sit     Supine to sit: Supervision;HOB elevated     General bed mobility comments: pt up in chair  Transfers Overall transfer level: Needs assistance Equipment used: None Transfers: Sit to/from Stand Sit to Stand: Min guard         General transfer comment: Assist for balance and lines    Balance Overall balance assessment: Needs assistance Sitting-balance support: No upper extremity supported;Feet supported Sitting balance-Leahy Scale: Good       Standing balance-Leahy Scale: Fair                              ADL Overall ADL's : Needs assistance/impaired Eating/Feeding: Independent;Sitting   Grooming: Min guard;Wash/dry hands;Brushing hair;Standing   Upper Body Bathing: Set up;Sitting   Lower Body Bathing: Min guard;Sit to/from stand   Upper Body Dressing : Set up;Sitting   Lower Body Dressing: Min guard;Sit to/from stand   Toilet Transfer: Minimal assistance;Ambulation   Toileting- Clothing Manipulation and Hygiene: Min guard         General ADL  Comments: Pt on 4L 02 with sats 88-90%     Vision     Perception     Praxis      Pertinent Vitals/Pain Pain Assessment: No/denies pain     Hand Dominance Right   Extremity/Trunk Assessment Upper Extremity Assessment Upper Extremity Assessment: Overall WFL for tasks assessed   Lower Extremity Assessment Lower Extremity Assessment: Defer to PT evaluation       Communication Communication Communication: No difficulties   Cognition Arousal/Alertness: Awake/alert Behavior During Therapy: WFL for tasks assessed/performed Overall Cognitive Status: Within Functional Limits for tasks assessed                     General Comments       Exercises       Shoulder Instructions      Home Living Family/patient expects to be discharged to:: Private residence Living Arrangements: Alone Available Help at Discharge: Available PRN/intermittently;Family;Friend(s) Type of Home: House Home Access: Stairs to enter [employer] of Steps: 4 Entrance Stairs-Rails: None Home Layout: One level     Bathroom Shower/Tub: Chief Strategy Officer: Standard     Home Equipment: None          Prior Functioning/Environment Level of Independence: Independent             OT Diagnosis: Generalized weakness   OT Problem List: Decreased activity tolerance;Impaired balance (sitting and/or standing);Cardiopulmonary status limiting activity;Decreased knowledge of use of DME or AE;Increased edema   OT Treatment/Interventions: Self-care/ADL  training;Energy conservation;DME and/or AE instruction;Therapeutic activities;Patient/family education;Balance training    OT Goals(Current goals can be found in the care plan section) Acute Rehab OT Goals Patient Stated Goal: return home OT Goal Formulation: With patient Time For Goal Achievement: 08/27/14 Potential to Achieve Goals: Good ADL Goals Pt Will Perform Grooming: with supervision;standing Pt Will Perform  Lower Body Bathing: with supervision;sit to/from stand Pt Will Perform Lower Body Dressing: with supervision;sit to/from stand Pt Will Transfer to Toilet: with supervision;ambulating;regular height toilet Pt Will Perform Toileting - Clothing Manipulation and hygiene: with supervision;sit to/from stand Pt Will Perform Tub/Shower Transfer: Tub transfer;with supervision;ambulating (determine need for shower seat) Additional ADL Goal #1: Pt will utilize energy conservation strategies and breathing techniques during ADL and mobility with minimal verbal cues.  OT Frequency: Min 2X/week   Barriers to D/C:            Co-evaluation              End of Session Equipment Utilized During Treatment: Oxygen (4L)  Activity Tolerance: Patient limited by fatigue Patient left: in chair;with call bell/phone within reach   Time: 5003-7048 OT Time Calculation (min): 18 min Charges:  OT General Charges $OT Visit: 1 Procedure OT Evaluation $Initial OT Evaluation Tier I: 1 Procedure G-Codes:    Evern Bio 08/20/2014, 3:15 PM  [phone]

## 2014-08-21 DIAGNOSIS — F1010 Alcohol abuse, uncomplicated: Secondary | ICD-10-CM

## 2014-08-21 DIAGNOSIS — I509 Heart failure, unspecified: Secondary | ICD-10-CM | POA: Insufficient documentation

## 2014-08-21 LAB — LEGIONELLA ANTIGEN, URINE

## 2014-08-21 LAB — BASIC METABOLIC PANEL
Anion gap: 8 (ref 5–15)
BUN: 18 mg/dL (ref 6–23)
CALCIUM: 8.8 mg/dL (ref 8.4–10.5)
CHLORIDE: 90 mmol/L — AB (ref 96–112)
CO2: 29 mmol/L (ref 19–32)
CREATININE: 1.2 mg/dL (ref 0.50–1.35)
GFR calc Af Amer: 69 mL/min — ABNORMAL LOW (ref >90)
GFR calc non Af Amer: 60 mL/min — ABNORMAL LOW (ref >90)
GLUCOSE: 184 mg/dL — AB (ref 70–99)
Potassium: 3.4 mmol/L — ABNORMAL LOW (ref 3.5–5.1)
Sodium: 127 mmol/L — ABNORMAL LOW (ref 135–145)

## 2014-08-21 LAB — URINE CULTURE: Colony Count: 2000

## 2014-08-21 MED ORDER — POTASSIUM CHLORIDE CRYS ER 20 MEQ PO TBCR
40.0000 meq | EXTENDED_RELEASE_TABLET | ORAL | Status: AC
  Administered 2014-08-21 (×2): 40 meq via ORAL
  Filled 2014-08-21 (×2): qty 2

## 2014-08-21 MED ORDER — CHLORDIAZEPOXIDE HCL 5 MG PO CAPS
10.0000 mg | ORAL_CAPSULE | Freq: Three times a day (TID) | ORAL | Status: DC
  Administered 2014-08-21 – 2014-08-23 (×6): 10 mg via ORAL
  Filled 2014-08-21 (×6): qty 2

## 2014-08-21 NOTE — Progress Notes (Signed)
Report called to [facility]; pt bathing/eating lunch; will transfer to floor after lunch eaten per pt/RN 3E request; will continue to monitor

## 2014-08-21 NOTE — Progress Notes (Signed)
Patient Demographics  Cory Dennis, is a 69 y.o. male, DOB - 30-Jul-1945, YBW:[id number]  Admit date - 08/19/2014   Admitting Physician Lorretta Harp, MD  Outpatient Primary MD for the patient is Frazier Richards, PA-C  LOS - 2   Chief Complaint  Patient presents with  . Shortness of Breath        Subjective:   Cory Dennis today has, No headache, No chest pain, No abdominal pain - No Nausea, No new weakness tingling or numbness, mild Cough & SOB.    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 Has improved on Lasix along with IV Solu-Medrol and azithromycin, as needed and scheduled nebulizer treatment along with oxygen supplementation. Continue Low-salt diet with fluid restriction. Daily weight. Intake output monitor. Continue beta blocker and ACE inhibitor. Cardiology consulted.  Weight change: -6.486 kg (-14 lb 4.8 oz)   2. Smoking and alcohol abuse. Counseled to quit both. CIWA protocol, nicotine patch.   3. Hyponatremia. Urine sodium 42 which is inconclusive, urine and serum osmolality pending. Evidence of fluid overload improving with Lasix. Renal on board as well.   4. Mild troponin rise. Due to #1 above. Upon in rise not in ACS pattern. Continue aspirin, beta blocker and statin. Cardiology to evaluate. Echo pending.   5. Hypokalemia. Replaced.   6. History of CAD. Status post CABG in 2011. Chest pain free. Kindly see #4  above. [facility] neurology following   7. Right popliteal. Baker cyst. Discussed with orthopedic surgeon on call Dr. Roda Shutters who recommends outpatient follow-up.     Code Status: Full  Family Communication: none  Disposition Plan: Home   Procedures   CTA chest - NO  PE  TTE - Mildly dilated LV with EF 25%. Wall motion abnormalities as notedabove. No LV thrombus noted with Definity. Moderate diastolicdysfunction with evidence for elevated LV filling pressure. Normal RV size with mildly decreased systolic function. The IVC was not dilated. Mild pulmonary hypertension.   Lower extremity venous duplex  - No evidence of deep vein or superficial thrombosis involving theright lower extremity and left lower extremity. - . Incidental findings are consistent with: Baker&'s Cyst on theright. - There is another fluid collection in the distal right thigh,adjacent to the right popliteal fossa Baker&'s Cyst. - No evidence of Baker&'s cyst on the left.   Consults  Cards, renal   Medications  Scheduled Meds: . antiseptic oral rinse  7 mL Mouth Rinse q12n4p  . aspirin  81 mg Oral Daily  . atorvastatin  40 mg Oral q1800  . azithromycin  500 mg Oral Daily  . chlorhexidine  15 mL Mouth Rinse BID  . folic acid  1 mg Oral Daily  . furosemide  40 mg Intravenous BID  . heparin  5,000 Units Subcutaneous 3 times per day  . ipratropium-albuterol  3 mL Nebulization Q4H  . lisinopril  5 mg Oral Daily  . LORazepam  0-4 mg Intravenous Q6H   Followed by  . LORazepam  0-4 mg Intravenous Q12H  . methylPREDNISolone (SOLU-MEDROL) injection  80 mg Intravenous Q6H  . metoprolol  50 mg Oral BID  . multivitamin with minerals  1 tablet Oral Daily  . nicotine  21 mg Transdermal Daily  . potassium chloride  40 mEq Oral Q4H  . sodium chloride  3 mL Intravenous Q12H  . thiamine  100 mg Oral Daily   Or  . thiamine  100 mg Intravenous Daily   Continuous Infusions:  PRN Meds:.sodium chloride, acetaminophen, albuterol, guaiFENesin-dextromethorphan, HYDROcodone-acetaminophen, LORazepam **OR** LORazepam, ondansetron (ZOFRAN) IV, sodium chloride  DVT Prophylaxis  Heparin   Lab Results  Component Value Date   PLT 183 08/19/2014    Antibiotics     Anti-infectives     Start     Dose/Rate Route Frequency Ordered Stop   08/20/14 1130  azithromycin (ZITHROMAX) tablet 500 mg     500 mg Oral Daily 08/20/14 1030            Objective:   Filed Vitals:   08/21/14 0430 08/21/14 0600 08/21/14 0745 08/21/14 0848  BP: 118/49  111/74   Pulse: 80 96 72   Temp: 98 F (36.7 C)  97.4 F (36.3 C)   TempSrc: Oral  Oral   Resp: 23  23   Height:      Weight: 65 kg (143 lb 4.8 oz)     SpO2: 95%  100% 98%    Wt Readings from Last 3 Encounters:  08/21/14 65 kg (143 lb 4.8 oz)  08/19/14 70.761 kg (156 lb)  08/05/14 68.493 kg (151 lb)     Intake/Output Summary (Last 24 hours) at 08/21/14 0849 Last data filed at 08/21/14 0600  Gross per 24 hour  Intake    480 ml  Output   2920 ml  Net  -2440 ml     Physical Exam  Awake Alert, Oriented X 3, No new F.N deficits, Normal affect [place].AT,PERRAL Supple Neck,No JVD, No cervical lymphadenopathy appriciated.  Symmetrical Chest wall movement, Mod air movement bilaterally, mild wheezing , +ve rales RRR,No Gallops,Rubs or new Murmurs, No Parasternal Heave +ve B.Sounds, Abd Soft, No tenderness, No organomegaly appriciated, No rebound - guarding or rigidity. No Cyanosis, Clubbing or edema, No new Rash or bruise , L leg mildly swollen     Data Review   Micro Results Recent Results (from the past 240 hour(s))  MRSA PCR Screening     Status: None   Collection Time: 08/19/14  9:58 PM  Result Value Ref Range Status   MRSA by PCR NEGATIVE NEGATIVE Final    Comment:        The GeneXpert MRSA Assay (FDA approved for NASAL specimens only), is one component of a comprehensive MRSA colonization surveillance program. It is not intended to diagnose MRSA infection nor to guide or monitor treatment for MRSA infections.   Culture, blood (routine x 2) Call MD if unable to obtain prior to antibiotics being given     Status: None (Preliminary result)   Collection Time: 08/19/14 10:20 PM  Result Value Ref Range Status    Specimen Description BLOOD LEFT ARM  Final   Special Requests BOTTLES DRAWN AEROBIC AND ANAEROBIC EACH  Final   Culture   Final           BLOOD CULTURE RECEIVED NO GROWTH TO DATE CULTURE WILL BE HELD FOR 5 DAYS BEFORE ISSUING A FINAL NEGATIVE REPORT Performed at [facility]    Report Status PENDING  Incomplete  Culture, blood (routine x 2) Call MD if unable to obtain prior to antibiotics being given     Status: None (Preliminary result)   Collection Time:  08/19/14 10:22 PM  Result Value Ref Range Status   Specimen Description BLOOD RIGHT HAND  Final   Special Requests BOTTLES DRAWN AEROBIC AND ANAEROBIC 5CC EACH  Final   Culture   Final           BLOOD CULTURE RECEIVED NO GROWTH TO DATE CULTURE WILL BE HELD FOR 5 DAYS BEFORE ISSUING A FINAL NEGATIVE REPORT Performed at [facility]    Report Status PENDING  Incomplete  Urine culture     Status: None   Collection Time: 08/20/14  8:03 AM  Result Value Ref Range Status   Specimen Description URINE, CLEAN CATCH  Final   Special Requests NONE  Final   Colony Count   Final    2,000 COLONIES/ML Performed at [facility]    Culture   Final    INSIGNIFICANT GROWTH Performed at [facility]    Report Status 08/21/2014 FINAL  Final    Radiology Reports Dg Chest 2 View  08/19/2014   CLINICAL DATA:  Shortness of breath since starting new medication on 08/06/2014, COPD, smoker, coronary disease post MI and CABG, hypertension, ischemic cardiomyopathy  EXAM: CHEST  2 VIEW  COMPARISON:  01/19/2012  FINDINGS: Enlargement of cardiac silhouette post CABG.  Slight vascular congestion.  Bibasilar effusions and atelectasis greater on RIGHT, new.  Minimal perihilar pulmonary edema.  No pneumothorax.  Bones demineralized.  IMPRESSION: Mild CHF with bibasilar effusions and atelectasis greater on RIGHT.   Electronically Signed   By: Ulyses Southward M.D.   On: 08/19/2014 16:53   Ct Angio Chest Pe W/cm &/or Wo Cm  08/19/2014    CLINICAL DATA:  Short of breath for 2 weeks. Lower extremity swelling.  EXAM: CT ANGIOGRAPHY CHEST WITH CONTRAST  TECHNIQUE: Multidetector CT imaging of the chest was performed using the standard protocol during bolus administration of intravenous contrast. Multiplanar CT image reconstructions and MIPs were obtained to evaluate the vascular anatomy.  CONTRAST:  80mL OMNIPAQUE IOHEXOL 350 MG/ML SOLN  COMPARISON:  Chest radiograph 08/19/2014.  Chest CT 01/31/2012.  FINDINGS: Bones: Age indeterminate but new T2 compression fracture with about 20% loss of anterior vertebral body height and no retropulsion. Sclerosis along the superior endplate suggests healing and chronicity. No paravertebral phlegmon. 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  Cardiovascular: Technically adequate study for evaluation of pulmonary embolism. Negative for pulmonary embolism. Aortic atherosclerosis. Median sternotomy/ CABG. Global cardiomegaly. Mild respiratory motion degrades evaluation of the lung bases.  Lungs: Collapse/ consolidation of the lower lobes and RIGHT middle lobe. This is most compatible with compressive atelectasis associated with the pleural effusions. Interlobular septal thickening is present consistent with interstitial pulmonary edema. There is airspace disease at the apices bilaterally, compatible with alveolar edema.  Central airways: Patent.  Effusions: Moderate RIGHT and small LEFT pleural effusion. RIGHT pleural effusion tracks anteriorly along the RIGHT middle lobe with associated compressive atelectasis.  Small amount of pericardial fluid or thickening.  Lymphadenopathy: No axillary adenopathy. Small mediastinal lymph nodes are present which are probably congestive given the other findings.  Esophagus: Grossly normal.  Upper abdomen: Within normal limits.  Other: None.  Review of the MIP images confirms the above findings.  IMPRESSION: 1. Negative for pulmonary embolism. 2. Moderate CHF  with RIGHT-greater-than- LEFT bilateral pleural effusions, interstitial and mild apical alveolar pulmonary edema. 3. Cardiomegaly, again consistent with CHF. 4. Age indeterminate but likely chronic T2 superior endplate compression fracture. This is new compared to CT 2013.  Electronically Signed   By: Andreas Newport M.D.   On: 08/19/2014 18:50     CBC  Recent Labs Lab 08/19/14 1558 08/19/14 1608  WBC 8.1  --   HGB 13.1 14.3  HCT 36.3* 42.0  PLT 183  --   MCV 88.1  --   MCH 31.8  --   MCHC 36.1*  --   RDW 13.6  --   LYMPHSABS 0.6*  --   MONOABS 0.8  --   EOSABS 0.0  --   BASOSABS 0.0  --     Chemistries   Recent Labs Lab 08/19/14 1608 08/19/14 2220 08/20/14 0405 08/20/14 1503 08/21/14 0440  NA 119* 120* 120* 128* 127*  K 5.4* 4.9 5.3* 3.9 3.4*  CL 85* 85* 88* 90* 90*  CO2  --  GLUCOSE 119* 114* 119* 192* 184*  BUN CREATININE 0.80 1.01 0.89 1.21 1.20  CALCIUM  --  8.5 7.9* 8.7 8.8   ------------------------------------------------------------------------------------------------------------------ estimated creatinine clearance is 48.6 mL/min (by C-G formula based on Cr of 1.2). ------------------------------------------------------------------------------------------------------------------ No results for input(s): HGBA1C in the last 72 hours. ------------------------------------------------------------------------------------------------------------------ No results for input(s): CHOL, HDL, LDLCALC, TRIG, CHOLHDL, LDLDIRECT in the last 72 hours. ------------------------------------------------------------------------------------------------------------------  Recent Labs  08/19/14 2220  TSH 1.147   ------------------------------------------------------------------------------------------------------------------ No results for input(s): VITAMINB12, FOLATE, FERRITIN, TIBC, IRON, RETICCTPCT in the last 72 hours.  Coagulation  profile No results for input(s): INR, PROTIME in the last 168 hours.  No results for input(s): DDIMER in the last 72 hours.  Cardiac Enzymes  Recent Labs Lab 08/19/14 2220 08/20/14 0405 08/20/14 0851  TROPONINI 0.05* 0.05* 0.06*   ------------------------------------------------------------------------------------------------------------------ Invalid input(s): POCBNP     Time Spent in minutes  35   SINGH,PRASHANT K M.D on 08/21/2014 at 8:49 AM  Between 7am to 7pm - Pager - [phone]  After 7pm go to [email or web address] - password [email or web address]  [facility]   Office  [phone]

## 2014-08-21 NOTE — Progress Notes (Signed)
DAILY PROGRESS NOTE  Subjective:  Sodium is improving. Creatinine remains stable. BNP was elevated at 3689. Mild troponin elevations consistent with CHF. Diuresed about 3L negative so far. Breathing has improved.  Objective:  Temp:  [97.3 F (36.3 C)-98.3 F (36.8 C)] 97.6 F (36.4 C) (03/25 1140) Pulse Rate:  [72-96] 72 (03/25 0745) Resp:  [19-23] 21 (03/25 1140) BP: (98-124)/(46-74) 124/66 mmHg (03/25 1140) SpO2:  [85 %-100 %] 96 % (03/25 1140) FiO2 (%):  [40 %] 40 % (03/25 0430) Weight:  [143 lb 4.8 oz (65 kg)] 143 lb 4.8 oz (65 kg) (03/25 0430) Weight change: -14 lb 4.8 oz (-6.486 kg)  Intake/Output from previous day: 03/24 0701 - 03/25 0700 In: 700 [P.O.:700] Out: 2920 [Urine:2920]  Intake/Output from this shift: Total I/O In: 300 [P.O.:300] Out: -   Medications: Current Facility-Administered Medications  Medication Dose Route Frequency Provider Last Rate Last Dose  . 0.9 %  sodium chloride infusion  250 mL Intravenous PRN Ivor Costa, MD      . acetaminophen (TYLENOL) tablet 650 mg  650 mg Oral Q4H PRN Ivor Costa, MD      . albuterol (PROVENTIL) (2.5 MG/3ML) 0.083% nebulizer solution 2.5 mg  2.5 mg Nebulization Q2H PRN Ivor Costa, MD      . antiseptic oral rinse (CPC / CETYLPYRIDINIUM CHLORIDE 0.05%) solution 7 mL  7 mL Mouth Rinse q12n4p Ivor Costa, MD   7 mL at 08/20/14 1200  . aspirin chewable tablet 81 mg  81 mg Oral Daily Ivor Costa, MD   81 mg at 08/21/14 1610  . atorvastatin (LIPITOR) tablet 40 mg  40 mg Oral q1800 Ivor Costa, MD   40 mg at 08/20/14 1656  . azithromycin (ZITHROMAX) tablet 500 mg  500 mg Oral Daily Thurnell Lose, MD   500 mg at 08/21/14 0920  . chlordiazePOXIDE (LIBRIUM) capsule 10 mg  10 mg Oral TID Thurnell Lose, MD      . chlorhexidine (PERIDEX) 0.12 % solution 15 mL  15 mL Mouth Rinse BID Ivor Costa, MD   15 mL at 08/20/14 2113  . folic acid (FOLVITE) tablet 1 mg  1 mg Oral Daily Ivor Costa, MD   1 mg at 08/21/14 9604  . furosemide (LASIX)  injection 40 mg  40 mg Intravenous BID Ivor Costa, MD   40 mg at 08/21/14 0908  . guaiFENesin-dextromethorphan (ROBITUSSIN DM) 100-10 MG/5ML syrup 10 mL  10 mL Oral Q4H PRN Thurnell Lose, MD   10 mL at 08/20/14 2112  . heparin injection 5,000 Units  5,000 Units Subcutaneous 3 times per day Ivor Costa, MD   5,000 Units at 08/21/14 [phone]  . HYDROcodone-acetaminophen (NORCO/VICODIN) 5-325 MG per tablet 1 tablet  1 tablet Oral Q4H PRN Ivor Costa, MD      . ipratropium-albuterol (DUONEB) 0.5-2.5 (3) MG/3ML nebulizer solution 3 mL  3 mL Nebulization Q4H Ivor Costa, MD   3 mL at 08/21/14 1133  . lisinopril (PRINIVIL,ZESTRIL) tablet 5 mg  5 mg Oral Daily Thurnell Lose, MD   5 mg at 08/21/14 8119  . LORazepam (ATIVAN) injection 0-4 mg  0-4 mg Intravenous Q6H Ivor Costa, MD   1 mg at 08/20/14 2112   Followed by  . LORazepam (ATIVAN) injection 0-4 mg  0-4 mg Intravenous Q12H Ivor Costa, MD      . LORazepam (ATIVAN) tablet 1 mg  1 mg Oral Q6H PRN Ivor Costa, MD   1 mg at 08/21/14 [phone]  Or  . LORazepam (ATIVAN) injection 1 mg  1 mg Intravenous Q6H PRN Ivor Costa, MD      . methylPREDNISolone sodium succinate (SOLU-MEDROL) 125 mg/2 mL injection 80 mg  80 mg Intravenous Q6H Thurnell Lose, MD   80 mg at 08/21/14 0909  . metoprolol (LOPRESSOR) tablet 50 mg  50 mg Oral BID Ivor Costa, MD   50 mg at 08/21/14 0623  . multivitamin with minerals tablet 1 tablet  1 tablet Oral Daily Ivor Costa, MD   1 tablet at 08/21/14 7628  . nicotine (NICODERM CQ - dosed in mg/24 hours) patch 21 mg  21 mg Transdermal Daily Ivor Costa, MD   21 mg at 08/19/14 2258  . ondansetron (ZOFRAN) injection 4 mg  4 mg Intravenous Q6H PRN Ivor Costa, MD      . sodium chloride 0.9 % injection 3 mL  3 mL Intravenous Q12H Ivor Costa, MD   3 mL at 08/21/14 3151  . sodium chloride 0.9 % injection 3 mL  3 mL Intravenous PRN Ivor Costa, MD      . thiamine (VITAMIN B-1) tablet 100 mg  100 mg Oral Daily Ivor Costa, MD   100 mg at 08/21/14 7616   Or  .  thiamine (B-1) injection 100 mg  100 mg Intravenous Daily Ivor Costa, MD        Physical Exam: General appearance: alert and no distress Neck: JVD - 3 cm above sternal notch and no carotid bruit Lungs: diminished breath sounds bibasilar Heart: regular rate and rhythm, S1, S2 normal and systolic murmur: early systolic 3/6, crescendo at apex Abdomen: soft, non-tender; bowel sounds normal; no masses,  no organomegaly Extremities: edema 1+ bilateral Pulses: 2+ and symmetric Skin: Skin color, texture, turgor normal. No rashes or lesions Neurologic: Grossly normal Psych: Pleasant  Lab Results: Results for orders placed or performed during the hospital encounter of 08/19/14 (from the past 48 hour(s))  CBC with Differential     Status: Abnormal   Collection Time: 08/19/14  3:58 PM  Result Value Ref Range   WBC 8.1 4.0 - 10.5 K/uL   RBC 4.12 (L) 4.22 - 5.81 MIL/uL   Hemoglobin 13.1 13.0 - 17.0 g/dL   HCT 36.3 (L) 39.0 - 52.0 %   MCV 88.1 78.0 - 100.0 fL   MCH 31.8 26.0 - 34.0 pg   MCHC 36.1 (H) 30.0 - 36.0 g/dL   RDW 13.6 11.5 - 15.5 %   Platelets 183 150 - 400 K/uL   Neutrophils Relative % 82 (H) 43 - 77 %   Neutro Abs 6.7 1.7 - 7.7 K/uL   Lymphocytes Relative 8 (L) 12 - 46 %   Lymphs Abs 0.6 (L) 0.7 - 4.0 K/uL   Monocytes Relative 10 3 - 12 %   Monocytes Absolute 0.8 0.1 - 1.0 K/uL   Eosinophils Relative 0 0 - 5 %   Eosinophils Absolute 0.0 0.0 - 0.7 K/uL   Basophils Relative 0 0 - 1 %   Basophils Absolute 0.0 0.0 - 0.1 K/uL  Brain natriuretic peptide     Status: Abnormal   Collection Time: 08/19/14  3:58 PM  Result Value Ref Range   B Natriuretic Peptide 3689.7 (H) 0.0 - 100.0 pg/mL  I-Stat Troponin, ED (not at [facility])     Status: None   Collection Time: 08/19/14  4:06 PM  Result Value Ref Range   Troponin i, poc 0.01 0.00 - 0.08 ng/mL   Comment 3  Comment: Due to the release kinetics of cTnI, a negative result within the first hours of the onset of symptoms does not  rule out myocardial infarction with certainty. If myocardial infarction is still suspected, repeat the test at appropriate intervals.   I-Stat Chem 8, ED     Status: Abnormal   Collection Time: 08/19/14  4:08 PM  Result Value Ref Range   Sodium 119 (LL) 135 - 145 mmol/L   Potassium 5.4 (H) 3.5 - 5.1 mmol/L   Chloride 85 (L) 96 - 112 mmol/L   BUN 11 6 - 23 mg/dL   Creatinine, Ser 0.80 0.50 - 1.35 mg/dL   Glucose, Bld 119 (H) 70 - 99 mg/dL   Calcium, Ion 1.09 (L) 1.13 - 1.30 mmol/L   TCO2 20 0 - 100 mmol/L   Hemoglobin 14.3 13.0 - 17.0 g/dL   HCT 42.0 39.0 - 52.0 %   Comment NOTIFIED PHYSICIAN   I-Stat arterial blood gas, ED     Status: Abnormal   Collection Time: 08/19/14  7:09 PM  Result Value Ref Range   pH, Arterial 7.403 7.350 - 7.450   pCO2 arterial 32.2 (L) 35.0 - 45.0 mmHg   pO2, Arterial 65.0 (L) 80.0 - 100.0 mmHg   Bicarbonate 20.3 20.0 - 24.0 mEq/L   TCO2 21 0 - 100 mmol/L   O2 Saturation 94.0 %   Acid-base deficit 4.0 (H) 0.0 - 2.0 mmol/L   Patient temperature 97.0 F    Collection site RADIAL, ALLEN'S TEST ACCEPTABLE    Drawn by RT    Sample type ARTERIAL   MRSA PCR Screening     Status: None   Collection Time: 08/19/14  9:58 PM  Result Value Ref Range   MRSA by PCR NEGATIVE NEGATIVE    Comment:        The GeneXpert MRSA Assay (FDA approved for NASAL specimens only), is one component of a comprehensive MRSA colonization surveillance program. It is not intended to diagnose MRSA infection nor to guide or monitor treatment for MRSA infections.   Troponin I (q 6hr x 3)     Status: Abnormal   Collection Time: 08/19/14 10:20 PM  Result Value Ref Range   Troponin I 0.05 (H) <0.031 ng/mL    Comment:        PERSISTENTLY INCREASED TROPONIN VALUES IN THE RANGE OF 0.04-0.49 ng/mL CAN BE SEEN IN:       -UNSTABLE ANGINA       -CONGESTIVE HEART FAILURE       -MYOCARDITIS       -CHEST TRAUMA       -ARRYHTHMIAS       -LATE PRESENTING MYOCARDIAL INFARCTION        -COPD   CLINICAL FOLLOW-UP RECOMMENDED.   Basic metabolic panel     Status: Abnormal   Collection Time: 08/19/14 10:20 PM  Result Value Ref Range   Sodium 120 (L) 135 - 145 mmol/L   Potassium 4.9 3.5 - 5.1 mmol/L   Chloride 85 (L) 96 - 112 mmol/L   CO2 24 19 - 32 mmol/L   Glucose, Bld 114 (H) 70 - 99 mg/dL   BUN 11 6 - 23 mg/dL   Creatinine, Ser 1.01 0.50 - 1.35 mg/dL   Calcium 8.5 8.4 - 10.5 mg/dL   GFR calc non Af Amer 74 (L) >90 mL/min   GFR calc Af Amer 86 (L) >90 mL/min    Comment: (NOTE) The eGFR has been calculated using the CKD EPI equation.  This calculation has not been validated in all clinical situations. eGFR's persistently <90 mL/min signify possible Chronic Kidney Disease.    Anion gap 11 5 - 15  TSH     Status: None   Collection Time: 08/19/14 10:20 PM  Result Value Ref Range   TSH 1.147 0.350 - 4.500 uIU/mL  Culture, blood (routine x 2) Call MD if unable to obtain prior to antibiotics being given     Status: None (Preliminary result)   Collection Time: 08/19/14 10:20 PM  Result Value Ref Range   Specimen Description BLOOD LEFT ARM    Special Requests BOTTLES DRAWN AEROBIC AND ANAEROBIC 5ML EACH    Culture             BLOOD CULTURE RECEIVED NO GROWTH TO DATE CULTURE WILL BE HELD FOR 5 DAYS BEFORE ISSUING A FINAL NEGATIVE REPORT Performed at [facility]    Report Status PENDING   HIV antibody     Status: None   Collection Time: 08/19/14 10:20 PM  Result Value Ref Range   HIV Screen 4th Generation wRfx Non Reactive Non Reactive    Comment: (NOTE) Performed At: [facility] [place], [place] [id number] Lindon Romp MD [id number]   Culture, blood (routine x 2) Call MD if unable to obtain prior to antibiotics being given     Status: None (Preliminary result)   Collection Time: 08/19/14 10:22 PM  Result Value Ref Range   Specimen Description BLOOD RIGHT HAND    Special Requests BOTTLES DRAWN AEROBIC AND ANAEROBIC 5CC  EACH    Culture             BLOOD CULTURE RECEIVED NO GROWTH TO DATE CULTURE WILL BE HELD FOR 5 DAYS BEFORE ISSUING A FINAL NEGATIVE REPORT Performed at [facility]    Report Status PENDING   Influenza panel by PCR (type A & B, H1N1)     Status: None   Collection Time: 08/19/14 10:32 PM  Result Value Ref Range   Influenza A By PCR NEGATIVE NEGATIVE   Influenza B By PCR NEGATIVE NEGATIVE   H1N1 flu by pcr NOT DETECTED NOT DETECTED    Comment:        The Xpert Flu assay (FDA approved for nasal aspirates or washes and nasopharyngeal swab specimens), is intended as an aid in the diagnosis of influenza and should not be used as a sole basis for treatment.   Strep pneumoniae urinary antigen     Status: None   Collection Time: 08/19/14 11:02 PM  Result Value Ref Range   Strep Pneumo Urinary Antigen NEGATIVE NEGATIVE    Comment:        Infection due to S. pneumoniae cannot be absolutely ruled out since the antigen present may be below the detection limit of the test.   Troponin I (q 6hr x 3)     Status: Abnormal   Collection Time: 08/20/14  4:05 AM  Result Value Ref Range   Troponin I 0.05 (H) <0.031 ng/mL    Comment:        PERSISTENTLY INCREASED TROPONIN VALUES IN THE RANGE OF 0.04-0.49 ng/mL CAN BE SEEN IN:       -UNSTABLE ANGINA       -CONGESTIVE HEART FAILURE       -MYOCARDITIS       -CHEST TRAUMA       -ARRYHTHMIAS       -LATE PRESENTING MYOCARDIAL INFARCTION       -  COPD   CLINICAL FOLLOW-UP RECOMMENDED.   Basic metabolic panel     Status: Abnormal   Collection Time: 08/20/14  4:05 AM  Result Value Ref Range   Sodium 120 (L) 135 - 145 mmol/L   Potassium 5.3 (H) 3.5 - 5.1 mmol/L   Chloride 88 (L) 96 - 112 mmol/L   CO2 22 19 - 32 mmol/L   Glucose, Bld 119 (H) 70 - 99 mg/dL   BUN 13 6 - 23 mg/dL   Creatinine, Ser 0.89 0.50 - 1.35 mg/dL   Calcium 7.9 (L) 8.4 - 10.5 mg/dL   GFR calc non Af Amer 85 (L) >90 mL/min   GFR calc Af Amer >90 >90 mL/min     Comment: (NOTE) The eGFR has been calculated using the CKD EPI equation. This calculation has not been validated in all clinical situations. eGFR's persistently <90 mL/min signify possible Chronic Kidney Disease.    Anion gap 10 5 - 15  Creatinine, urine, random     Status: None   Collection Time: 08/20/14  8:03 AM  Result Value Ref Range   Creatinine, Urine 38.58 mg/dL  Osmolality, urine     Status: Abnormal   Collection Time: 08/20/14  8:03 AM  Result Value Ref Range   Osmolality, Ur 276 (L) 390 - 1090 mOsm/kg    Comment: Performed at [facility]  Sodium, urine, random     Status: None   Collection Time: 08/20/14  8:03 AM  Result Value Ref Range   Sodium, Ur 42 mmol/L  Urine culture     Status: None   Collection Time: 08/20/14  8:03 AM  Result Value Ref Range   Specimen Description URINE, CLEAN CATCH    Special Requests NONE    Colony Count      2,000 COLONIES/ML Performed at [facility]    Culture      INSIGNIFICANT GROWTH Performed at [facility]    Report Status 08/21/2014 FINAL   Urinalysis, Routine w reflex microscopic     Status: Abnormal   Collection Time: 08/20/14  8:03 AM  Result Value Ref Range   Color, Urine YELLOW YELLOW   APPearance CLEAR CLEAR   Specific Gravity, Urine 1.017 1.005 - 1.030   pH 6.0 5.0 - 8.0   Glucose, UA NEGATIVE NEGATIVE mg/dL   Hgb urine dipstick NEGATIVE NEGATIVE   Bilirubin Urine NEGATIVE NEGATIVE   Ketones, ur 15 (A) NEGATIVE mg/dL   Protein, ur NEGATIVE NEGATIVE mg/dL   Urobilinogen, UA 1.0 0.0 - 1.0 mg/dL   Nitrite NEGATIVE NEGATIVE   Leukocytes, UA NEGATIVE NEGATIVE    Comment: MICROSCOPIC NOT DONE ON URINES WITH NEGATIVE PROTEIN, BLOOD, LEUKOCYTES, NITRITE, OR GLUCOSE <1000 mg/dL.  Troponin I (q 6hr x 3)     Status: Abnormal   Collection Time: 08/20/14  8:51 AM  Result Value Ref Range   Troponin I 0.06 (H) <0.031 ng/mL    Comment:        PERSISTENTLY INCREASED TROPONIN VALUES IN THE RANGE OF  0.04-0.49 ng/mL CAN BE SEEN IN:       -UNSTABLE ANGINA       -CONGESTIVE HEART FAILURE       -MYOCARDITIS       -CHEST TRAUMA       -ARRYHTHMIAS       -LATE PRESENTING MYOCARDIAL INFARCTION       -COPD   CLINICAL FOLLOW-UP RECOMMENDED.   Osmolality     Status: Abnormal   Collection Time:  08/20/14  8:51 AM  Result Value Ref Range   Osmolality 259 (L) 275 - 300 mOsm/kg    Comment: Performed at [place] metabolic panel     Status: Abnormal   Collection Time: 08/20/14  3:03 PM  Result Value Ref Range   Sodium 128 (L) 135 - 145 mmol/L   Potassium 3.9 3.5 - 5.1 mmol/L   Chloride 90 (L) 96 - 112 mmol/L   CO2 24 19 - 32 mmol/L   Glucose, Bld 192 (H) 70 - 99 mg/dL   BUN 16 6 - 23 mg/dL   Creatinine, Ser 1.21 0.50 - 1.35 mg/dL   Calcium 8.7 8.4 - 10.5 mg/dL   GFR calc non Af Amer 59 (L) >90 mL/min   GFR calc Af Amer 69 (L) >90 mL/min    Comment: (NOTE) The eGFR has been calculated using the CKD EPI equation. This calculation has not been validated in all clinical situations. eGFR's persistently <90 mL/min signify possible Chronic Kidney Disease.    Anion gap 14 5 - 15  Basic metabolic panel     Status: Abnormal   Collection Time: 08/21/14  4:40 AM  Result Value Ref Range   Sodium 127 (L) 135 - 145 mmol/L   Potassium 3.4 (L) 3.5 - 5.1 mmol/L   Chloride 90 (L) 96 - 112 mmol/L   CO2 29 19 - 32 mmol/L   Glucose, Bld 184 (H) 70 - 99 mg/dL   BUN 18 6 - 23 mg/dL   Creatinine, Ser 1.20 0.50 - 1.35 mg/dL   Calcium 8.8 8.4 - 10.5 mg/dL   GFR calc non Af Amer 60 (L) >90 mL/min   GFR calc Af Amer 69 (L) >90 mL/min    Comment: (NOTE) The eGFR has been calculated using the CKD EPI equation. This calculation has not been validated in all clinical situations. eGFR's persistently <90 mL/min signify possible Chronic Kidney Disease.    Anion gap 8 5 - 15    Imaging: Dg Chest 2 View  08/19/2014   CLINICAL DATA:  Shortness of breath since starting new medication on  08/06/2014, COPD, smoker, coronary disease post MI and CABG, hypertension, ischemic cardiomyopathy  EXAM: CHEST  2 VIEW  COMPARISON:  01/19/2012  FINDINGS: Enlargement of cardiac silhouette post CABG.  Slight vascular congestion.  Bibasilar effusions and atelectasis greater on RIGHT, new.  Minimal perihilar pulmonary edema.  No pneumothorax.  Bones demineralized.  IMPRESSION: Mild CHF with bibasilar effusions and atelectasis greater on RIGHT.   Electronically Signed   By: Lavonia Dana M.D.   On: 08/19/2014 16:53   Ct Angio Chest Pe W/cm &/or Wo Cm  08/19/2014   CLINICAL DATA:  Short of breath for 2 weeks. Lower extremity swelling.  EXAM: CT ANGIOGRAPHY CHEST WITH CONTRAST  TECHNIQUE: Multidetector CT imaging of the chest was performed using the standard protocol during bolus administration of intravenous contrast. Multiplanar CT image reconstructions and MIPs were obtained to evaluate the vascular anatomy.  CONTRAST:  45m OMNIPAQUE IOHEXOL 350 MG/ML SOLN  COMPARISON:  Chest radiograph 08/19/2014.  Chest CT 01/31/2012.  FINDINGS: Bones: Age indeterminate but new T2 compression fracture with about 20% loss of anterior vertebral body height and no retropulsion. Sclerosis along the superior endplate suggests healing and chronicity. No paravertebral phlegmon. 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  Cardiovascular: Technically adequate study for evaluation of pulmonary embolism. Negative for pulmonary embolism. Aortic atherosclerosis. Median sternotomy/ CABG. Global cardiomegaly. Mild respiratory motion degrades evaluation  of the lung bases.  Lungs: Collapse/ consolidation of the lower lobes and RIGHT middle lobe. This is most compatible with compressive atelectasis associated with the pleural effusions. Interlobular septal thickening is present consistent with interstitial pulmonary edema. There is airspace disease at the apices bilaterally, compatible with alveolar edema.  Central airways:  Patent.  Effusions: Moderate RIGHT and small LEFT pleural effusion. RIGHT pleural effusion tracks anteriorly along the RIGHT middle lobe with associated compressive atelectasis.  Small amount of pericardial fluid or thickening.  Lymphadenopathy: No axillary adenopathy. Small mediastinal lymph nodes are present which are probably congestive given the other findings.  Esophagus: Grossly normal.  Upper abdomen: Within normal limits.  Other: None.  Review of the MIP images confirms the above findings.  IMPRESSION: 1. Negative for pulmonary embolism. 2. Moderate CHF with RIGHT-greater-than- LEFT bilateral pleural effusions, interstitial and mild apical alveolar pulmonary edema. 3. Cardiomegaly, again consistent with CHF. 4. Age indeterminate but likely chronic T2 superior endplate compression fracture. This is new compared to CT 2013.   Electronically Signed   By: Dereck Ligas M.D.   On: 08/19/2014 18:50    Assessment:  1. Principal Problem: 2.   CHF exacerbation 3. Active Problems: 4.   CAD (coronary artery disease) 5.   Tobacco abuse 6.   COPD exacerbation 7.   Hyponatremia 8.   S/P CABG (coronary artery bypass graft) 9.   Acute respiratory failure with hypoxia 10.   Hyperkalemia 11.   Alcohol abuse 12.   Congestive heart disease 13.   Plan:  1. Acute on chronic systolic congestive heart failure-  Continue IV diuretics. Hyponatremia is improving. Renal function appears stable. Has been offered an AICD in the past, but declined. Consider switching lopressor to Toprol XL 100 mg daily. If possible, will try to uptitrate lisinopril. He is hypokalemic, but has also been hyperkalemic - would consider adding spironolactone 12.5 mg daily if he is persistently hypokalemic.. 2.   Elevated troponin - findings more consistent with CHF or possible demand ischemia - would not recommend cardiac cath.  Time Spent Directly with Patient:  15 minutes  Length of Stay:  LOS: 2 days   Pixie Casino, MD,  [facility] Attending Cardiologist [facility]  HILTY,Kenneth C 08/21/2014, 1:19 PM

## 2014-08-21 NOTE — Progress Notes (Signed)
Physical Therapy Treatment Patient Details Name: Cory Dennis MRN: [id number] DOB: 1945-07-29 Today's Date: Aug 23, 2014    History of Present Illness Pt admitted with acute Hypoxia respiratory failure due to COPD exacerbation and possibly mild acute on chronic CHF. PMH - ETOH, CABG, MI, COPD    PT Comments    Patient making gains with mobility.  Limited session today - patient needed to be cleaned and bed changed.  Agree with need for HHPT.  Follow Up Recommendations  Home health PT;Supervision - Intermittent     Equipment Recommendations  Other (comment) (TBD)    Recommendations for Other Services       Precautions / Restrictions Precautions Precautions: Fall Precaution Comments: watch sats Restrictions Weight Bearing Restrictions: No    Mobility  Bed Mobility Overal bed mobility: Needs Assistance Bed Mobility: Sit to Supine       Sit to supine: Min guard   General bed mobility comments: Patient gets into bed knee first, going forward from standing.  Reviewed safety with patient.  Transfers Overall transfer level: Needs assistance Equipment used: None Transfers: Sit to/from Stand Sit to Stand: Min guard         General transfer comment: Assist for safety/balance  Ambulation/Gait Ambulation/Gait assistance: Min assist Ambulation Distance (Feet): 60 Feet Assistive device: None Gait Pattern/deviations: Step-through pattern;Decreased stride length;Shuffle;Drifts right/left Gait velocity: Decreased Gait velocity interpretation: Below normal speed for age/gender General Gait Details: Patient with unsteady gait, requiring assist for balance.   Stairs            Wheelchair Mobility    Modified Rankin (Stroke Patients Only)       Balance                                    Cognition Arousal/Alertness: Awake/alert Behavior During Therapy: Impulsive Overall Cognitive Status:  (Decreased safety awareness.  Pt sitting in  urine-didn't call)                      Exercises      General Comments General comments (skin integrity, edema, etc.): RN called to room to assist with cleaning bed and patient.  Patient sat in straight chair with min guard assist as bed being changed and floor cleaned.      Pertinent Vitals/Pain Pain Assessment: No/denies pain    Home Living                      Prior Function            PT Goals (current goals can now be found in the care plan section) Progress towards PT goals: Progressing toward goals    Frequency  Min 3X/week    PT Plan Current plan remains appropriate    Co-evaluation             End of Session Equipment Utilized During Treatment: Gait belt;Oxygen Activity Tolerance: Patient limited by fatigue Patient left: in bed;with call bell/phone within reach;with bed alarm set     Time: 5056-9794 PT Time Calculation (min) (ACUTE ONLY): 16 min  Charges:  $Gait Training: 8-22 mins                    G Codes:      Vena Austria Aug 23, 2014, 6:35 PM Durenda Hurt. Renaldo Fiddler, [facility] Acute Rehab Services Pager [phone]

## 2014-08-21 NOTE — Progress Notes (Signed)
[facility] ROUNDING NOTE   Subjective:   Interval History: no complaints eating breakfast  Sone at bedside "Tim"   Objective:  Vital signs in last 24 hours:  Temp:  [97.3 F (36.3 C)-98.3 F (36.8 C)] 97.4 F (36.3 C) (03/25 0745) Pulse Rate:  [72-96] 72 (03/25 0745) Resp:  [19-24] 23 (03/25 0745) BP: (98-118)/(46-74) 111/74 mmHg (03/25 0745) SpO2:  [85 %-100 %] 98 % (03/25 0848) FiO2 (%):  [40 %] 40 % (03/25 0430) Weight:  [65 kg (143 lb 4.8 oz)] 65 kg (143 lb 4.8 oz) (03/25 0430)  Weight change: -6.486 kg (-14 lb 4.8 oz) Filed Weights   08/19/14 2130 08/20/14 0107 08/21/14 0430  Weight: 68.1 kg (150 lb 2.1 oz) 68.1 kg (150 lb 2.1 oz) 65 kg (143 lb 4.8 oz)    Intake/Output: I/O last 3 completed shifts: In: 523 [P.O.:523] Out: 3670 [Urine:3670]   Intake/Output this shift:     CVS- RRR RS- CTA ABD- BS present soft non-distended EXT- no edema   Basic Metabolic Panel:  Recent Labs Lab 08/19/14 1608  08/19/14 2220 08/20/14 0405 08/20/14 1503 08/21/14 0440  NA 119*  --  120* 120* 128* 127*  K 5.4*  --  4.9 5.3* 3.9 3.4*  CL 85*  --  85* 88* 90* 90*  CO2  --   --  GLUCOSE 119*  --  114* 119* 192* 184*  BUN 11  --  CREATININE 0.80  --  1.01 0.89 1.21 1.20  CALCIUM  --   < > 8.5 7.9* 8.7 8.8  < > = values in this interval not displayed.  Liver Function Tests: No results for input(s): AST, ALT, ALKPHOS, BILITOT, PROT, ALBUMIN in the last 168 hours. No results for input(s): LIPASE, AMYLASE in the last 168 hours. No results for input(s): AMMONIA in the last 168 hours.  CBC:  Recent Labs Lab 08/19/14 1558 08/19/14 1608  WBC 8.1  --   NEUTROABS 6.7  --   HGB 13.1 14.3  HCT 36.3* 42.0  MCV 88.1  --   PLT 183  --     Cardiac Enzymes:  Recent Labs Lab 08/19/14 2220 08/20/14 0405 08/20/14 0851  TROPONINI 0.05* 0.05* 0.06*    BNP: Invalid input(s): POCBNP  CBG: No results for input(s): GLUCAP in the  last 168 hours.  Microbiology: Results for orders placed or performed during the hospital encounter of 08/19/14  MRSA PCR Screening     Status: None   Collection Time: 08/19/14  9:58 PM  Result Value Ref Range Status   MRSA by PCR NEGATIVE NEGATIVE Final    Comment:        The GeneXpert MRSA Assay (FDA approved for NASAL specimens only), is one component of a comprehensive MRSA colonization surveillance program. It is not intended to diagnose MRSA infection nor to guide or monitor treatment for MRSA infections.   Culture, blood (routine x 2) Call MD if unable to obtain prior to antibiotics being given     Status: None (Preliminary result)   Collection Time: 08/19/14 10:20 PM  Result Value Ref Range Status   Specimen Description BLOOD LEFT ARM  Final   Special Requests BOTTLES DRAWN AEROBIC AND ANAEROBIC EACH  Final   Culture   Final           BLOOD CULTURE RECEIVED NO GROWTH TO DATE CULTURE WILL BE HELD FOR 5 DAYS BEFORE ISSUING A FINAL  NEGATIVE REPORT Performed at [facility]    Report Status PENDING  Incomplete  Culture, blood (routine x 2) Call MD if unable to obtain prior to antibiotics being given     Status: None (Preliminary result)   Collection Time: 08/19/14 10:22 PM  Result Value Ref Range Status   Specimen Description BLOOD RIGHT HAND  Final   Special Requests BOTTLES DRAWN AEROBIC AND ANAEROBIC 5CC EACH  Final   Culture   Final           BLOOD CULTURE RECEIVED NO GROWTH TO DATE CULTURE WILL BE HELD FOR 5 DAYS BEFORE ISSUING A FINAL NEGATIVE REPORT Performed at [facility]    Report Status PENDING  Incomplete  Urine culture     Status: None   Collection Time: 08/20/14  8:03 AM  Result Value Ref Range Status   Specimen Description URINE, CLEAN CATCH  Final   Special Requests NONE  Final   Colony Count   Final    2,000 COLONIES/ML Performed at [facility]    Culture   Final    INSIGNIFICANT GROWTH Performed at [facility]    Report Status 08/21/2014 FINAL  Final    Coagulation Studies: No results for input(s): LABPROT, INR in the last 72 hours.  Urinalysis:  Recent Labs  08/20/14 0803  COLORURINE YELLOW  LABSPEC 1.017  PHURINE 6.0  GLUCOSEU NEGATIVE  HGBUR NEGATIVE  BILIRUBINUR NEGATIVE  KETONESUR 15*  PROTEINUR NEGATIVE  UROBILINOGEN 1.0  NITRITE NEGATIVE  LEUKOCYTESUR NEGATIVE      Imaging: Dg Chest 2 View  08/19/2014   CLINICAL DATA:  Shortness of breath since starting new medication on 08/06/2014, COPD, smoker, coronary disease post MI and CABG, hypertension, ischemic cardiomyopathy  EXAM: CHEST  2 VIEW  COMPARISON:  01/19/2012  FINDINGS: Enlargement of cardiac silhouette post CABG.  Slight vascular congestion.  Bibasilar effusions and atelectasis greater on RIGHT, new.  Minimal perihilar pulmonary edema.  No pneumothorax.  Bones demineralized.  IMPRESSION: Mild CHF with bibasilar effusions and atelectasis greater on RIGHT.   Electronically Signed   By: Ulyses Southward M.D.   On: 08/19/2014 16:53   Ct Angio Chest Pe W/cm &/or Wo Cm  08/19/2014   CLINICAL DATA:  Short of breath for 2 weeks. Lower extremity swelling.  EXAM: CT ANGIOGRAPHY CHEST WITH CONTRAST  TECHNIQUE: Multidetector CT imaging of the chest was performed using the standard protocol during bolus administration of intravenous contrast. Multiplanar CT image reconstructions and MIPs were obtained to evaluate the vascular anatomy.  CONTRAST:  80mL OMNIPAQUE IOHEXOL 350 MG/ML SOLN  COMPARISON:  Chest radiograph 08/19/2014.  Chest CT 01/31/2012.  FINDINGS: Bones: Age indeterminate but new T2 compression fracture with about 20% loss of anterior vertebral body height and no retropulsion. Sclerosis along the superior endplate suggests healing and chronicity. No paravertebral phlegmon. 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  Cardiovascular: Technically adequate study for evaluation of pulmonary embolism. Negative  for pulmonary embolism. Aortic atherosclerosis. Median sternotomy/ CABG. Global cardiomegaly. Mild respiratory motion degrades evaluation of the lung bases.  Lungs: Collapse/ consolidation of the lower lobes and RIGHT middle lobe. This is most compatible with compressive atelectasis associated with the pleural effusions. Interlobular septal thickening is present consistent with interstitial pulmonary edema. There is airspace disease at the apices bilaterally, compatible with alveolar edema.  Central airways: Patent.  Effusions: Moderate RIGHT and small LEFT pleural effusion. RIGHT pleural effusion tracks anteriorly along the RIGHT middle lobe with  associated compressive atelectasis.  Small amount of pericardial fluid or thickening.  Lymphadenopathy: No axillary adenopathy. Small mediastinal lymph nodes are present which are probably congestive given the other findings.  Esophagus: Grossly normal.  Upper abdomen: Within normal limits.  Other: None.  Review of the MIP images confirms the above findings.  IMPRESSION: 1. Negative for pulmonary embolism. 2. Moderate CHF with RIGHT-greater-than- LEFT bilateral pleural effusions, interstitial and mild apical alveolar pulmonary edema. 3. Cardiomegaly, again consistent with CHF. 4. Age indeterminate but likely chronic T2 superior endplate compression fracture. This is new compared to CT 2013.   Electronically Signed   By: Andreas Newport M.D.   On: 08/19/2014 18:50     Medications:     . antiseptic oral rinse  7 mL Mouth Rinse q12n4p  . aspirin  81 mg Oral Daily  . atorvastatin  40 mg Oral q1800  . azithromycin  500 mg Oral Daily  . chlordiazePOXIDE  10 mg Oral TID  . chlorhexidine  15 mL Mouth Rinse BID  . folic acid  1 mg Oral Daily  . furosemide  40 mg Intravenous BID  . heparin  5,000 Units Subcutaneous 3 times per day  . ipratropium-albuterol  3 mL Nebulization Q4H  . lisinopril  5 mg Oral Daily  . LORazepam  0-4 mg Intravenous Q6H   Followed by  .  LORazepam  0-4 mg Intravenous Q12H  . methylPREDNISolone (SOLU-MEDROL) injection  80 mg Intravenous Q6H  . metoprolol  50 mg Oral BID  . multivitamin with minerals  1 tablet Oral Daily  . nicotine  21 mg Transdermal Daily  . potassium chloride  40 mEq Oral Q4H  . sodium chloride  3 mL Intravenous Q12H  . thiamine  100 mg Oral Daily   Or  . thiamine  100 mg Intravenous Daily   sodium chloride, acetaminophen, albuterol, guaiFENesin-dextromethorphan, HYDROcodone-acetaminophen, LORazepam **OR** LORazepam, ondansetron (ZOFRAN) IV, sodium chloride  Assessment/ Plan:   Hyponatremia    Low osm and low sodium would indicate a low solute diet and high hypotonic liquid intake. The lasix is helping lower sodium osm . I think this will resolve in a day or two. I am concerned about the lack social support that Mr Suntken with alcohol and tobacco abuse.  I will sign off  Please call if I can be of any help  [phone]   LOS: 2 Constantina Laseter W @TODAY @9 :55 AM

## 2014-08-21 NOTE — Progress Notes (Signed)
Occupational Therapy Treatment Patient Details Name: Cory Dennis MRN: [id number] DOB: 03-Dec-1945 Today's Date: 08/21/2014    History of present illness Pt admitted with acute Hypoxia respiratory failure due to COPD exacerbation and possibly mild acute on chronic CHF. PMH - ETOH, CABG, MI, COPD   OT comments  Pt progressing with OT demonstrating improved activity tolerance.  He did have one LOB requiring min A to recover.  Will continue to follow.  Follow Up Recommendations  Home health OT    Equipment Recommendations  Other (comment) (TBD)    Recommendations for Other Services      Precautions / Restrictions Precautions Precautions: Fall Precaution Comments: watch sats       Mobility Bed Mobility                  Transfers Overall transfer level: Needs assistance Equipment used: None;Rolling walker (2 wheeled) Transfers: Sit to/from Stand;Stand Pivot Transfers Sit to Stand: Min guard Stand pivot transfers: Min assist       General transfer comment: assist for balance     Balance Overall balance assessment: Needs assistance Sitting-balance support: Feet supported Sitting balance-Leahy Scale: Good       Standing balance-Leahy Scale: Poor Standing balance comment: required min A to maintain standing balance                    ADL       Grooming: Wash/dry hands;Wash/dry face;Minimal assistance;Standing                   Toilet Transfer: Minimal assistance;Ambulation;Comfort height toilet;RW   Toileting- Clothing Manipulation and Hygiene: Minimal assistance;Sit to/from stand         General ADL Comments: Pt with LOB anteriorly while performing grooming in standing       Vision                     Perception     Praxis      Cognition   Behavior During Therapy: [facility] for tasks assessed/performed Overall Cognitive Status: Within Functional Limits for tasks assessed                       Extremity/Trunk  Assessment               Exercises     Shoulder Instructions       General Comments      Pertinent Vitals/ Pain       Pain Assessment: No/denies pain  Home Living                                          Prior Functioning/Environment              Frequency Min 2X/week     Progress Toward Goals  OT Goals(current goals can now be found in the care plan section)  Progress towards OT goals: Progressing toward goals  ADL Goals Pt Will Perform Grooming: with supervision;standing Pt Will Perform Lower Body Bathing: with supervision;sit to/from stand Pt Will Perform Lower Body Dressing: with supervision;sit to/from stand Pt Will Transfer to Toilet: with supervision;ambulating;regular height toilet Pt Will Perform Toileting - Clothing Manipulation and hygiene: with supervision;sit to/from stand Pt Will Perform Tub/Shower Transfer: Tub transfer;with supervision;ambulating Additional ADL Goal #1: Pt will utilize energy conservation strategies and breathing techniques during ADL and mobility  with minimal verbal cues.  Plan Discharge plan remains appropriate    Co-evaluation                 End of Session Equipment Utilized During Treatment: Oxygen   Activity Tolerance Patient tolerated treatment well   Patient Left in chair;with call bell/phone within reach;with nursing/sitter in room   Nurse Communication Mobility status        Time: 1093-2355 OT Time Calculation (min): 18 min  Charges: OT General Charges $OT Visit: 1 Procedure OT Treatments $Therapeutic Activity: 8-22 mins  Sharmeka Palmisano M 08/21/2014, 1:43 PM

## 2014-08-21 NOTE — Progress Notes (Signed)
Delay in pt transfer due to cardiology at bedside and OT at bedside working with pt; RN 3E aware;

## 2014-08-22 LAB — BASIC METABOLIC PANEL
ANION GAP: 9 (ref 5–15)
BUN: 22 mg/dL (ref 6–23)
CALCIUM: 9 mg/dL (ref 8.4–10.5)
CHLORIDE: 93 mmol/L — AB (ref 96–112)
CO2: 33 mmol/L — ABNORMAL HIGH (ref 19–32)
CREATININE: 0.94 mg/dL (ref 0.50–1.35)
GFR calc Af Amer: >90 mL/min (ref >90)
GFR calc non Af Amer: 83 mL/min — ABNORMAL LOW (ref >90)
Glucose, Bld: 161 mg/dL — ABNORMAL HIGH (ref 70–99)
POTASSIUM: 3.7 mmol/L (ref 3.5–5.1)
Sodium: 135 mmol/L (ref 135–145)

## 2014-08-22 LAB — RESPIRATORY VIRUS PANEL
ADENOVIRUS: NEGATIVE
INFLUENZA B 1: NEGATIVE
Influenza A: NEGATIVE
Metapneumovirus: NEGATIVE
PARAINFLUENZA 1 A: NEGATIVE
Parainfluenza 2: NEGATIVE
Parainfluenza 3: NEGATIVE
RHINOVIRUS: NEGATIVE
Respiratory Syncytial Virus A: NEGATIVE
Respiratory Syncytial Virus B: NEGATIVE

## 2014-08-22 MED ORDER — SPIRONOLACTONE 12.5 MG HALF TABLET
12.5000 mg | ORAL_TABLET | Freq: Every day | ORAL | Status: DC
  Administered 2014-08-22 – 2014-08-23 (×2): 12.5 mg via ORAL
  Filled 2014-08-22 (×2): qty 1

## 2014-08-22 MED ORDER — METHYLPREDNISOLONE SODIUM SUCC 125 MG IJ SOLR
60.0000 mg | Freq: Every day | INTRAMUSCULAR | Status: DC
  Administered 2014-08-22 – 2014-08-23 (×2): 60 mg via INTRAVENOUS
  Filled 2014-08-22: qty 0.96

## 2014-08-22 MED ORDER — FUROSEMIDE 40 MG PO TABS
40.0000 mg | ORAL_TABLET | Freq: Every day | ORAL | Status: DC
  Administered 2014-08-23: 40 mg via ORAL
  Filled 2014-08-22: qty 1

## 2014-08-22 MED ORDER — METOPROLOL SUCCINATE ER 100 MG PO TB24
100.0000 mg | ORAL_TABLET | Freq: Every day | ORAL | Status: DC
  Administered 2014-08-23: 100 mg via ORAL
  Filled 2014-08-22: qty 1

## 2014-08-22 NOTE — Progress Notes (Signed)
SATURATION QUALIFICATIONS: (This note is used to comply with regulatory documentation for home oxygen)  Patient Saturations on Room Air at Rest = 90%  Patient Saturations on Room Air while Ambulating = 82%  Patient Saturations on 4 Liters of oxygen while Ambulating = 92%  Please briefly explain why patient needs home oxygen: Patient desaturates quickly when he sits on side of bed without oxygen- to 88%, upon standing or walking he further desaturates to 82-83% without oxygen.

## 2014-08-22 NOTE — Plan of Care (Signed)
Problem: Phase I Progression Outcomes Goal: Dyspnea controlled at rest (HF) Outcome: Not Progressing Desats without oxygen even at rest.

## 2014-08-22 NOTE — Progress Notes (Addendum)
DAILY PROGRESS NOTE  Subjective:  Sodium has normalized. Renal function is stable.   Objective:  Temp:  [97.1 F (36.2 C)-97.8 F (36.6 C)] 97.1 F (36.2 C) (03/26 0540) Pulse Rate:  [69-95] 82 (03/26 0540) Resp:  [18-22] 22 (03/26 0540) BP: (97-119)/(48-62) 112/62 mmHg (03/26 0540) SpO2:  [88 %-100 %] 96 % (03/26 1238) Weight:  [139 lb 8 oz (63.277 kg)] 139 lb 8 oz (63.277 kg) (03/26 0540) Weight change: -3 lb 12.8 oz (-1.724 kg)  Intake/Output from previous day: 03/25 0701 - 03/26 0700 In: 1310 [P.O.:1310] Out: 2425 [Urine:2425]  Intake/Output from this shift: Total I/O In: 120 [P.O.:120] Out: -   Medications: Current Facility-Administered Medications  Medication Dose Route Frequency Provider Last Rate Last Dose  . 0.9 %  sodium chloride infusion  250 mL Intravenous PRN Ivor Costa, MD      . acetaminophen (TYLENOL) tablet 650 mg  650 mg Oral Q4H PRN Ivor Costa, MD      . albuterol (PROVENTIL) (2.5 MG/3ML) 0.083% nebulizer solution 2.5 mg  2.5 mg Nebulization Q2H PRN Ivor Costa, MD      . antiseptic oral rinse (CPC / CETYLPYRIDINIUM CHLORIDE 0.05%) solution 7 mL  7 mL Mouth Rinse q12n4p Ivor Costa, MD   7 mL at 08/20/14 1200  . aspirin chewable tablet 81 mg  81 mg Oral Daily Ivor Costa, MD   81 mg at 08/22/14 1100  . atorvastatin (LIPITOR) tablet 40 mg  40 mg Oral q1800 Ivor Costa, MD   40 mg at 08/21/14 1609  . azithromycin (ZITHROMAX) tablet 500 mg  500 mg Oral Daily Thurnell Lose, MD   500 mg at 08/22/14 1100  . chlordiazePOXIDE (LIBRIUM) capsule 10 mg  10 mg Oral TID Thurnell Lose, MD   10 mg at 08/22/14 1101  . chlorhexidine (PERIDEX) 0.12 % solution 15 mL  15 mL Mouth Rinse BID Ivor Costa, MD   15 mL at 08/22/14 0851  . folic acid (FOLVITE) tablet 1 mg  1 mg Oral Daily Ivor Costa, MD   1 mg at 08/22/14 1100  . furosemide (LASIX) injection 40 mg  40 mg Intravenous BID Ivor Costa, MD   40 mg at 08/22/14 0850  . guaiFENesin-dextromethorphan (ROBITUSSIN DM) 100-10  MG/5ML syrup 10 mL  10 mL Oral Q4H PRN Thurnell Lose, MD   10 mL at 08/20/14 2112  . heparin injection 5,000 Units  5,000 Units Subcutaneous 3 times per day Ivor Costa, MD   5,000 Units at 08/22/14 0518  . HYDROcodone-acetaminophen (NORCO/VICODIN) 5-325 MG per tablet 1 tablet  1 tablet Oral Q4H PRN Ivor Costa, MD      . ipratropium-albuterol (DUONEB) 0.5-2.5 (3) MG/3ML nebulizer solution 3 mL  3 mL Nebulization Q4H Ivor Costa, MD   3 mL at 08/22/14 1238  . lisinopril (PRINIVIL,ZESTRIL) tablet 5 mg  5 mg Oral Daily Thurnell Lose, MD   5 mg at 08/22/14 1100  . LORazepam (ATIVAN) injection 0-4 mg  0-4 mg Intravenous Q12H Ivor Costa, MD   1 mg at 08/21/14 2117  . LORazepam (ATIVAN) tablet 1 mg  1 mg Oral Q6H PRN Ivor Costa, MD   1 mg at 08/21/14 0920   Or  . LORazepam (ATIVAN) injection 1 mg  1 mg Intravenous Q6H PRN Ivor Costa, MD      . Derrill Memo ON 08/23/2014] methylPREDNISolone sodium succinate (SOLU-MEDROL) 125 mg/2 mL injection 60 mg  60 mg Intravenous Daily Thurnell Lose, MD      .  metoprolol (LOPRESSOR) tablet 50 mg  50 mg Oral BID Ivor Costa, MD   50 mg at 08/22/14 1100  . multivitamin with minerals tablet 1 tablet  1 tablet Oral Daily Ivor Costa, MD   1 tablet at 08/22/14 1100  . nicotine (NICODERM CQ - dosed in mg/24 hours) patch 21 mg  21 mg Transdermal Daily Ivor Costa, MD   21 mg at 08/22/14 1101  . ondansetron (ZOFRAN) injection 4 mg  4 mg Intravenous Q6H PRN Ivor Costa, MD      . sodium chloride 0.9 % injection 3 mL  3 mL Intravenous Q12H Ivor Costa, MD   3 mL at 08/21/14 2105  . sodium chloride 0.9 % injection 3 mL  3 mL Intravenous PRN Ivor Costa, MD   3 mL at 08/22/14 0851  . spironolactone (ALDACTONE) tablet 12.5 mg  12.5 mg Oral Daily Thurnell Lose, MD   12.5 mg at 08/22/14 1059  . thiamine (VITAMIN B-1) tablet 100 mg  100 mg Oral Daily Ivor Costa, MD   100 mg at 08/22/14 1059   Or  . thiamine (B-1) injection 100 mg  100 mg Intravenous Daily Ivor Costa, MD        Physical  Exam: General appearance: alert and no distress Neck: JVD - 3 cm above sternal notch and no carotid bruit Lungs: diminished breath sounds bibasilar Heart: regular rate and rhythm, S1, S2 normal and systolic murmur: early systolic 3/6, crescendo at apex Abdomen: soft, non-tender; bowel sounds normal; no masses,  no organomegaly Extremities: edema 1+ bilateral Pulses: 2+ and symmetric Skin: Skin color, texture, turgor normal. No rashes or lesions Neurologic: Grossly normal Psych: Pleasant  Lab Results: Results for orders placed or performed during the hospital encounter of 08/19/14 (from the past 48 hour(s))  Basic metabolic panel     Status: Abnormal   Collection Time: 08/20/14  3:03 PM  Result Value Ref Range   Sodium 128 (L) 135 - 145 mmol/L   Potassium 3.9 3.5 - 5.1 mmol/L   Chloride 90 (L) 96 - 112 mmol/L   CO2 24 19 - 32 mmol/L   Glucose, Bld 192 (H) 70 - 99 mg/dL   BUN 16 6 - 23 mg/dL   Creatinine, Ser 1.21 0.50 - 1.35 mg/dL   Calcium 8.7 8.4 - 10.5 mg/dL   GFR calc non Af Amer 59 (L) >90 mL/min   GFR calc Af Amer 69 (L) >90 mL/min    Comment: (NOTE) The eGFR has been calculated using the CKD EPI equation. This calculation has not been validated in all clinical situations. eGFR's persistently <90 mL/min signify possible Chronic Kidney Disease.    Anion gap 14 5 - 15  Basic metabolic panel     Status: Abnormal   Collection Time: 08/21/14  4:40 AM  Result Value Ref Range   Sodium 127 (L) 135 - 145 mmol/L   Potassium 3.4 (L) 3.5 - 5.1 mmol/L   Chloride 90 (L) 96 - 112 mmol/L   CO2 29 19 - 32 mmol/L   Glucose, Bld 184 (H) 70 - 99 mg/dL   BUN 18 6 - 23 mg/dL   Creatinine, Ser 1.20 0.50 - 1.35 mg/dL   Calcium 8.8 8.4 - 10.5 mg/dL   GFR calc non Af Amer 60 (L) >90 mL/min   GFR calc Af Amer 69 (L) >90 mL/min    Comment: (NOTE) The eGFR has been calculated using the CKD EPI equation. This calculation has not been validated in all  clinical situations. eGFR's persistently  <90 mL/min signify possible Chronic Kidney Disease.    Anion gap 8 5 - 15  Basic metabolic panel     Status: Abnormal   Collection Time: 08/22/14  3:50 AM  Result Value Ref Range   Sodium 135 135 - 145 mmol/L    Comment: DELTA CHECK NOTED   Potassium 3.7 3.5 - 5.1 mmol/L   Chloride 93 (L) 96 - 112 mmol/L   CO2 33 (H) 19 - 32 mmol/L   Glucose, Bld 161 (H) 70 - 99 mg/dL   BUN 22 6 - 23 mg/dL   Creatinine, Ser 0.94 0.50 - 1.35 mg/dL   Calcium 9.0 8.4 - 10.5 mg/dL   GFR calc non Af Amer 83 (L) >90 mL/min   GFR calc Af Amer >90 >90 mL/min    Comment: (NOTE) The eGFR has been calculated using the CKD EPI equation. This calculation has not been validated in all clinical situations. eGFR's persistently <90 mL/min signify possible Chronic Kidney Disease.    Anion gap 9 5 - 15    Imaging: No results found.  Assessment:  Principal Problem:   CHF exacerbation Active Problems:   CAD (coronary artery disease)   Tobacco abuse   COPD exacerbation   Hyponatremia   S/P CABG (coronary artery bypass graft)   Acute respiratory failure with hypoxia   Hyperkalemia   Alcohol abuse   Congestive heart disease   Plan:  1.   Acute on chronic systolic congestive heart failure-  4L negative. Hyponatremia has resolved. Renal function appears stable if not further improved.Marland Kitchen Has been offered an AICD in the past, but declined. Switch lopressor Toprol XL 100 mg daily tomorrow. Change lasix to 40 mg po daily tomorrow. Add low dose aldactone 12.5 mg daily.  2.   Elevated troponin - findings more consistent with CHF or possible demand ischemia - would not recommend cardiac cath.  D/c Foley today.  Possible d/c home tomorrow.  Time Spent Directly with Patient:  15 minutes  Length of Stay:  LOS: 3 days   Pixie Casino, MD, [facility] Attending Cardiologist [facility]  HILTY,Kenneth C 08/22/2014, 1:07 PM

## 2014-08-22 NOTE — Progress Notes (Signed)
Patient Demographics  Cory Dennis, is a 69 y.o. male, DOB - January 09, 1946, ZOX:[id number]  Admit date - 08/19/2014   Admitting Physician Lorretta Harp, MD  Outpatient Primary MD for the patient is Frazier Richards, PA-C  LOS - 3   Chief Complaint  Patient presents with  . Shortness of Breath        Subjective:   Cory Dennis today has, No headache, No chest pain, No abdominal pain - No Nausea, No new weakness tingling or numbness, mild Cough & SOB.    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 Has improved on Lasix along with IV Solu-Medrol (taper) and azithromycin, as needed and scheduled nebulizer treatment along with oxygen supplementation. Continue Low-salt diet with fluid restriction. Daily weight. Intake output monitor. Continue Lasix, beta blocker and ACE inhibitor. Cardiology consulted.  Weight change: -1.724 kg (-3 lb 12.8 oz)   2. Smoking and alcohol abuse. Counseled to quit both. CIWA protocol, nicotine patch.   3. Hyponatremia. Urine sodium 42 which is inconclusive, urine and serum osmolality pending. Evidence of fluid overload improving with Lasix. Renal on board as well.   4. Mild troponin rise. Due to #1 above. Upon in rise not in ACS pattern. Continue aspirin, beta blocker and statin. Cardiology following. Echo noted.   5. Hypokalemia. Replaced.   6. History of CAD. Status post CABG in 2011. Chest pain free. Kindly see #4  above. Cardiology following.   7. Right popliteal. Baker cyst. Discussed with orthopedic surgeon on call Dr. Roda Shutters who recommends outpatient follow-up.     Code Status: Full  Family Communication: none  Disposition Plan: Home   Procedures   CTA chest  - NO PE  TTE - Mildly dilated LV with EF 25%. Wall motion abnormalities as notedabove. No LV thrombus noted with Definity. Moderate diastolicdysfunction with evidence for elevated LV filling pressure. Normal RV size with mildly decreased systolic function. The IVC was not dilated. Mild pulmonary hypertension.   Lower extremity venous duplex  - No evidence of deep vein or superficial thrombosis involving theright lower extremity and left lower extremity. - . Incidental findings are consistent with: Baker&'s Cyst on theright. - There is another fluid collection in the distal right thigh,adjacent to the right popliteal fossa Baker&'s Cyst. - No evidence of Baker&'s cyst on the left.   Consults  Cards, renal   Medications  Scheduled Meds: . antiseptic oral rinse  7 mL Mouth Rinse q12n4p  . aspirin  81 mg Oral Daily  . atorvastatin  40 mg Oral q1800  . azithromycin  500 mg Oral Daily  . chlordiazePOXIDE  10 mg Oral TID  . chlorhexidine  15 mL Mouth Rinse BID  . folic acid  1 mg Oral Daily  . furosemide  40 mg Intravenous BID  . heparin  5,000 Units Subcutaneous 3 times per day  . ipratropium-albuterol  3 mL Nebulization Q4H  . lisinopril  5 mg Oral Daily  . LORazepam  0-4 mg Intravenous Q12H  . methylPREDNISolone (SOLU-MEDROL) injection  80 mg Intravenous Q6H  . metoprolol  50 mg Oral BID  . multivitamin with minerals  1 tablet Oral Daily  .  nicotine  21 mg Transdermal Daily  . sodium chloride  3 mL Intravenous Q12H  . spironolactone  12.5 mg Oral Daily  . thiamine  100 mg Oral Daily   Or  . thiamine  100 mg Intravenous Daily   Continuous Infusions:  PRN Meds:.sodium chloride, acetaminophen, albuterol, guaiFENesin-dextromethorphan, HYDROcodone-acetaminophen, LORazepam **OR** LORazepam, ondansetron (ZOFRAN) IV, sodium chloride  DVT Prophylaxis  Heparin   Lab Results  Component Value Date   PLT 183 08/19/2014    Antibiotics     Anti-infectives    Start      Dose/Rate Route Frequency Ordered Stop   08/20/14 1130  azithromycin (ZITHROMAX) tablet 500 mg     500 mg Oral Daily 08/20/14 1030            Objective:   Filed Vitals:   08/22/14 0149 08/22/14 0400 08/22/14 0540 08/22/14 0853  BP: 97/52  112/62   Pulse: 73  82   Temp: 97.4 F (36.3 C)  97.1 F (36.2 C)   TempSrc: Oral  Axillary   Resp: 18  22   Height:      Weight:   63.277 kg (139 lb 8 oz)   SpO2: 100% 93% 96% 96%    Wt Readings from Last 3 Encounters:  08/22/14 63.277 kg (139 lb 8 oz)  08/19/14 70.761 kg (156 lb)  08/05/14 68.493 kg (151 lb)     Intake/Output Summary (Last 24 hours) at 08/22/14 0932 Last data filed at 08/22/14 0602  Gross per 24 hour  Intake   1010 ml  Output   2425 ml  Net  -1415 ml     Physical Exam  Awake Alert, Oriented X 3, No new F.N deficits, Normal affect [place].AT,PERRAL Supple Neck,No JVD, No cervical lymphadenopathy appriciated.  Symmetrical Chest wall movement, Mod air movement bilaterally, mild wheezing , +ve rales RRR,No Gallops,Rubs or new Murmurs, No Parasternal Heave +ve B.Sounds, Abd Soft, No tenderness, No organomegaly appriciated, No rebound - guarding or rigidity. No Cyanosis, Clubbing or edema, No new Rash or bruise , L leg mildly swollen     Data Review   Micro Results Recent Results (from the past 240 hour(s))  MRSA PCR Screening     Status: None   Collection Time: 08/19/14  9:58 PM  Result Value Ref Range Status   MRSA by PCR NEGATIVE NEGATIVE Final    Comment:        The GeneXpert MRSA Assay (FDA approved for NASAL specimens only), is one component of a comprehensive MRSA colonization surveillance program. It is not intended to diagnose MRSA infection nor to guide or monitor treatment for MRSA infections.   Culture, blood (routine x 2) Call MD if unable to obtain prior to antibiotics being given     Status: None (Preliminary result)   Collection Time: 08/19/14 10:20 PM  Result Value Ref Range Status    Specimen Description BLOOD LEFT ARM  Final   Special Requests BOTTLES DRAWN AEROBIC AND ANAEROBIC EACH  Final   Culture   Final           BLOOD CULTURE RECEIVED NO GROWTH TO DATE CULTURE WILL BE HELD FOR 5 DAYS BEFORE ISSUING A FINAL NEGATIVE REPORT Performed at [facility]    Report Status PENDING  Incomplete  Culture, blood (routine x 2) Call MD if unable to obtain prior to antibiotics being given     Status: None (Preliminary result)   Collection Time: 08/19/14 10:22 PM  Result Value Ref Range  Status   Specimen Description BLOOD RIGHT HAND  Final   Special Requests BOTTLES DRAWN AEROBIC AND ANAEROBIC 5CC EACH  Final   Culture   Final           BLOOD CULTURE RECEIVED NO GROWTH TO DATE CULTURE WILL BE HELD FOR 5 DAYS BEFORE ISSUING A FINAL NEGATIVE REPORT Performed at [facility]    Report Status PENDING  Incomplete  Urine culture     Status: None   Collection Time: 08/20/14  8:03 AM  Result Value Ref Range Status   Specimen Description URINE, CLEAN CATCH  Final   Special Requests NONE  Final   Colony Count   Final    2,000 COLONIES/ML Performed at [facility]    Culture   Final    INSIGNIFICANT GROWTH Performed at [facility]    Report Status 08/21/2014 FINAL  Final    Radiology Reports Dg Chest 2 View  08/19/2014   CLINICAL DATA:  Shortness of breath since starting new medication on 08/06/2014, COPD, smoker, coronary disease post MI and CABG, hypertension, ischemic cardiomyopathy  EXAM: CHEST  2 VIEW  COMPARISON:  01/19/2012  FINDINGS: Enlargement of cardiac silhouette post CABG.  Slight vascular congestion.  Bibasilar effusions and atelectasis greater on RIGHT, new.  Minimal perihilar pulmonary edema.  No pneumothorax.  Bones demineralized.  IMPRESSION: Mild CHF with bibasilar effusions and atelectasis greater on RIGHT.   Electronically Signed   By: Ulyses Southward M.D.   On: 08/19/2014 16:53   Ct Angio Chest Pe W/cm &/or Wo Cm  08/19/2014    CLINICAL DATA:  Short of breath for 2 weeks. Lower extremity swelling.  EXAM: CT ANGIOGRAPHY CHEST WITH CONTRAST  TECHNIQUE: Multidetector CT imaging of the chest was performed using the standard protocol during bolus administration of intravenous contrast. Multiplanar CT image reconstructions and MIPs were obtained to evaluate the vascular anatomy.  CONTRAST:  80mL OMNIPAQUE IOHEXOL 350 MG/ML SOLN  COMPARISON:  Chest radiograph 08/19/2014.  Chest CT 01/31/2012.  FINDINGS: Bones: Age indeterminate but new T2 compression fracture with about 20% loss of anterior vertebral body height and no retropulsion. Sclerosis along the superior endplate suggests healing and chronicity. No paravertebral phlegmon. 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  Cardiovascular: Technically adequate study for evaluation of pulmonary embolism. Negative for pulmonary embolism. Aortic atherosclerosis. Median sternotomy/ CABG. Global cardiomegaly. Mild respiratory motion degrades evaluation of the lung bases.  Lungs: Collapse/ consolidation of the lower lobes and RIGHT middle lobe. This is most compatible with compressive atelectasis associated with the pleural effusions. Interlobular septal thickening is present consistent with interstitial pulmonary edema. There is airspace disease at the apices bilaterally, compatible with alveolar edema.  Central airways: Patent.  Effusions: Moderate RIGHT and small LEFT pleural effusion. RIGHT pleural effusion tracks anteriorly along the RIGHT middle lobe with associated compressive atelectasis.  Small amount of pericardial fluid or thickening.  Lymphadenopathy: No axillary adenopathy. Small mediastinal lymph nodes are present which are probably congestive given the other findings.  Esophagus: Grossly normal.  Upper abdomen: Within normal limits.  Other: None.  Review of the MIP images confirms the above findings.  IMPRESSION: 1. Negative for pulmonary embolism. 2. Moderate CHF  with RIGHT-greater-than- LEFT bilateral pleural effusions, interstitial and mild apical alveolar pulmonary edema. 3. Cardiomegaly, again consistent with CHF. 4. Age indeterminate but likely chronic T2 superior endplate compression fracture. This is new compared to CT 2013.   Electronically Signed   By: Andreas Newport  M.D.   On: 08/19/2014 18:50     CBC  Recent Labs Lab 08/19/14 1558 08/19/14 1608  WBC 8.1  --   HGB 13.1 14.3  HCT 36.3* 42.0  PLT 183  --   MCV 88.1  --   MCH 31.8  --   MCHC 36.1*  --   RDW 13.6  --   LYMPHSABS 0.6*  --   MONOABS 0.8  --   EOSABS 0.0  --   BASOSABS 0.0  --     Chemistries   Recent Labs Lab 08/19/14 2220 08/20/14 0405 08/20/14 1503 08/21/14 0440 08/22/14 0350  NA 120* 120* 128* 127* 135  K 4.9 5.3* 3.9 3.4* 3.7  CL 85* 88* 90* 90* 93*  CO2 24 22 24 29  33*  GLUCOSE 114* 119* 192* 184* 161*  BUN 11 13 16 18 22   CREATININE 1.01 0.89 1.21 1.20 0.94  CALCIUM 8.5 7.9* 8.7 8.8 9.0   ------------------------------------------------------------------------------------------------------------------ estimated creatinine clearance is 62.1 mL/min (by C-G formula based on Cr of 0.94). ------------------------------------------------------------------------------------------------------------------ No results for input(s): HGBA1C in the last 72 hours. ------------------------------------------------------------------------------------------------------------------ No results for input(s): CHOL, HDL, LDLCALC, TRIG, CHOLHDL, LDLDIRECT in the last 72 hours. ------------------------------------------------------------------------------------------------------------------  Recent Labs  08/19/14 2220  TSH 1.147   ------------------------------------------------------------------------------------------------------------------ No results for input(s): VITAMINB12, FOLATE, FERRITIN, TIBC, IRON, RETICCTPCT in the last 72 hours.  Coagulation profile No  results for input(s): INR, PROTIME in the last 168 hours.  No results for input(s): DDIMER in the last 72 hours.  Cardiac Enzymes  Recent Labs Lab 08/19/14 2220 08/20/14 0405 08/20/14 0851  TROPONINI 0.05* 0.05* 0.06*   ------------------------------------------------------------------------------------------------------------------ Invalid input(s): POCBNP     Time Spent in minutes  35   SINGH,PRASHANT K M.D on 08/22/2014 at 9:32 AM  Between 7am to 7pm - Pager - [phone]  After 7pm go to [email or web address] - password [facility]  [facility]   Office  [phone]

## 2014-08-23 MED ORDER — PREDNISONE 5 MG PO TABS: ORAL_TABLET | ORAL | Status: DC

## 2014-08-23 MED ORDER — IPRATROPIUM-ALBUTEROL 0.5-2.5 (3) MG/3ML IN SOLN: 3.0000 mL | RESPIRATORY_TRACT | Status: DC

## 2014-08-23 MED ORDER — THIAMINE HCL 100 MG PO TABS: 100.0000 mg | ORAL_TABLET | Freq: Every day | ORAL | Status: DC

## 2014-08-23 MED ORDER — TIOTROPIUM BROMIDE MONOHYDRATE 18 MCG IN CAPS: 18.0000 ug | ORAL_CAPSULE | Freq: Every day | RESPIRATORY_TRACT | Status: DC

## 2014-08-23 MED ORDER — LISINOPRIL 10 MG PO TABS: 10.0000 mg | ORAL_TABLET | Freq: Every day | ORAL | Status: DC

## 2014-08-23 MED ORDER — FUROSEMIDE 40 MG PO TABS: 40.0000 mg | ORAL_TABLET | Freq: Every day | ORAL | Status: DC

## 2014-08-23 MED ORDER — METOPROLOL SUCCINATE ER 100 MG PO TB24: 100.0000 mg | ORAL_TABLET | Freq: Every day | ORAL | Status: DC

## 2014-08-23 MED ORDER — AZITHROMYCIN 500 MG PO TABS: 500.0000 mg | ORAL_TABLET | Freq: Every day | ORAL | Status: DC

## 2014-08-23 NOTE — Discharge Summary (Signed)
Cory Dennis, is a 69 y.o. male  DOB 1946-03-08  MRN [id number].  Admission date:  08/19/2014  Admitting Physician  Lorretta Harp, MD  Discharge Date:  08/23/2014   Primary MD  Frazier Richards, PA-C  Recommendations for primary care physician for things to follow:   Check CBC, BMP and a 2 view chest x-ray in a week   Must follow with below mentioned subspecialists within 2-3 weeks   Admission Diagnosis  Hypoxia [R09.02] Acute congestive heart failure, unspecified congestive heart failure type [I50.9]   Discharge Diagnosis  Hypoxia [R09.02] Acute congestive heart failure, unspecified congestive heart failure type [I50.9]    Principal Problem:   CHF exacerbation Active Problems:   CAD (coronary artery disease)   Tobacco abuse   COPD exacerbation   Hyponatremia   S/P CABG (coronary artery bypass graft)   Acute respiratory failure with hypoxia   Hyperkalemia   Alcohol abuse   Congestive heart disease      Past Medical History  Diagnosis Date  . Myocardial infarction   . Musculoskeletal chest pain   . Fatigue   . SOB (shortness of breath)   . Arthritis   . Anxiety   . Depression   . Syncope   . Ischemic cardiomyopathy   . COPD (chronic obstructive pulmonary disease)   . Hypertension   . Tobacco abuse   . Alcohol abuse   . CAD (coronary artery disease)   . Anemia   . Emphysema of lung   . CHF (congestive heart failure)     Past Surgical History  Procedure Laterality Date  . Coronary artery bypass graft  2011    4v CABG [facility] in [place] in 06/2009 with LIMA to LAD, RIMA to distal RCA, sequential SVG to OM1/OM 2  . Left ventricular aneurysm repair         History of present illness and  Hospital Course:     Kindly see H&P for history of present illness and admission details,  please review complete Labs, Consult reports and Test reports for all details in brief  HPI  from the history and physical done on the day of admission  Cory Dennis is a 69 y.o. male with past medical history of COPD, tobacco abuse, alcohol abuse, coronary artery disease, hypertension, anxiety, CHF, who presents with worsening shortness of breath and left leg swelling.  Patient reports that since starting Chantix 2 weeks ago he has been becoming increasingly short of breath. His left leg is more swollen. He reports he coughs and brings up clear mucus. He denies any blood in the sputum. He denies chest pain or fever. The patient reports that he visited his doctor today. He was sent immediately to the emergency department for further assessment. He reports he was a long-time smoker and just gave up until very recently. He reports he drinks about 6-8 beers a day. Patient denies fever, chills, abdominal pain, diarrhea, dysuria, urgency, frequency, hematuria, skin rashes. No unilateral weakness, numbness or tingling sensations. No vision  change or hearing loss. Patient had oxygen desaturation to 88% in the emergency room, which improved to above 90% after started BiPAP.  In ED, patient was found to have elevated BNP 3689, negative troponin, hyperkalemia with potassium 5.4, hyponatremia with sodium 119, WBC 8.1, temperature normal, ABG showed pH 7.4, PCO2 32.2, PO2 65. Chest x-ray showed congestive heart failure changes. CT angiogram is negative for pulmonary embolism, but showed congestive heart failure changes (right is worse than the left side).     Hospital Course     1.Acute Hypoxia respiratory failure due to COPD exacerbation and Acute on chronic systolic and diastolic CHF EF 16%. Has improved after IV Lasix along with IV Solu-Medrol (tapered) and azithromycin, as needed and scheduled nebulizer treatment along with oxygen supplementation. Continue Low-salt diet with fluid restriction. Daily  weight. Much improved and wants to sign out AMA if not discharged. Will be discharged on oral Lasix, beta blocker and ACE inhibitor. Azithromycin for 5 more days, will get home oxygen, nebulizer machine and nebulizer medication along with Spiriva. We will have him follow with cardiology and pulmonary in the outpatient setting post discharge.   2. Smoking and alcohol abuse. Counseled to quit both. On Chantix placed on folic acid and thiamine no sign of DTs.   3. Hyponatremia. Due to fluid overload resolved with Lasix seen by renal.   4. Mild troponin rise. Due to #1 above. Upon in rise not in ACS pattern. Continue aspirin, beta blocker and statin. Cardiology saw the patient and recommended outpatient follow-up. Placed on aspirin-beta blocker and follow with cardiology. Chest pain-free throughout.   5. Hypokalemia. Replaced and stable.   6. History of CAD. Status post CABG in 2011. Chest pain free. Kindly see #4 above. Cardiology saw the patient he recommended outpatient follow-up.   7. Right popliteal. Baker cyst. Discussed with orthopedic surgeon on call Dr. Roda Shutters who recommends outpatient follow-up    Discharge Condition: Stable   Follow UP  Follow-up Information    Follow up with [facility] [name], PA-C. Schedule an appointment as soon as possible for a visit in 1 week.   Specialty:  Physician Assistant   Contact information:   [address] [place] [address] [phone]       Follow up with HILTY,Kenneth C, MD. Schedule an appointment as soon as possible for a visit in 1 week.   Specialty:  Cardiology   Why:  CHF   Contact information:   [address] [place] [address] [place] [address] [phone]       Follow up with [facility], MD. Schedule an appointment as soon as possible for a visit in 1 week.   Specialty:  Pulmonary Disease   Why:  COPD   Contact information:   [address] [place] [address] [phone]       Follow up with Cheral Almas, MD. Schedule an appointment as soon as possible for a visit in 1 week.   Specialty:  Orthopedic Surgery   Why:  Baker's cyst right leg   Contact information:   [address] [place] [address] [phone]         Discharge Instructions  and  Discharge Medications         Discharge Instructions    Diet - low sodium heart healthy    Complete by:  As directed      Discharge instructions    Complete by:  As directed   Follow with Primary MD  DIXON,MARY [name], PA-C in 7 days   Get CBC, CMP, 2 view Chest X ray checked  by Primary MD next visit.    Activity: As tolerated with Full fall precautions use walker/cane & assistance as needed   Disposition Home     Diet: Heart Healthy    Check your Weight same time everyday, if you gain over 2 pounds, or you develop in leg swelling, experience more shortness of breath or chest pain, call your Primary MD immediately. Follow Cardiac Low Salt Diet and 1.5 lit/day fluid restriction.   On your next visit with your primary care physician please Get Medicines reviewed and adjusted.   Please request your Prim.MD to go over all Hospital Tests and Procedure/Radiological results at the follow up, please get all Hospital records sent to your Prim MD by signing hospital release before you go home.   If you experience worsening of your admission symptoms, develop shortness of breath, life threatening emergency, suicidal or homicidal thoughts you must seek medical attention immediately by calling 911 or calling your MD immediately  if symptoms less severe.  You Must read complete instructions/literature along with all the possible adverse reactions/side effects for all the Medicines you take and that have been prescribed to you. Take any new Medicines after you have completely understood and accpet all the possible adverse reactions/side effects.   Do not drive, operating heavy machinery, perform activities at heights,  swimming or participation in water activities or provide baby sitting services if your were admitted for syncope or siezures until you have seen by Primary MD or a Neurologist and advised to do so again.  Do not drive when taking Pain medications.    Do not take more than prescribed Pain, Sleep and Anxiety Medications  Special Instructions: If you have smoked or chewed Tobacco  in the last 2 yrs please stop smoking, stop any regular Alcohol  and or any Recreational drug use.  Wear Seat belts while driving.   Please note  You were cared for by a hospitalist during your hospital stay. If you have any questions about your discharge medications or the care you received while you were in the hospital after you are discharged, you can call the unit and asked to speak with the hospitalist on call if the hospitalist that took care of you is not available. Once you are discharged, your primary care physician will handle any further medical issues. Please note that NO REFILLS for any discharge medications will be authorized once you are discharged, as it is imperative that you return to your primary care physician (or establish a relationship with a primary care physician if you do not have one) for your aftercare needs so that they can reassess your need for medications and monitor your lab values.     Increase activity slowly    Complete by:  As directed             Medication List    STOP taking these medications        metoprolol 50 MG tablet  Commonly known as:  LOPRESSOR      TAKE these medications        albuterol 108 (90 BASE) MCG/ACT inhaler  Commonly known as:  PROVENTIL HFA;VENTOLIN HFA  Inhale 2 puffs into the lungs every 6 (six) hours as needed for shortness of breath.     aspirin 81 MG tablet  Take 81 mg by mouth daily.     azithromycin 500 MG tablet  Commonly known as:  ZITHROMAX  Take 1 tablet (500 mg total) by mouth daily.     budesonide-formoterol 160-4.5 MCG/ACT  inhaler  Commonly known as:  SYMBICORT  Inhale 2 puffs into the lungs 2 (two) times daily.     folic acid 1 MG tablet  Commonly known as:  FOLVITE  Take 1 tablet (1 mg total) by mouth daily.     furosemide 40 MG tablet  Commonly known as:  LASIX  Take 1 tablet (40 mg total) by mouth daily.     HYDROcodone-acetaminophen 10-500 MG per tablet  Commonly known as:  LORTAB  Take 1 tablet by mouth every 6 (six) hours as needed for pain.     ipratropium-albuterol 0.5-2.5 (3) MG/3ML Soln  Commonly known as:  DUONEB  Take 3 mLs by nebulization every 4 (four) hours.     lisinopril 10 MG tablet  Commonly known as:  PRINIVIL,ZESTRIL  Take 1 tablet (10 mg total) by mouth daily.     metoprolol succinate 100 MG 24 hr tablet  Commonly known as:  TOPROL-XL  Take 1 tablet (100 mg total) by mouth daily. Take with or immediately following a meal.     predniSONE 5 MG tablet  Commonly known as:  DELTASONE  Label  & dispense according to the schedule below. 10 Pills PO for 3 days then, 8 Pills PO for 3 days, 6 Pills PO for 3 days, 4 Pills PO for 3 days, 2 Pills PO for 3 days, 1 Pills PO for 3 days, 1/2 Pill  PO for 3 days then STOP. Total 95 pills.     simvastatin 80 MG tablet  Commonly known as:  ZOCOR  Take 80 mg by mouth at bedtime.     spironolactone 25 MG tablet  Commonly known as:  ALDACTONE  Take 1 tablet (25 mg total) by mouth daily.     thiamine 100 MG tablet  Take 1 tablet (100 mg total) by mouth daily.     tiotropium 18 MCG inhalation capsule  Commonly known as:  SPIRIVA HANDIHALER  Place 1 capsule (18 mcg total) into inhaler and inhale daily.     varenicline 0.5 MG tablet  Commonly known as:  CHANTIX  Take 1 tablet (0.5 mg total) by mouth 2 (two) times daily.     varenicline 1 MG tablet  Commonly known as:  CHANTIX CONTINUING MONTH PAK  Take 1 tablet (1 mg total) by mouth 2 (two) times daily.          Diet and Activity recommendation: See Discharge Instructions  above   Consults obtained - Cards   Major procedures and Radiology Reports - PLEASE review detailed and final reports for all details, in brief -    TTE - Mildly dilated LV with EF 25%. Wall motion abnormalities as notedabove. No LV thrombus noted with Definity. Moderate diastolicdysfunction with evidence for elevated LV filling pressure. Normal RV size with mildly decreased systolic function. The IVC was not dilated. Mild pulmonary hypertension.   Lower extremity venous duplex  - No evidence of deep vein or superficial thrombosis involving theright lower extremity and left lower extremity. - . Incidental findings are consistent with: Baker&'s Cyst on theright. - There is another fluid collection in the distal right thigh,adjacent to the right popliteal fossa Baker&'s Cyst. - No evidence of Baker&'s cyst on the left.   Dg Chest 2 View  08/19/2014   CLINICAL DATA:  Shortness of breath since starting new medication on 08/06/2014, COPD,  smoker, coronary disease post MI and CABG, hypertension, ischemic cardiomyopathy  EXAM: CHEST  2 VIEW  COMPARISON:  01/19/2012  FINDINGS: Enlargement of cardiac silhouette post CABG.  Slight vascular congestion.  Bibasilar effusions and atelectasis greater on RIGHT, new.  Minimal perihilar pulmonary edema.  No pneumothorax.  Bones demineralized.  IMPRESSION: Mild CHF with bibasilar effusions and atelectasis greater on RIGHT.   Electronically Signed   By: Ulyses Southward M.D.   On: 08/19/2014 16:53   Ct Angio Chest Pe W/cm &/or Wo Cm  08/19/2014   CLINICAL DATA:  Short of breath for 2 weeks. Lower extremity swelling.  EXAM: CT ANGIOGRAPHY CHEST WITH CONTRAST  TECHNIQUE: Multidetector CT imaging of the chest was performed using the standard protocol during bolus administration of intravenous contrast. Multiplanar CT image reconstructions and MIPs were obtained to evaluate the vascular anatomy.  CONTRAST:  80mL OMNIPAQUE IOHEXOL 350 MG/ML SOLN  COMPARISON:   Chest radiograph 08/19/2014.  Chest CT 01/31/2012.  FINDINGS: Bones: Age indeterminate but new T2 compression fracture with about 20% loss of anterior vertebral body height and no retropulsion. Sclerosis along the superior endplate suggests healing and chronicity. No paravertebral phlegmon. 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  Cardiovascular: Technically adequate study for evaluation of pulmonary embolism. Negative for pulmonary embolism. Aortic atherosclerosis. Median sternotomy/ CABG. Global cardiomegaly. Mild respiratory motion degrades evaluation of the lung bases.  Lungs: Collapse/ consolidation of the lower lobes and RIGHT middle lobe. This is most compatible with compressive atelectasis associated with the pleural effusions. Interlobular septal thickening is present consistent with interstitial pulmonary edema. There is airspace disease at the apices bilaterally, compatible with alveolar edema.  Central airways: Patent.  Effusions: Moderate RIGHT and small LEFT pleural effusion. RIGHT pleural effusion tracks anteriorly along the RIGHT middle lobe with associated compressive atelectasis.  Small amount of pericardial fluid or thickening.  Lymphadenopathy: No axillary adenopathy. Small mediastinal lymph nodes are present which are probably congestive given the other findings.  Esophagus: Grossly normal.  Upper abdomen: Within normal limits.  Other: None.  Review of the MIP images confirms the above findings.  IMPRESSION: 1. Negative for pulmonary embolism. 2. Moderate CHF with RIGHT-greater-than- LEFT bilateral pleural effusions, interstitial and mild apical alveolar pulmonary edema. 3. Cardiomegaly, again consistent with CHF. 4. Age indeterminate but likely chronic T2 superior endplate compression fracture. This is new compared to CT 2013.   Electronically Signed   By: Andreas Newport M.D.   On: 08/19/2014 18:50    Micro Results      Recent Results (from the past 240 hour(s))   MRSA PCR Screening     Status: None   Collection Time: 08/19/14  9:58 PM  Result Value Ref Range Status   MRSA by PCR NEGATIVE NEGATIVE Final    Comment:        The GeneXpert MRSA Assay (FDA approved for NASAL specimens only), is one component of a comprehensive MRSA colonization surveillance program. It is not intended to diagnose MRSA infection nor to guide or monitor treatment for MRSA infections.   Culture, blood (routine x 2) Call MD if unable to obtain prior to antibiotics being given     Status: None (Preliminary result)   Collection Time: 08/19/14 10:20 PM  Result Value Ref Range Status   Specimen Description BLOOD LEFT ARM  Final   Special Requests BOTTLES DRAWN AEROBIC AND ANAEROBIC EACH  Final   Culture   Final  BLOOD CULTURE RECEIVED NO GROWTH TO DATE CULTURE WILL BE HELD FOR 5 DAYS BEFORE ISSUING A FINAL NEGATIVE REPORT Performed at [facility]    Report Status PENDING  Incomplete  Culture, blood (routine x 2) Call MD if unable to obtain prior to antibiotics being given     Status: None (Preliminary result)   Collection Time: 08/19/14 10:22 PM  Result Value Ref Range Status   Specimen Description BLOOD RIGHT HAND  Final   Special Requests BOTTLES DRAWN AEROBIC AND ANAEROBIC 5CC EACH  Final   Culture   Final           BLOOD CULTURE RECEIVED NO GROWTH TO DATE CULTURE WILL BE HELD FOR 5 DAYS BEFORE ISSUING A FINAL NEGATIVE REPORT Performed at [facility]    Report Status PENDING  Incomplete  Respiratory virus panel     Status: None   Collection Time: 08/19/14 10:33 PM  Result Value Ref Range Status   Source - RVPAN NASAL SWAB  Corrected   Respiratory Syncytial Virus A Negative Negative Final   Respiratory Syncytial Virus B Negative Negative Final   Influenza A Negative Negative Final   Influenza B Negative Negative Final   Parainfluenza 1 Negative Negative Final   Parainfluenza 2 Negative Negative Final   Parainfluenza 3 Negative  Negative Final   Metapneumovirus Negative Negative Final   Rhinovirus Negative Negative Final   Adenovirus Negative Negative Final    Comment: (NOTE) Performed At: [facility] [address] [place], [place] [id number] Mila Homer MD YI:[id number]   Urine culture     Status: None   Collection Time: 08/20/14  8:03 AM  Result Value Ref Range Status   Specimen Description URINE, CLEAN CATCH  Final   Special Requests NONE  Final   Colony Count   Final    2,000 COLONIES/ML Performed at [facility]    Culture   Final    INSIGNIFICANT GROWTH Performed at [facility]    Report Status 08/21/2014 FINAL  Final       Today   Subjective:   Cory Dennis today has no headache,no chest abdominal pain,no new weakness tingling or numbness, feels much better wants to go home today or will sign out AMA.  Objective:   Blood pressure 125/72, pulse 98, temperature 97.5 F (36.4 C), temperature source Oral, resp. rate 22, height 5\' 4"  (1.626 m), weight 64.139 kg (141 lb 6.4 oz), SpO2 97 %.   Intake/Output Summary (Last 24 hours) at 08/23/14 0910 Last data filed at 08/23/14 0347  Gross per 24 hour  Intake    720 ml  Output   1725 ml  Net  -1005 ml    Exam Awake Alert, Oriented x 3, No new F.N deficits, Normal affect [place].AT,PERRAL Supple Neck,No JVD, No cervical lymphadenopathy appriciated.  Symmetrical Chest wall movement, Good air movement bilaterally, mild wheezing RRR,No Gallops,Rubs or new Murmurs, No Parasternal Heave +ve B.Sounds, Abd Soft, Non tender, No organomegaly appriciated, No rebound -guarding or rigidity. No Cyanosis, Clubbing or edema, No new Rash or bruise  Data Review   CBC w Diff:  Lab Results  Component Value Date   WBC 8.1 08/19/2014   HGB 14.3 08/19/2014   HCT 42.0 08/19/2014   PLT 183 08/19/2014   LYMPHOPCT 8* 08/19/2014   MONOPCT 10 08/19/2014   EOSPCT 0 08/19/2014   BASOPCT 0 08/19/2014    CMP:  Lab Results    Component Value Date   NA  135 08/22/2014   K 3.7 08/22/2014   CL 93* 08/22/2014   CO2 33* 08/22/2014   BUN 22 08/22/2014   CREATININE 0.94 08/22/2014   CREATININE 0.83 06/24/2014   PROT 6.7 06/24/2014   ALBUMIN 4.0 06/24/2014   BILITOT 0.7 06/24/2014   ALKPHOS 62 06/24/2014   AST 26 06/24/2014   ALT 15 06/24/2014  .   Total Time in preparing paper work, data evaluation and todays exam - 35 minutes  Leroy Sea M.D on 08/23/2014 at 9:10 AM  [facility]   Office  [phone]

## 2014-08-23 NOTE — Discharge Instructions (Signed)
Follow with Primary MD DIXON,MARY [name], PA-C in 7 days   Get CBC, CMP, 2 view Chest X ray checked  by Primary MD next visit.    Activity: As tolerated with Full fall precautions use walker/cane & assistance as needed   Disposition Home     Diet: Heart Healthy    Check your Weight same time everyday, if you gain over 2 pounds, or you develop in leg swelling, experience more shortness of breath or chest pain, call your Primary MD immediately. Follow Cardiac Low Salt Diet and 1.5 lit/day fluid restriction.   On your next visit with your primary care physician please Get Medicines reviewed and adjusted.   Please request your Prim.MD to go over all Hospital Tests and Procedure/Radiological results at the follow up, please get all Hospital records sent to your Prim MD by signing hospital release before you go home.   If you experience worsening of your admission symptoms, develop shortness of breath, life threatening emergency, suicidal or homicidal thoughts you must seek medical attention immediately by calling 911 or calling your MD immediately  if symptoms less severe.  You Must read complete instructions/literature along with all the possible adverse reactions/side effects for all the Medicines you take and that have been prescribed to you. Take any new Medicines after you have completely understood and accpet all the possible adverse reactions/side effects.   Do not drive, operating heavy machinery, perform activities at heights, swimming or participation in water activities or provide baby sitting services if your were admitted for syncope or siezures until you have seen by Primary MD or a Neurologist and advised to do so again.  Do not drive when taking Pain medications.    Do not take more than prescribed Pain, Sleep and Anxiety Medications  Special Instructions: If you have smoked or chewed Tobacco  in the last 2 yrs please stop smoking, stop any regular Alcohol  and or any  Recreational drug use.  Wear Seat belts while driving.   Please note  You were cared for by a hospitalist during your hospital stay. If you have any questions about your discharge medications or the care you received while you were in the hospital after you are discharged, you can call the unit and asked to speak with the hospitalist on call if the hospitalist that took care of you is not available. Once you are discharged, your primary care physician will handle any further medical issues. Please note that NO REFILLS for any discharge medications will be authorized once you are discharged, as it is imperative that you return to your primary care physician (or establish a relationship with a primary care physician if you do not have one) for your aftercare needs so that they can reassess your need for medications and monitor your lab values.

## 2014-08-26 LAB — CULTURE, BLOOD (ROUTINE X 2)
Culture: NO GROWTH
Culture: NO GROWTH

## 2014-09-07 ENCOUNTER — Ambulatory Visit (INDEPENDENT_AMBULATORY_CARE_PROVIDER_SITE_OTHER): Payer: Medicare Other | Admitting: Physician Assistant

## 2014-09-07 ENCOUNTER — Encounter: Payer: Self-pay | Admitting: Physician Assistant

## 2014-09-07 VITALS — BP 114/62 | HR 80 | Temp 97.6°F | Resp 18 | Wt 145.0 lb

## 2014-09-07 DIAGNOSIS — F1010 Alcohol abuse, uncomplicated: Secondary | ICD-10-CM | POA: Diagnosis not present

## 2014-09-07 DIAGNOSIS — I2510 Atherosclerotic heart disease of native coronary artery without angina pectoris: Secondary | ICD-10-CM

## 2014-09-07 DIAGNOSIS — J441 Chronic obstructive pulmonary disease with (acute) exacerbation: Secondary | ICD-10-CM | POA: Diagnosis not present

## 2014-09-07 DIAGNOSIS — Z09 Encounter for follow-up examination after completed treatment for conditions other than malignant neoplasm: Secondary | ICD-10-CM

## 2014-09-07 DIAGNOSIS — I255 Ischemic cardiomyopathy: Secondary | ICD-10-CM

## 2014-09-07 DIAGNOSIS — Z951 Presence of aortocoronary bypass graft: Secondary | ICD-10-CM | POA: Diagnosis not present

## 2014-09-07 DIAGNOSIS — I5043 Acute on chronic combined systolic (congestive) and diastolic (congestive) heart failure: Secondary | ICD-10-CM

## 2014-09-07 DIAGNOSIS — Z720 Tobacco use: Secondary | ICD-10-CM

## 2014-09-07 DIAGNOSIS — D638 Anemia in other chronic diseases classified elsewhere: Secondary | ICD-10-CM | POA: Diagnosis not present

## 2014-09-07 DIAGNOSIS — I2583 Coronary atherosclerosis due to lipid rich plaque: Secondary | ICD-10-CM

## 2014-09-07 NOTE — Progress Notes (Signed)
Patient ID: Cory Dennis MRN: [id number], DOB: 05/11/1946, 69 y.o. Date of Encounter: @DATE @  Chief Complaint:  Chief Complaint  Patient presents with  . one month follow up    HPI: 69 y.o. year old white male  Presents for follow-up after recent hospitalization.  THE FOLLOWING IS COPIED FROM HIS OV WITH ME 08/05/2014:  He presentsas a new patient to establish care.  He goes to the [facility].  for his primary medical care.  He sees Dr. Swaziland for Cardiology.  He says that "[facility] has been [facility]."  Says that he recently saw Dr. Swaziland and told him that the [facility] had done no labs in a long time and had not scheduled him routine follow-up with a primary provider in a long time. He states that Dr. Swaziland told him to get his own private PCP.  He also says that the [place] did his CABG surgery. He has had a lot of problems with his sternum since the surgery. Says that the [facility] was going to [facility] another repeat [facility]. Says that he had a second opinion by Dr. Donata Clay told him not to have repeat surgery. Patient says he really does not trust the [place] doctors.  Patient states that Dr. Swaziland also recently checked labs that showed anemia and Dr. Swaziland told him to get him a private PCP to follow up this with.  Patient says that he hates going to the [place] and does not get good care there. At one point in our conversation, he mentions just following up here and stopping follow-up with the [place].  However at different part of the conversation he says that he does get his medications for free through the [place] and that he was planning to request a transfer to the [place] in [place]-- that he just found out that they had opened a [place] office in [place]  a couple weeks ago.  He says that he had no other specific concerns to be addressed today.  ASSESSMENT AND PLAN:  69 y.o. year old male with  1. Anemia, unspecified anemia type Reviewed recent labs performed by Dr. Swaziland which  are found in epic. Patient had some mild anemia with hemoglobin 12.8 hematocrit 37.2. Was normocytic, normochromic. We'll check full anemia panel. But the fact that it's normochromic normocytic is consistent with anemia of chronic disease. Then reviewed that he is a smoker. He says that he currently is smoking about one third pack per day. Decreased to this amount since January. That in January he was smoking 1 pack per day. 50+ years of smoking--has smoked since a teen. - Anemia panel  2. Tobacco abuse See #1 above.  He says that he has decreased his smoking significantly since January and is motivated to try to quit. Asked him if he had ever used any medications to help him quit and he says no and he is agreeable to try Chantix. Told him if he has any adverse effects to stop the medication and call us immediately. Otherwise if he has no side effects he will take the starting dose pack and then increase to the continuing Dosepak. - varenicline (CHANTIX) 0.5 MG tablet; Take 1 tablet (0.5 mg total) by mouth 2 (two) times daily.  Dispense: 60 tablet; Refill: 0 - varenicline (CHANTIX CONTINUING MONTH PAK) 1 MG tablet; Take 1 tablet (1 mg total) by mouth 2 (two) times daily.  Dispense: 60 tablet; Refill: 2  3. Ischemic cardiomyopathy  4. Coronary artery disease involving native coronary artery of native heart without angina pectoris  5. S/P CABG (coronary artery bypass graft)  6. Simple chronic bronchitis  7. Hyponatremia   Today I had him sign a release to get records from [facility]-----specifically for immunization records and health maintenance records including colonoscopy report. He is to schedule follow-up office visit in one month.  . Also we will be contacting him when we get the results of the anemia panel .  HE HAD ONE OTHER OV WITH ME---08/18/2104--Presented with SOB and Left LE edema---I sent him to hospital.   Today I reviewed the discharge summary. He was hospitalized  08/19/14 through 08/23/14. Hospitalized with CHF exacerbation and COPD exacerbation. BNP 3689. Sodium 119. CXR showed CHF. CT angiogram negative for pulmonary embolism but did show CHF changes (right greater than left)  Was treated with IV Lasix as well as IV Solu-Medrol which was tapered and also azithromycin. Treated with nebulizer treatments and oxygen. Was discharged with oral Lasix beta blocker and ACE inhibitor. Azithromycin for 5 more days. As well he would be set up for home oxygen nebulizer machine and nebulizer medication along with Spiriiva. They planned for low sodium diet and fluid restriction and he is to be checking weights daily. Follow-up with cardiology and pulmonary as an outpatient.  EF 25%. Moderate Diastolic Dysfunction  Regarding his history of alcohol use he was treated with folic acid and thiamine at time of discharge.  He had hyponatremia which was due to fluid overload and had resolved with Lasix. He did have renal consultation during the hospitalization.  They also noted right popliteal Baker's cyst. They had discussed it with her orthopedic surgeon on call Dr.Xu, who recommended outpatient follow-up.  TODAY---09/07/2014:  Pt states that he does not have a set of scales at home therefore he has not been weighing himself. Patient states that he did not even realize that there were medications at the pharmacy for him to pick up.  Says he did not even know until he got a phone call Sunday and he picked up the medications then.  He says that Lasix was one of these medicines and that he had not been taking any Lasix until his first dose just yesterday after getting call from the pharmacy yesterday.  He did take a dose yesterday and today.  He has an appointment with Nada Boozer nurse practitioner cardiology 09/23/14. He does not have any appointment scheduled with pulmonary or orthopedics. I discussed him needing these referrals. He states that he does not want to follow-up  with pulmonary. Defers this referral Says "he knows what's wrong with his lungs----agent orange".  Also, has had some eval of this through the [place].   He also states that he is having no pain in the area of the back of his knee and is having no issues with this and does not want to follow-up with orthopedics either.  Today he says that "the left leg swelling had gotten a lot better since they put him on that fluid pill at the hospital." Says his "breathing is good"--says he is not feeling shortness of breath or wheezing and feels that this is at baseline. Says "I feel better than I have in a while".  No complaints or concerns today.   Past Medical History  Diagnosis Date  . Myocardial infarction   . Musculoskeletal chest pain   . Fatigue   . SOB (shortness of breath)   . Arthritis   .  Anxiety   . Depression   . Syncope   . Ischemic cardiomyopathy   . COPD (chronic obstructive pulmonary disease)   . Hypertension   . Tobacco abuse   . Alcohol abuse   . CAD (coronary artery disease)   . Anemia   . Emphysema of lung   . CHF (congestive heart failure)      Home Meds: Outpatient Prescriptions Prior to Visit  Medication Sig Dispense Refill  . albuterol (PROVENTIL HFA;VENTOLIN HFA) 108 (90 BASE) MCG/ACT inhaler Inhale 2 puffs into the lungs every 6 (six) hours as needed for shortness of breath.     Marland Kitchen aspirin 81 MG tablet Take 81 mg by mouth daily.    . budesonide-formoterol (SYMBICORT) 160-4.5 MCG/ACT inhaler Inhale 2 puffs into the lungs 2 (two) times daily.    . folic acid (FOLVITE) 1 MG tablet Take 1 tablet (1 mg total) by mouth daily. 30 tablet 6  . furosemide (LASIX) 40 MG tablet Take 1 tablet (40 mg total) by mouth daily. 30 tablet 0  . HYDROcodone-acetaminophen (LORTAB) 10-500 MG per tablet Take 1 tablet by mouth every 6 (six) hours as needed for pain.    Marland Kitchen ipratropium-albuterol (DUONEB) 0.5-2.5 (3) MG/3ML SOLN Take 3 mLs by nebulization every 4 (four) hours. 360 mL 1  .  lisinopril (PRINIVIL,ZESTRIL) 10 MG tablet Take 1 tablet (10 mg total) by mouth daily. 30 tablet 0  . metoprolol succinate (TOPROL-XL) 100 MG 24 hr tablet Take 1 tablet (100 mg total) by mouth daily. Take with or immediately following a meal. 30 tablet 0  . thiamine 100 MG tablet Take 1 tablet (100 mg total) by mouth daily. 30 tablet 0  . tiotropium (SPIRIVA HANDIHALER) 18 MCG inhalation capsule Place 1 capsule (18 mcg total) into inhaler and inhale daily. 30 capsule 12  . simvastatin (ZOCOR) 80 MG tablet Take 80 mg by mouth at bedtime.    Marland Kitchen spironolactone (ALDACTONE) 25 MG tablet Take 1 tablet (25 mg total) by mouth daily. (Patient not taking: Reported on 08/05/2014)    . varenicline (CHANTIX CONTINUING MONTH PAK) 1 MG tablet Take 1 tablet (1 mg total) by mouth 2 (two) times daily. (Patient not taking: Reported on 08/19/2014) 60 tablet 2  . varenicline (CHANTIX) 0.5 MG tablet Take 1 tablet (0.5 mg total) by mouth 2 (two) times daily. (Patient not taking: Reported on 08/19/2014) 60 tablet 0  . azithromycin (ZITHROMAX) 500 MG tablet Take 1 tablet (500 mg total) by mouth daily. 5 tablet 0  . predniSONE (DELTASONE) 5 MG tablet Label  & dispense according to the schedule below. 10 Pills PO for 3 days then, 8 Pills PO for 3 days, 6 Pills PO for 3 days, 4 Pills PO for 3 days, 2 Pills PO for 3 days, 1 Pills PO for 3 days, 1/2 Pill  PO for 3 days then STOP. Total 95 pills. 95 tablet 0   No facility-administered medications prior to visit.    Allergies:  Allergies  Allergen Reactions  . Penicillins Other (See Comments)    Doesn't remember    History   Social History  . Marital Status: Single    Spouse Name: N/A  . Number of Children: N/A  . Years of Education: N/A   Occupational History  . Mechanic    Social History Main Topics  . Smoking status: Former Smoker -- 1.00 packs/day for 42 years    Types: Cigarettes    Quit date: 08/07/2014  . Smokeless tobacco: Never Used  Comment: working on  it  . Alcohol Use: 12.0 - 14.4 oz/week    20-24 Cans of beer per week     Comment: 5-6 beers per day  . Drug Use: No  . Sexual Activity: Not Currently   Other Topics Concern  . Not on file   Social History Narrative    Family History  Problem Relation Age of Onset  . Family history unknown: Yes     Review of Systems:  See HPI for pertinent ROS. All other ROS negative.    Physical Exam: Blood pressure 114/62, pulse 80, temperature 97.6 F (36.4 C), temperature source Oral, resp. rate 18, weight 145 lb (65.772 kg)., Body mass index is 24.88 kg/(m^2). General: WNWD WM. Appears in no acute distress. Neck: Supple. No thyromegaly. No lymphadenopathy. No bruits. Lungs: Distant, Decreased BS throughout, but clear. No wheezes, rhonchi, or rales.  Heart: RRR with S1 S2. No murmurs, rubs, or gallops. Abdomen: Soft, non-tender, non-distended with normoactive bowel sounds. No hepatomegaly. No rebound/guarding. No obvious abdominal masses. Musculoskeletal:  Strength and tone normal for age. Extremities/Skin:  3+ pitting edema of both lower legs, 2/3 way up but left is worse than right.   Neuro: Alert and oriented X 3. Moves all extremities spontaneously. Gait is normal. CNII-XII grossly in tact. Psych:  Responds to questions appropriately with a normal affect.     ASSESSMENT AND PLAN:  69 y.o. year old male with   1. Hospital discharge follow-up  2. Acute on chronic combined systolic and diastolic congestive heart failure - CBC with Differential/Platelet - COMPLETE METABOLIC PANEL WITH GFR - DG Chest 2 View; Future  3. S/P CABG (coronary artery bypass graft)  4. Coronary artery disease due to lipid rich plaque  5. COPD exacerbation - CBC with Differential/Platelet - COMPLETE METABOLIC PANEL WITH GFR - DG Chest 2 View; Future  6. Tobacco abuse - CBC with Differential/Platelet - DG Chest 2 View; Future  7. Alcohol abuse - CBC with Differential/Platelet - COMPLETE  METABOLIC PANEL WITH GFR  8. Anemia of chronic disease - CBC with Differential/Platelet  Hospital discharge summary specifically outlined for me to check CBC CMET and chest x-ray today. Will follow-up  These now. Reviewed with patient repeatedly to make sure that he takes his Lasix daily and he is agreeable to do so now that he realizes that he was supposed to be taking this and needed to pick it up at the pharmacy. Since he just got back on this yesterday, will have him continue this daily and have him have a follow-up visit with me here in one week to reassess and make sure that the lower extremity edema is resolving.  He has follow-up appointment with Nada Boozer nurse practitioner at cardiology 09/23/14.  Murray Hodgkins [place], [place], [facility] 09/07/2014 5:09 PM

## 2014-09-08 ENCOUNTER — Ambulatory Visit
Admission: RE | Admit: 2014-09-08 | Discharge: 2014-09-08 | Disposition: A | Discharge status: Home / Self Care | Payer: Medicare Other | Source: Ambulatory Visit | Attending: Physician Assistant | Admitting: Physician Assistant

## 2014-09-08 DIAGNOSIS — F17290 Nicotine dependence, other tobacco product, uncomplicated: Secondary | ICD-10-CM | POA: Diagnosis not present

## 2014-09-08 DIAGNOSIS — J449 Chronic obstructive pulmonary disease, unspecified: Secondary | ICD-10-CM | POA: Diagnosis not present

## 2014-09-08 DIAGNOSIS — I5043 Acute on chronic combined systolic (congestive) and diastolic (congestive) heart failure: Secondary | ICD-10-CM

## 2014-09-08 DIAGNOSIS — Z720 Tobacco use: Secondary | ICD-10-CM

## 2014-09-08 DIAGNOSIS — J90 Pleural effusion, not elsewhere classified: Secondary | ICD-10-CM | POA: Diagnosis not present

## 2014-09-08 DIAGNOSIS — J441 Chronic obstructive pulmonary disease with (acute) exacerbation: Secondary | ICD-10-CM

## 2014-09-08 LAB — CBC WITH DIFFERENTIAL/PLATELET
BASOS PCT: 0 % (ref 0–1)
Basophils Absolute: 0 10*3/uL (ref 0.0–0.1)
Eosinophils Absolute: 0 10*3/uL (ref 0.0–0.7)
Eosinophils Relative: 0 % (ref 0–5)
HCT: 40 % (ref 39.0–52.0)
Hemoglobin: 13.6 g/dL (ref 13.0–17.0)
LYMPHS PCT: 23 % (ref 12–46)
Lymphs Abs: 1.9 10*3/uL (ref 0.7–4.0)
MCH: 31.3 pg (ref 26.0–34.0)
MCHC: 34 g/dL (ref 30.0–36.0)
MCV: 92.2 fL (ref 78.0–100.0)
MONOS PCT: 10 % (ref 3–12)
MPV: 9.2 fL (ref 8.6–12.4)
Monocytes Absolute: 0.8 10*3/uL (ref 0.1–1.0)
NEUTROS ABS: 5.5 10*3/uL (ref 1.7–7.7)
Neutrophils Relative %: 67 % (ref 43–77)
Platelets: 239 10*3/uL (ref 150–400)
RBC: 4.34 MIL/uL (ref 4.22–5.81)
RDW: 13.6 % (ref 11.5–15.5)
WBC: 8.2 10*3/uL (ref 4.0–10.5)

## 2014-09-08 LAB — COMPLETE METABOLIC PANEL WITH GFR
ALK PHOS: 75 U/L (ref 39–117)
ALT: 14 U/L (ref 0–53)
AST: 21 U/L (ref 0–37)
Albumin: 3.2 g/dL — ABNORMAL LOW (ref 3.5–5.2)
BILIRUBIN TOTAL: 0.7 mg/dL (ref 0.2–1.2)
BUN: 20 mg/dL (ref 6–23)
CO2: 28 mEq/L (ref 19–32)
Calcium: 8.6 mg/dL (ref 8.4–10.5)
Chloride: 94 mEq/L — ABNORMAL LOW (ref 96–112)
Creat: 0.98 mg/dL (ref 0.50–1.35)
GFR, Est African American: >89 mL/min
GFR, Est Non African American: 78 mL/min
Glucose, Bld: 96 mg/dL (ref 70–99)
Potassium: 4.3 mEq/L (ref 3.5–5.3)
SODIUM: 133 meq/L — AB (ref 135–145)
Total Protein: 6.3 g/dL (ref 6.0–8.3)

## 2014-09-16 ENCOUNTER — Encounter: Payer: Self-pay | Admitting: Physician Assistant

## 2014-09-16 ENCOUNTER — Ambulatory Visit (INDEPENDENT_AMBULATORY_CARE_PROVIDER_SITE_OTHER): Payer: Medicare Other | Admitting: Physician Assistant

## 2014-09-16 VITALS — BP 110/66 | HR 76 | Temp 97.8°F | Resp 20 | Wt 148.0 lb

## 2014-09-16 DIAGNOSIS — I255 Ischemic cardiomyopathy: Secondary | ICD-10-CM | POA: Diagnosis not present

## 2014-09-16 DIAGNOSIS — I509 Heart failure, unspecified: Secondary | ICD-10-CM | POA: Diagnosis not present

## 2014-09-16 DIAGNOSIS — I2510 Atherosclerotic heart disease of native coronary artery without angina pectoris: Secondary | ICD-10-CM

## 2014-09-16 DIAGNOSIS — I5043 Acute on chronic combined systolic (congestive) and diastolic (congestive) heart failure: Secondary | ICD-10-CM | POA: Diagnosis not present

## 2014-09-16 DIAGNOSIS — I2583 Coronary atherosclerosis due to lipid rich plaque: Secondary | ICD-10-CM

## 2014-09-16 DIAGNOSIS — J449 Chronic obstructive pulmonary disease, unspecified: Secondary | ICD-10-CM | POA: Insufficient documentation

## 2014-09-16 DIAGNOSIS — J439 Emphysema, unspecified: Secondary | ICD-10-CM | POA: Diagnosis not present

## 2014-09-16 DIAGNOSIS — Z951 Presence of aortocoronary bypass graft: Secondary | ICD-10-CM

## 2014-09-16 NOTE — Progress Notes (Signed)
Patient ID: Cory Dennis MRN: [id number], DOB: 1945/06/09, 69 y.o. Date of Encounter: @DATE @  Chief Complaint:  Chief Complaint  Patient presents with  . 70 week follow up    using claritin for allergies    HPI: 69 y.o. year old white male  Presents for follow-up after recent hospitalization.  THE FOLLOWING IS COPIED FROM HIS OV WITH ME 08/05/2014:  He presents as a new patient to establish care.  He goes to the [facility].  for his primary medical care.  He sees Dr. Swaziland for Cardiology.  He says that "The [facility] has been [facility] the ball on me lately."  Says that he recently saw Dr. Swaziland and told him that the VA had done no labs in a long time and had not scheduled him routine follow-up with a primary provider in a long time. He states that Dr. Swaziland told him to get his own private PCP.  He also says that the [place] did his CABG surgery. He has had a lot of problems with his sternum since the surgery. Says that the [facility] was going to do another repeat surgery. Says that he had a second opinion by Dr. Donata Clay told him not to have repeat surgery. Patient says he really does not trust the [place] doctors.  Patient states that Dr. Swaziland also recently checked labs that showed anemia and Dr. Swaziland told him to get him a private PCP to follow up this with.  Patient says that he hates going to the [place] and does not get good care there. At one point in our conversation, he mentions just following up here and stopping follow-up with the [place].  However at different part of the conversation he says that he does get his medications for free through the [place] and that he was planning to request a transfer to the [place] in [place]-- that he just found out that they had opened a [place] office in [place]  a couple weeks ago.  He says that he had no other specific concerns to be addressed today.  At that OV I did the following:   Checked Anemia Panel-- noted that prior CBC was normochromic normocytic  anemia. Consistent with anemia of chronic disease. Reviewed that he is a smoker. He reported that he was currently smoking about one third pack per day. Decreased to this amount since January. In January he was smoking 1 pack per day. 50+ years of smoking--has smoked since a teen.  Prescribed Chantix for smoking cessation.    For his 2nd OV with me, he presented on -08/18/2104--Presented with SOB and Left LE edema---I sent him to hospital.   He subsequently had follow-up office visit with me 09/07/2014. At that visit I reviewed his hospital discharge summary.   He was hospitalized 08/19/14 through 08/23/14. Hospitalized with CHF exacerbation and COPD exacerbation.  BNP 3689. Sodium 119. CXR showed CHF. CT angiogram negative for pulmonary embolism but did show CHF changes (right greater than left)  Was treated with IV Lasix as well as IV Solu-Medrol which was tapered and also azithromycin. Treated with nebulizer treatments and oxygen. Was discharged with oral Lasix beta blocker and ACE inhibitor. Azithromycin for 5 more days. As well he would be set up for home oxygen nebulizer machine and nebulizer medication along with Spiriiva. They planned for low sodium diet and fluid restriction and he is to be checking weights daily. Follow-up with cardiology and pulmonary as an outpatient.  EF 25%. Moderate Diastolic  Dysfunction  Regarding his history of alcohol use he was treated with folic acid and thiamine at time of discharge.  He had hyponatremia which was due to fluid overload and had resolved with Lasix. He did have renal consultation during the hospitalization.  They also noted right popliteal Baker's cyst. They had discussed it with her orthopedic surgeon on call Dr.Xu, who recommended outpatient follow-up.  At OV---09/07/2014:  Pt states that he does not have a set of scales at home therefore he has not been weighing himself. Patient states that he did not even realize that there were  medications at the pharmacy for him to pick up.  York Spaniel he did not even know until he got a phone call Sunday and he picked up the medications then.  He said that Lasix was one of these medicines and that he had not been taking any Lasix until his first dose just yesterday after getting call from the pharmacy yesterday.  He did take a dose yesterday and today.  He has an appointment with Nada Boozer nurse practitioner cardiology 09/23/14. He does not have any appointment scheduled with pulmonary or orthopedics. I discussed him needing these referrals. He states that he does not want to follow-up with pulmonary. Defers this referral Says "he knows what's wrong with his lungs----agent orange".  Also, has had some eval of this through the [place].   He also states that he is having no pain in the area of the back of his knee and is having no issues with this and does not want to follow-up with orthopedics either.  At OV 09/07/14-- he said that "the left leg swelling had gotten a lot better since they put him on that fluid pill at the hospital." Says his "breathing is good"--says he is not feeling shortness of breath or wheezing and feels that this is at baseline. Says "I feel better than I have in a while".  AT OV 09/16/2014: States that he has continued to take the Lasix 40 mg daily. States that he has not gotten a chance to get himself a set of scales. States that he sleeps on one pillow at night with no orthopnea. Says he has a hospital bed but is completely flat and head of the bed is not raised. Also discussed that lab results checked at last visit showed low protein. Says he did get the message to increase protein in his diet and to add Ensure. But, says "I havent gotten to the store yet" Asked what he usually eats. Says sometimes he'll have breakfast and if he does he'll have some eggs. Eats this and he won't eat another meal until later around dinner. Usually will have a meat and a vegetable. Asked if  he feels that his shortness of breath has further improved and he really doesn't give me much answer. Says that he isn't having any shortness of breath/difficulty breathing.   Past Medical History  Diagnosis Date  . Myocardial infarction   . Musculoskeletal chest pain   . Fatigue   . SOB (shortness of breath)   . Arthritis   . Anxiety   . Depression   . Syncope   . Ischemic cardiomyopathy   . COPD (chronic obstructive pulmonary disease)   . Hypertension   . Tobacco abuse   . Alcohol abuse   . CAD (coronary artery disease)   . Anemia   . Emphysema of lung   . CHF (congestive heart failure)      Home Meds:  Outpatient Prescriptions Prior to Visit  Medication Sig Dispense Refill  . albuterol (PROVENTIL HFA;VENTOLIN HFA) 108 (90 BASE) MCG/ACT inhaler Inhale 2 puffs into the lungs every 6 (six) hours as needed for shortness of breath.     Marland Kitchen aspirin 81 MG tablet Take 81 mg by mouth daily.    . budesonide-formoterol (SYMBICORT) 160-4.5 MCG/ACT inhaler Inhale 2 puffs into the lungs 2 (two) times daily.    . folic acid (FOLVITE) 1 MG tablet Take 1 tablet (1 mg total) by mouth daily. 30 tablet 6  . furosemide (LASIX) 40 MG tablet Take 1 tablet (40 mg total) by mouth daily. 30 tablet 0  . HYDROcodone-acetaminophen (LORTAB) 10-500 MG per tablet Take 1 tablet by mouth every 6 (six) hours as needed for pain.    Marland Kitchen ipratropium-albuterol (DUONEB) 0.5-2.5 (3) MG/3ML SOLN Take 3 mLs by nebulization every 4 (four) hours. 360 mL 1  . lisinopril (PRINIVIL,ZESTRIL) 10 MG tablet Take 1 tablet (10 mg total) by mouth daily. 30 tablet 0  . metoprolol succinate (TOPROL-XL) 100 MG 24 hr tablet Take 1 tablet (100 mg total) by mouth daily. Take with or immediately following a meal. 30 tablet 0  . simvastatin (ZOCOR) 80 MG tablet Take 80 mg by mouth at bedtime.    Marland Kitchen spironolactone (ALDACTONE) 25 MG tablet Take 1 tablet (25 mg total) by mouth daily.    Marland Kitchen thiamine 100 MG tablet Take 1 tablet (100 mg total) by  mouth daily. 30 tablet 0  . tiotropium (SPIRIVA HANDIHALER) 18 MCG inhalation capsule Place 1 capsule (18 mcg total) into inhaler and inhale daily. 30 capsule 12  . varenicline (CHANTIX CONTINUING MONTH PAK) 1 MG tablet Take 1 tablet (1 mg total) by mouth 2 (two) times daily. (Patient not taking: Reported on 09/16/2014) 60 tablet 2  . varenicline (CHANTIX) 0.5 MG tablet Take 1 tablet (0.5 mg total) by mouth 2 (two) times daily. (Patient not taking: Reported on 09/16/2014) 60 tablet 0   No facility-administered medications prior to visit.    Allergies:  Allergies  Allergen Reactions  . Penicillins Other (See Comments)    Doesn't remember    History   Social History  . Marital Status: Single    Spouse Name: N/A  . Number of Children: N/A  . Years of Education: N/A   Occupational History  . Mechanic    Social History Main Topics  . Smoking status: Current Every Day Smoker -- 0.25 packs/day for 42 years    Types: Cigarettes  . Smokeless tobacco: Never Used     Comment: working on it  . Alcohol Use: 12.0 - 14.4 oz/week    20-24 Cans of beer per week     Comment: 5-6 beers per day  . Drug Use: No  . Sexual Activity: Not Currently   Other Topics Concern  . Not on file   Social History Narrative    Family History  Problem Relation Age of Onset  . Family history unknown: Yes     Review of Systems:  See HPI for pertinent ROS. All other ROS negative.    Physical Exam: Blood pressure 110/66, pulse 76, temperature 97.8 F (36.6 C), temperature source Oral, resp. rate 20, weight 148 lb (67.132 kg), SpO2 95 %., Body mass index is 25.39 kg/(m^2). General: WNWD WM. Appears in no acute distress. Neck: Supple. No thyromegaly. No lymphadenopathy. No bruits. Lungs: Distant, Decreased BS throughout, but clear. No wheezes, rhonchi, or rales.  Heart: RRR with S1 S2. No  murmurs, rubs, or gallops. Abdomen: Soft, non-tender, non-distended with normoactive bowel sounds. No hepatomegaly.  No rebound/guarding. No obvious abdominal masses. Musculoskeletal:  Strength and tone normal for age. Extremities/Skin:  2+ - 3+ pitting edema of left lower legs, 2/3 way up the left . No LE edema Right.   Neuro: Alert and oriented X 3. Moves all extremities spontaneously. Gait is normal. CNII-XII grossly in tact. Psych:  Responds to questions appropriately with a normal affect.     ASSESSMENT AND PLAN:  69 y.o. year old male with   1. Hospital discharge follow-up  2. Acute on chronic combined systolic and diastolic congestive heart failure -BMET 3. S/P CABG (coronary artery bypass graft)  4. Coronary artery disease due to lipid rich plaque  5. COPD exacerbation 6. Tobacco abuse 7. Alcohol abuse  8. Anemia of chronic disease  At visit 09/07/14 I did obtain follow-up chest x-ray and labs. Chest x-ray did show some improvement in the effusions and CHF. However at that time he had been he had missed several days of Lasix.  Since then he has been taking the Lasix daily. I told him to take 2 pills of Lasix for 3 days (2 of the 40 mg to equal 80 mg). After these 3 days, go back to taking just 1 pill per day. He is also on Spironolactone. Check another BMET today to monitor.  Considered obtaining another follow-up chest x-ray but will hold off on this at this point. He does have follow-up appointment with Nada Boozer and he is aware and plans to follow up with that appointment.  He has follow-up appointment with Nada Boozer nurse practitioner at cardiology 09/23/14.  Murray Hodgkins [place], [place], [facility] 09/16/2014 2:51 PM

## 2014-09-17 LAB — BASIC METABOLIC PANEL WITH GFR
BUN: 16 mg/dL (ref 6–23)
CO2: 29 mEq/L (ref 19–32)
Calcium: 8.7 mg/dL (ref 8.4–10.5)
Chloride: 92 mEq/L — ABNORMAL LOW (ref 96–112)
Creat: 0.9 mg/dL (ref 0.50–1.35)
GFR, EST NON AFRICAN AMERICAN: 87 mL/min
Glucose, Bld: 104 mg/dL — ABNORMAL HIGH (ref 70–99)
Potassium: 5 mEq/L (ref 3.5–5.3)
Sodium: 129 mEq/L — ABNORMAL LOW (ref 135–145)

## 2014-09-23 ENCOUNTER — Ambulatory Visit (INDEPENDENT_AMBULATORY_CARE_PROVIDER_SITE_OTHER): Payer: Medicare Other | Admitting: Cardiology

## 2014-09-23 ENCOUNTER — Encounter: Payer: Self-pay | Admitting: Cardiology

## 2014-09-23 VITALS — BP 122/70 | HR 80 | Ht 64.0 in | Wt 146.8 lb

## 2014-09-23 DIAGNOSIS — Z720 Tobacco use: Secondary | ICD-10-CM | POA: Diagnosis not present

## 2014-09-23 DIAGNOSIS — R0602 Shortness of breath: Secondary | ICD-10-CM | POA: Diagnosis not present

## 2014-09-23 DIAGNOSIS — I2510 Atherosclerotic heart disease of native coronary artery without angina pectoris: Secondary | ICD-10-CM | POA: Diagnosis not present

## 2014-09-23 DIAGNOSIS — I255 Ischemic cardiomyopathy: Secondary | ICD-10-CM | POA: Diagnosis not present

## 2014-09-23 DIAGNOSIS — R0989 Other specified symptoms and signs involving the circulatory and respiratory systems: Secondary | ICD-10-CM

## 2014-09-23 NOTE — Progress Notes (Signed)
Cardiology Office Note   Date:  09/23/2014   ID:  FANUEL DORNBUSH, DOB 01/28/1946, MRN [id number]  PCP:  Frazier Richards, PA-C  Cardiologist:  Dr. Swaziland    Chief Complaint  Patient presents with  . Leg Swelling    left leg/ pt declines chest pain, dizziness  . Shortness of Breath    on exertion      History of Present Illness: Cory Dennis is a 69 y.o. male who presents for SOB and edema post hospital.  He has a history of CABG in February 2011 at the [facility]. He had a left ventricular aneurysm repair. Ejection fraction was 30%. Since his heart surgery he has had nonunion of the left parasternal region. He was evaluated here by Dr. Donata Clay who did not recommend surgery for his sternum.   Last echo in March of this year: Left ventricle: The cavity size was mildly dilated. Wall thickness was normal. The estimated ejection fraction was 25%. The apical septal wall and true apex were akinetic. The inferolateral wall was akinetic. The basal and apical inferior wall segments were akinetic. The mid to apical anterolateral wall was akinetic. Definity contrast was used, no LV thrombus visualized. Features are consistent with a pseudonormal left ventricular filling pattern, with concomitant abnormal relaxation and increased filling pressure (grade 2 diastolic dysfunction). E/medial e&' > 15 suggests LV end diastolic pressure at least 20 mmHg. - Aortic valve: There was no stenosis. - Mitral valve: Mildly calcified annulus. Mildly calcified leaflets . There was mild regurgitation. - Left atrium: The atrium was moderately dilated. - Right ventricle: Poorly visualized. The cavity size was normal. Systolic function was mildly reduced. - Tricuspid valve: Peak RV-RA gradient (S): 41 mm Hg. - Pulmonary arteries: PA peak pressure: 44 mm Hg (S). - Inferior vena cava: The vessel was normal in size. The respirophasic diameter changes were in the normal  range (>= 50%), consistent with normal central venous pressure.  Impressions:  - Mildly dilated LV with EF 25%. Wall motion abnormalities as noted above. No LV thrombus noted with Definity. Moderate diastolic dysfunction with evidence for elevated LV filling pressure. Normal RV size with mildly decreased systolic function. The IVC was not dilated. Mild pulmonary hypertension.  Recent CTA of chest was negative for PE, bil. Pl effusions and pul edema.   Refused ICD in the past.  Today he tells me he is feeling better, his weight is up from discharge weight but he is wearing cowboy boots.  His appetite is good.  He has SOB but no chest pain.  His SOB is his chronic, with wheezes.  Overall he feels better.  Pt continues to smoke 1/2 ppd.  Not sure if he will quit.  Has not taken the Chantix since discharge.     Past Medical History  Diagnosis Date  . Myocardial infarction   . Musculoskeletal chest pain   . Fatigue   . SOB (shortness of breath)   . Arthritis   . Anxiety   . Depression   . Syncope   . Ischemic cardiomyopathy   . COPD (chronic obstructive pulmonary disease)   . Hypertension   . Tobacco abuse   . Alcohol abuse   . CAD (coronary artery disease)   . Anemia   . Emphysema of lung   . CHF (congestive heart failure)     Past Surgical History  Procedure Laterality Date  . Coronary artery bypass graft  2011    4v CABG [facility]  in [place] in 06/2009 with LIMA to LAD, RIMA to distal RCA, sequential SVG to OM1/OM 2  . Left ventricular aneurysm repair       Current Outpatient Prescriptions  Medication Sig Dispense Refill  . albuterol (PROVENTIL HFA;VENTOLIN HFA) 108 (90 BASE) MCG/ACT inhaler Inhale 2 puffs into the lungs every 6 (six) hours as needed for shortness of breath.     Marland Kitchen aspirin 81 MG tablet Take 81 mg by mouth daily.    . budesonide-formoterol (SYMBICORT) 160-4.5 MCG/ACT inhaler Inhale 2 puffs into the lungs 2 (two) times daily.    . folic acid  (FOLVITE) 1 MG tablet Take 1 tablet (1 mg total) by mouth daily. 30 tablet 6  . furosemide (LASIX) 40 MG tablet Take 1 tablet (40 mg total) by mouth daily. 30 tablet 0  . HYDROcodone-acetaminophen (LORTAB) 10-500 MG per tablet Take 1 tablet by mouth every 6 (six) hours as needed for pain.    Marland Kitchen ipratropium-albuterol (DUONEB) 0.5-2.5 (3) MG/3ML SOLN Take 3 mLs by nebulization every 4 (four) hours. 360 mL 1  . lisinopril (PRINIVIL,ZESTRIL) 10 MG tablet Take 1 tablet (10 mg total) by mouth daily. 30 tablet 0  . metoprolol succinate (TOPROL-XL) 100 MG 24 hr tablet Take 1 tablet (100 mg total) by mouth daily. Take with or immediately following a meal. 30 tablet 0  . simvastatin (ZOCOR) 80 MG tablet Take 80 mg by mouth at bedtime.    Marland Kitchen spironolactone (ALDACTONE) 25 MG tablet Take 1 tablet (25 mg total) by mouth daily.    Marland Kitchen thiamine 100 MG tablet Take 1 tablet (100 mg total) by mouth daily. 30 tablet 0  . tiotropium (SPIRIVA HANDIHALER) 18 MCG inhalation capsule Place 1 capsule (18 mcg total) into inhaler and inhale daily. 30 capsule 12  . varenicline (CHANTIX CONTINUING MONTH PAK) 1 MG tablet Take 1 tablet (1 mg total) by mouth 2 (two) times daily. 60 tablet 2  . varenicline (CHANTIX) 0.5 MG tablet Take 1 tablet (0.5 mg total) by mouth 2 (two) times daily. 60 tablet 0   No current facility-administered medications for this visit.    Allergies:   Penicillins    Social History:  The patient  reports that he has been smoking Cigarettes.  He has a 10.5 pack-year smoking history. He has never used smokeless tobacco. He reports that he drinks about 12.0 - 14.4 oz of alcohol per week. He reports that he does not use illicit drugs.   Family History:  The patient's Family history is unknown by patient.    ROS:  General:no colds or fevers, no weight changes though up from the hospital. Skin:no rashes or ulcers HEENT:no blurred vision, no congestion CV:see HPI PUL:see HPI is using his inhalers.  GI:no  diarrhea constipation or melena, no indigestion GU:no hematuria, no dysuria MS:no joint pain, no claudication Neuro:no syncope, no lightheadedness Endo:no diabetes, no thyroid disease  Wt Readings from Last 3 Encounters:  09/23/14 146 lb 12.8 oz (66.588 kg)  09/16/14 148 lb (67.132 kg)  09/07/14 145 lb (65.772 kg)     PHYSICAL EXAM: VS:  BP 122/70 mmHg  Pulse 80  Ht  (1.626 m)  Wt 146 lb 12.8 oz (66.588 kg)  BMI 25.19 kg/m2 , BMI Body mass index is 25.19 kg/(m^2). General:Pleasant affect, NAD, chronically ill appearing  Skin:Warm and dry, brisk capillary refill HEENT:normocephalic, sclera clear, mucus membranes moist Neck:supple, no JVD, no bruits  Heart:S1S2 RRR without murmur, gallup, rub or click, can feel sternum moving  with respirations Lungs: with rales,and incresed rhonchi on the right, occwheezes ZOX:WRUE, non tender, + BS, do not palpate liver spleen or masses Ext:tr lower ext edema,  2+ radial pulses Neuro:alert and oriented X 3, MAE, follows commands, + facial symmetry    EKG:  EKG is NOT ordered today.    Recent Labs: 08/19/2014: B Natriuretic Peptide 3689.7*; TSH 1.147 09/07/2014: ALT 14; Hemoglobin 13.6; Platelets 239 09/16/2014: BUN 16; Creatinine 0.90; Potassium 5.0; Sodium 129*    Lipid Panel    Component Value Date/Time   CHOL 169 06/24/2014 1400   TRIG 48 06/24/2014 1400   HDL 70 06/24/2014 1400   CHOLHDL 2.4 06/24/2014 1400   VLDL 10 06/24/2014 1400   LDLCALC 89 06/24/2014 1400       Other studies Reviewed: Additional studies/ records that were reviewed today include: hospital notes and ct scan. .   ASSESSMENT AND PLAN:  1. Coronary disease status post CABG in 2011. Clinically stable.  2. Chronic congestive heart failure with ischemic cardiomyopathy. Ejection fraction 25%. On recent echo.  Patient has deferred  ICD placement. He is on appropriate medical therapy with ACE inhibitor, beta blocker, and Aldactone and appears euvolemic.  Last labs 7 days ago.  Will such loud rhonchi will recheck CXR.  3. 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  4. Tobacco abuse. Patient counseled on smoking cessation. He has reduced his use by half. Encourage complete cessation.  5. COPD with chronic bronchitis.  Recommend he use his inhalers and stop smoking. Follow up with Dr. Swaziland in 3 months or earlier if increased SOB.   Current medicines are reviewed with the patient today.  The patient Has no concerns regarding medicines.  The following changes have been made:  See above Labs/ tests ordered today include:see above  Disposition:   FU:  see above  Nyoka Lint, NP  09/23/2014 4:02 PM    [facility] [address] [place], [place], [place]  [address]/ [address] [place], [place] Phone: [phone]; Fax: [phone]  [phone]

## 2014-09-23 NOTE — Patient Instructions (Signed)
Your physician recommends that you schedule a follow-up appointment in: 2-3 Months with Dr Swaziland  A chest x-ray takes a picture of the organs and structures inside the chest, including the heart, lungs, and blood vessels. This test can show several things, including, whether the heart is enlarges; whether fluid is building up in the lungs; and whether pacemaker / defibrillator leads are still in place.

## 2014-09-29 ENCOUNTER — Ambulatory Visit
Admission: RE | Admit: 2014-09-29 | Discharge: 2014-09-29 | Disposition: A | Discharge status: Home / Self Care | Payer: Medicare Other | Source: Ambulatory Visit | Attending: Cardiology | Admitting: Cardiology

## 2014-09-29 DIAGNOSIS — R0602 Shortness of breath: Secondary | ICD-10-CM

## 2014-09-29 DIAGNOSIS — R0989 Other specified symptoms and signs involving the circulatory and respiratory systems: Secondary | ICD-10-CM

## 2014-10-05 ENCOUNTER — Telehealth: Payer: Self-pay

## 2014-10-05 MED ORDER — FUROSEMIDE 40 MG PO TABS: ORAL_TABLET | ORAL | Status: DC

## 2014-10-05 NOTE — Telephone Encounter (Signed)
Spoke to patient received a message from Nada Boozer NP. CXR shows more fluid.She advised to increase Lasix to 40 mg twice a day for 1 week then return to normal dose 40 mg daily.Advised to increase potassium to 20 meq twice a day for 1 week then return to 20 meq daily.Appointment scheduled with Dr.Jordan 11/06/14 at 4:15 pm.

## 2014-11-06 ENCOUNTER — Encounter: Payer: Self-pay | Admitting: Cardiology

## 2014-11-06 ENCOUNTER — Ambulatory Visit (INDEPENDENT_AMBULATORY_CARE_PROVIDER_SITE_OTHER): Payer: Medicare Other | Admitting: Cardiology

## 2014-11-06 VITALS — BP 120/58 | HR 86 | Ht 64.0 in | Wt 148.1 lb

## 2014-11-06 DIAGNOSIS — I25718 Atherosclerosis of autologous vein coronary artery bypass graft(s) with other forms of angina pectoris: Secondary | ICD-10-CM

## 2014-11-06 DIAGNOSIS — Z951 Presence of aortocoronary bypass graft: Secondary | ICD-10-CM

## 2014-11-06 DIAGNOSIS — I255 Ischemic cardiomyopathy: Secondary | ICD-10-CM

## 2014-11-06 DIAGNOSIS — I5022 Chronic systolic (congestive) heart failure: Secondary | ICD-10-CM | POA: Diagnosis not present

## 2014-11-06 DIAGNOSIS — I5042 Chronic combined systolic (congestive) and diastolic (congestive) heart failure: Secondary | ICD-10-CM | POA: Insufficient documentation

## 2014-11-06 HISTORY — DX: Chronic systolic (congestive) heart failure: I50.22

## 2014-11-06 MED ORDER — FUROSEMIDE 40 MG PO TABS
ORAL_TABLET | ORAL | Status: DC
Start: 2014-11-06 — End: 2015-08-31

## 2014-11-06 NOTE — Progress Notes (Signed)
Cory Dennis Date of Birth: 23-Dec-1945 Medical Record #[id number]  History of Present Illness: Cory Dennis is seen today for followup of coronary disease. He is status post CABG in February 2011 of the [facility]. He had a left ventricular aneurysm repair. Ejection fraction was 30%. Since his heart surgery he has had nonunion of the left parasternal region. He was evaluated here by Dr. Donata Dennis who did not recommend surgery for his sternum.  He recently was seen for symptoms of increased dyspnea. CTA of the chest was negative for PE. Bilateral effusions ans pulmonary edema noted. He was seen by Cory Boozer NP and lasix increased to 40 mg bid for one week then back to 40 mg daily. Today he reports his breathing is much better. Hasn't used oxygen for the last week. Able to go to the store without problems. No edema. Weight is actually up 2 lbs. No cough. CXR repeat on May 5 showed persistent bilateral effusions R>L.  Current Outpatient Prescriptions on File Prior to Visit  Medication Sig Dispense Refill  . albuterol (PROVENTIL HFA;VENTOLIN HFA) 108 (90 BASE) MCG/ACT inhaler Inhale 2 puffs into the lungs every 6 (six) hours as needed for shortness of breath.     Marland Kitchen aspirin 81 MG tablet Take 81 mg by mouth daily.    . budesonide-formoterol (SYMBICORT) 160-4.5 MCG/ACT inhaler Inhale 2 puffs into the lungs 2 (two) times daily.    . folic acid (FOLVITE) 1 MG tablet Take 1 tablet (1 mg total) by mouth daily. 30 tablet 6  . HYDROcodone-acetaminophen (LORTAB) 10-500 MG per tablet Take 1 tablet by mouth every 6 (six) hours as needed for pain.    Marland Kitchen ipratropium-albuterol (DUONEB) 0.5-2.5 (3) MG/3ML SOLN Take 3 mLs by nebulization every 4 (four) hours. 360 mL 1  . lisinopril (PRINIVIL,ZESTRIL) 10 MG tablet Take 1 tablet (10 mg total) by mouth daily. 30 tablet 0  . simvastatin (ZOCOR) 80 MG tablet Take 80 mg by mouth at bedtime.    Marland Kitchen spironolactone (ALDACTONE) 25 MG tablet Take 1 tablet (25 mg total) by  mouth daily.    Marland Kitchen thiamine 100 MG tablet Take 1 tablet (100 mg total) by mouth daily. 30 tablet 0  . tiotropium (SPIRIVA HANDIHALER) 18 MCG inhalation capsule Place 1 capsule (18 mcg total) into inhaler and inhale daily. 30 capsule 12  . varenicline (CHANTIX CONTINUING MONTH PAK) 1 MG tablet Take 1 tablet (1 mg total) by mouth 2 (two) times daily. 60 tablet 2  . varenicline (CHANTIX) 0.5 MG tablet Take 1 tablet (0.5 mg total) by mouth 2 (two) times daily. 60 tablet 0   No current facility-administered medications on file prior to visit.    Allergies  Allergen Reactions  . Penicillins Other (See Comments)    Doesn't remember    Past Medical History  Diagnosis Date  . Myocardial infarction   . Musculoskeletal chest pain   . Fatigue   . SOB (shortness of breath)   . Arthritis   . Anxiety   . Depression   . Syncope   . Ischemic cardiomyopathy   . COPD (chronic obstructive pulmonary disease)   . Hypertension   . Tobacco abuse   . Alcohol abuse   . CAD (coronary artery disease)   . Anemia   . Emphysema of lung   . CHF (congestive heart failure)   . Chronic systolic CHF (congestive heart failure) 11/06/2014    Past Surgical History  Procedure Laterality Date  . Coronary artery  bypass graft  2011    4v CABG [facility] in [place] in 06/2009 with LIMA to LAD, RIMA to distal RCA, sequential SVG to OM1/OM 2  . Left ventricular aneurysm repair      History  Smoking status  . Current Every Day Smoker -- 0.25 packs/day for 42 years  . Types: Cigarettes  Smokeless tobacco  . Never Used    Comment: working on it    History  Alcohol Use  . 12.0 - 14.4 oz/week  . 20-24 Cans of beer per week    Comment: 5-6 beers per day    Family History  Problem Relation Age of Onset  . Family history unknown: Yes    Review of Systems: The review of systems as noted in HPI. All other systems were reviewed and are negative.  Physical Exam: BP 120/58 mmHg  Pulse 86  Ht 5\' 4"  (1.626 m)   Wt 67.178 kg (148 lb 1.6 oz)  BMI 25.41 kg/m2 He is a chronically ill appearing white male in no acute distress. HEENT exam is unremarkable.  Neck is supple without JVD, adenopathy, thyromegaly, or bruits. Lungs reveal scant rhonchi. Decreased BS in bases. Cardiac exam reveals a regular rate and rhythm without gallop, murmur, or click. Chest wall reveals a median sternotomy scar. There is nonunion of the lower left parasternal region with  tenderness to palpation. There is a small ventral hernia in the subxiphoid area. Abdomen is soft and nontender without masses or bruits. There is no hepatosplenomegaly. Femoral and pedal pulses are 2+ and symmetric. He has no edema. Skin is warm and dry. He is alert and oriented x3. Cranial nerves II through XII are intact.  LABORATORY DATA: Lab Results  Component Value Date   WBC 8.2 09/07/2014   HGB 13.6 09/07/2014   HCT 40.0 09/07/2014   PLT 239 09/07/2014   GLUCOSE 104* 09/16/2014   CHOL 169 06/24/2014   TRIG 48 06/24/2014   HDL 70 06/24/2014   LDLCALC 89 06/24/2014   ALT 14 09/07/2014   AST 21 09/07/2014   NA 129* 09/16/2014   K 5.0 09/16/2014   CL 92* 09/16/2014   CREATININE 0.90 09/16/2014   BUN 16 09/16/2014   CO2 29 09/16/2014   TSH 1.147 08/19/2014     Assessment / Plan: 1. Coronary disease status post CABG in 2011. Clinically stable.  2. Chronic congestive heart failure with ischemic cardiomyopathy. Ejection fraction 35%. Patient has deferred to ICD placement. Symptomatically he is improved with recent increase in lasix but with persistent bilateral effusions by CXR. He is on appropriate medical therapy with ACE inhibitor, beta blocker, and Aldactone. I have recommended he stay on lasix 40 mg bid.  Will check follow up BMET in 2 weeks with BNP. I will see in 2 months.  3. 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  4. Tobacco abuse. Patient counseled on smoking cessation. He states he is going to try Chantix  again.  5. COPD with chronic bronchitis.

## 2014-11-06 NOTE — Patient Instructions (Signed)
Increase the lasix to 40 mg twice a day.   Continue your other therapy  We will check blood work in 2 weeks.  Keep appointment with Shon Hale on July 21 and with me in August.

## 2014-11-11 ENCOUNTER — Telehealth: Payer: Self-pay | Admitting: Cardiology

## 2014-11-11 MED ORDER — FUROSEMIDE 40 MG PO TABS: 40.0000 mg | ORAL_TABLET | Freq: Two times a day (BID) | ORAL | Status: DC

## 2014-11-11 NOTE — Telephone Encounter (Signed)
Spoke with pt, according to the last office note, the patient is to continue furosemide 40 mg bid. When the patient picked up his recent prescription it had different instructions. The patient has enough tablets to last until he has lab work drawn and he understands to take  40 mg twice daily.

## 2014-11-11 NOTE — Telephone Encounter (Signed)
Please call,question about his medicine(i think it might be his Furosemide). Pt was not sure of the name of his medicine.

## 2014-11-25 DIAGNOSIS — I5022 Chronic systolic (congestive) heart failure: Secondary | ICD-10-CM | POA: Diagnosis not present

## 2014-11-25 DIAGNOSIS — I25718 Atherosclerosis of autologous vein coronary artery bypass graft(s) with other forms of angina pectoris: Secondary | ICD-10-CM | POA: Diagnosis not present

## 2014-11-25 DIAGNOSIS — I255 Ischemic cardiomyopathy: Secondary | ICD-10-CM | POA: Diagnosis not present

## 2014-11-25 DIAGNOSIS — I209 Angina pectoris, unspecified: Secondary | ICD-10-CM | POA: Diagnosis not present

## 2014-11-25 DIAGNOSIS — Z951 Presence of aortocoronary bypass graft: Secondary | ICD-10-CM | POA: Diagnosis not present

## 2014-11-25 DIAGNOSIS — I509 Heart failure, unspecified: Secondary | ICD-10-CM | POA: Diagnosis not present

## 2014-11-25 DIAGNOSIS — I2589 Other forms of chronic ischemic heart disease: Secondary | ICD-10-CM | POA: Diagnosis not present

## 2014-11-26 ENCOUNTER — Other Ambulatory Visit: Payer: Self-pay

## 2014-11-26 DIAGNOSIS — E875 Hyperkalemia: Secondary | ICD-10-CM

## 2014-11-26 LAB — BASIC METABOLIC PANEL
BUN: 27 mg/dL — ABNORMAL HIGH (ref 6–23)
CALCIUM: 9.1 mg/dL (ref 8.4–10.5)
CHLORIDE: 93 meq/L — AB (ref 96–112)
CO2: 28 meq/L (ref 19–32)
Creat: 1.1 mg/dL (ref 0.50–1.35)
Glucose, Bld: 92 mg/dL (ref 70–99)
Potassium: 5.6 mEq/L — ABNORMAL HIGH (ref 3.5–5.3)
Sodium: 128 mEq/L — ABNORMAL LOW (ref 135–145)

## 2014-11-26 LAB — BRAIN NATRIURETIC PEPTIDE: BRAIN NATRIURETIC PEPTIDE: 492.6 pg/mL — AB (ref 0.0–100.0)

## 2014-12-01 DIAGNOSIS — E875 Hyperkalemia: Secondary | ICD-10-CM | POA: Diagnosis not present

## 2014-12-02 LAB — BASIC METABOLIC PANEL
BUN: 23 mg/dL (ref 6–23)
CO2: 27 meq/L (ref 19–32)
Calcium: 8.9 mg/dL (ref 8.4–10.5)
Chloride: 96 mEq/L (ref 96–112)
Creat: 1.18 mg/dL (ref 0.50–1.35)
Glucose, Bld: 77 mg/dL (ref 70–99)
Potassium: 4.8 mEq/L (ref 3.5–5.3)
Sodium: 132 mEq/L — ABNORMAL LOW (ref 135–145)

## 2014-12-14 ENCOUNTER — Other Ambulatory Visit: Payer: Self-pay | Admitting: Physician Assistant

## 2014-12-15 NOTE — Telephone Encounter (Signed)
Very conflicting notes on this medication.  Not refilled.  Pt has appt with provider 12/17/14.  Will discuss with him them before refilling.

## 2014-12-16 ENCOUNTER — Encounter: Payer: Self-pay | Admitting: Physician Assistant

## 2014-12-16 ENCOUNTER — Telehealth: Payer: Self-pay | Admitting: Cardiology

## 2014-12-16 NOTE — Telephone Encounter (Signed)
Agree. Medication refill DENIED.

## 2014-12-16 NOTE — Telephone Encounter (Signed)
Pt called in concerned about whether or not he should be taking his Chantix. Please f/u with him  Thanks

## 2014-12-17 ENCOUNTER — Encounter: Payer: Self-pay | Admitting: Physician Assistant

## 2014-12-17 ENCOUNTER — Ambulatory Visit (INDEPENDENT_AMBULATORY_CARE_PROVIDER_SITE_OTHER): Payer: Medicare Other | Admitting: Physician Assistant

## 2014-12-17 VITALS — BP 100/80 | HR 80 | Temp 98.1°F | Resp 20 | Wt 149.0 lb

## 2014-12-17 DIAGNOSIS — E875 Hyperkalemia: Secondary | ICD-10-CM | POA: Diagnosis not present

## 2014-12-17 DIAGNOSIS — Z951 Presence of aortocoronary bypass graft: Secondary | ICD-10-CM

## 2014-12-17 DIAGNOSIS — I255 Ischemic cardiomyopathy: Secondary | ICD-10-CM

## 2014-12-17 DIAGNOSIS — F1010 Alcohol abuse, uncomplicated: Secondary | ICD-10-CM | POA: Diagnosis not present

## 2014-12-17 DIAGNOSIS — Z720 Tobacco use: Secondary | ICD-10-CM | POA: Diagnosis not present

## 2014-12-17 DIAGNOSIS — I5022 Chronic systolic (congestive) heart failure: Secondary | ICD-10-CM | POA: Diagnosis not present

## 2014-12-17 DIAGNOSIS — J439 Emphysema, unspecified: Secondary | ICD-10-CM

## 2014-12-17 DIAGNOSIS — I25718 Atherosclerosis of autologous vein coronary artery bypass graft(s) with other forms of angina pectoris: Secondary | ICD-10-CM | POA: Diagnosis not present

## 2014-12-17 DIAGNOSIS — D638 Anemia in other chronic diseases classified elsewhere: Secondary | ICD-10-CM

## 2014-12-17 MED ORDER — FUROSEMIDE 40 MG PO TABS: 40.0000 mg | ORAL_TABLET | Freq: Two times a day (BID) | ORAL | Status: DC

## 2014-12-17 NOTE — Telephone Encounter (Signed)
Returned call to patient he stated he wanted to ask Dr.Jordan if ok to take chantix.Advised I will ask Dr.Jordan and call back this afternoon.Stated he is taking lasix

## 2014-12-17 NOTE — Telephone Encounter (Signed)
Stated he is taking lasix 40 mg twice a day and pharmacy still has 40 mg daily,needs new directions sent to pharmacy.Lasix refill sent to pharmacy.

## 2014-12-17 NOTE — Progress Notes (Signed)
Patient ID: Cory Dennis MRN: [id number], DOB: 08-08-1945, 69 y.o. Date of Encounter: @DATE @  Chief Complaint:  Chief Complaint  Patient presents with  . Follow-up    3 mos    HPI: 69 y.o. year old white male  Presents for follow-up after recent hospitalization.  THE FOLLOWING IS COPIED FROM HIS OV WITH ME 08/05/2014:  He presents as a new patient to establish care.  He goes to the [facility].  for his primary medical care.  He sees Dr. Swaziland for Cardiology.  [facility] says that [facility]  Says that he recently saw Dr. Swaziland and told him that the [facility] had done no labs in a long time and had not scheduled him routine follow-up with a primary provider in a long time. He states that Dr. Swaziland told him to get his own private PCP.  He also says that the [place] did his CABG surgery. He has had a lot of problems with his sternum since the surgery. Says that the [facility] was going [facility]. Says that he had a second opinion by Dr. Donata Clay told him not to have repeat surgery. Patient says he really does not trust the [place] doctors.  Patient states that Dr. Swaziland also recently checked labs that showed anemia and Dr. Swaziland told him to get him a private PCP to follow up this with.  Patient says that he hates going to the [place] and does not get good care there. At one point in our conversation, he mentions just following up here and stopping follow-up with the [place].  However at different part of the conversation he says that he does get his medications for free through the [place] and that he was planning to request a transfer to the [place] in [place]-- that he just found out that they had opened a [place] office in [place]  a couple weeks ago.  He says that he had no other specific concerns to be addressed today.  At that OV I did the following:   Checked Anemia Panel-- noted that prior CBC was normochromic normocytic anemia. Consistent with anemia  of chronic disease. Reviewed that he is a smoker. He reported that he was currently smoking about one third pack per day. Decreased to this amount since January. In January he was smoking 1 pack per day. 50+ years of smoking--has smoked since a teen.  Prescribed Chantix for smoking cessation.    For his 2nd OV with me, he presented on -08/18/2104--Presented with SOB and Left LE edema---I sent him to hospital.   He subsequently had follow-up office visit with me 09/07/2014. At that visit I reviewed his hospital discharge summary.   He was hospitalized 08/19/14 through 08/23/14. Hospitalized with CHF exacerbation and COPD exacerbation.  BNP 3689. Sodium 119. CXR showed CHF. CT angiogram negative for pulmonary embolism but did show CHF changes (right greater than left)  Was treated with IV Lasix as well as IV Solu-Medrol which was tapered and also azithromycin. Treated with nebulizer treatments and oxygen. Was discharged with oral Lasix beta blocker and ACE inhibitor. Azithromycin for 5 more days. As well he would be set up for home oxygen nebulizer machine and nebulizer medication along with Spiriiva. They planned for low sodium diet and fluid restriction and he is to be checking weights daily. Follow-up with cardiology and pulmonary as an outpatient.  EF 25%. Moderate Diastolic Dysfunction  Regarding his history  of alcohol use he was treated with folic acid and thiamine at time of discharge.  He had hyponatremia which was due to fluid overload and had resolved with Lasix. He did have renal consultation during the hospitalization.  They also noted right popliteal Baker's cyst. They had discussed it with her orthopedic surgeon on call Dr.Xu, who recommended outpatient follow-up.  At OV---09/07/2014:  Pt states that he does not have a set of scales at home therefore he has not been weighing himself. Patient states that he did not even realize that there were medications at the pharmacy for him  to pick up.  York Spaniel he did not even know until he got a phone call Sunday and he picked up the medications then.  He said that Lasix was one of these medicines and that he had not been taking any Lasix until his first dose just yesterday after getting call from the pharmacy yesterday.  He did take a dose yesterday and today.  He has an appointment with Nada Boozer nurse practitioner cardiology 09/23/14. He does not have any appointment scheduled with pulmonary or orthopedics. I discussed him needing these referrals. He states that he does not want to follow-up with pulmonary. Defers this referral Says "he knows what's wrong with his lungs----agent orange".  Also, has had some eval of this through the [place].   He also states that he is having no pain in the area of the back of his knee and is having no issues with this and does not want to follow-up with orthopedics either.  At OV 09/07/14-- he said that "the left leg swelling had gotten a lot better since they put him on that fluid pill at the hospital." Says his "breathing is good"--says he is not feeling shortness of breath or wheezing and feels that this is at baseline. Says "I feel better than I have in a while".  AT OV 09/16/2014: States that he has continued to take the Lasix 40 mg daily. States that he has not gotten a chance to get himself a set of scales. States that he sleeps on one pillow at night with no orthopnea. Says he has a hospital bed but is completely flat and head of the bed is not raised. Also discussed that lab results checked at last visit showed low protein. Says he did get the message to increase protein in his diet and to add Ensure. But, says "I havent gotten to the store yet" Asked what he usually eats. Says sometimes he'll have breakfast and if he does he'll have some eggs. Eats this and he won't eat another meal until later around dinner. Usually will have a meat and a vegetable. Asked if he feels that his shortness of  breath has further improved and he really doesn't give me much answer. Says that he isn't having any shortness of breath/difficulty breathing.  AT OV 12/17/2014: Reviewed that he had seen cardiology recently 11/06/2014. Reviewed  lab results from 11/06/14 and follow-up bmet 12/01/14. Followed up that he has stopped the spironolactone and is taking his lisinopril at 10 mg now--- as directed by cardiology at the time of those results.  He states that he feels that his breathing is okay. Says that he does not have any difficulty with breathing and does not think he is having any increased shortness of breath. Says that he sleeps on one pillow at night with no orthopnea. Says that he is having no swelling in his feet and ankles. Says he  still has no scales at home to be able to monitor his weight. Says that he restarted Chantix and is on the third day of it. Says that he is drinking about the same which he says is between 4-6 beers a day. He has no complaints or concerns today.  However as I reviewed medications etc. he states that he is not using any of the pulmonary medications on a routine basis. Says that he does have them with the other medications but he does not use them when he takes the oral meds. Says that occasionally he will see it (the Symbicort, etc) lying there and decided to use it but there is no rhyme or reason as to when he is using it according to what he is reporting to me.   Past Medical History  Diagnosis Date  . Myocardial infarction   . Musculoskeletal chest pain   . Fatigue   . SOB (shortness of breath)   . Arthritis   . Anxiety   . Depression   . Syncope   . Ischemic cardiomyopathy   . COPD (chronic obstructive pulmonary disease)   . Hypertension   . Tobacco abuse   . Alcohol abuse   . CAD (coronary artery disease)   . Anemia   . Emphysema of lung   . CHF (congestive heart failure)   . Chronic systolic CHF (congestive heart failure) 11/06/2014     Home  Meds: Outpatient Prescriptions Prior to Visit  Medication Sig Dispense Refill  . albuterol (PROVENTIL HFA;VENTOLIN HFA) 108 (90 BASE) MCG/ACT inhaler Inhale 2 puffs into the lungs every 6 (six) hours as needed for shortness of breath.     Marland Kitchen aspirin 81 MG tablet Take 81 mg by mouth daily.    . budesonide-formoterol (SYMBICORT) 160-4.5 MCG/ACT inhaler Inhale 2 puffs into the lungs 2 (two) times daily.    . folic acid (FOLVITE) 1 MG tablet Take 1 tablet (1 mg total) by mouth daily. 30 tablet 6  . furosemide (LASIX) 40 MG tablet Take 1 tablet (40 mg total) by mouth 2 (two) times daily. 60 tablet 6  . HYDROcodone-acetaminophen (LORTAB) 10-500 MG per tablet Take 1 tablet by mouth every 6 (six) hours as needed for pain.    Marland Kitchen ipratropium-albuterol (DUONEB) 0.5-2.5 (3) MG/3ML SOLN Take 3 mLs by nebulization every 4 (four) hours. 360 mL 1  . lisinopril (PRINIVIL,ZESTRIL) 10 MG tablet Take 1 tablet (10 mg total) by mouth daily. 30 tablet 6  . simvastatin (ZOCOR) 80 MG tablet Take 80 mg by mouth at bedtime.    . thiamine 100 MG tablet Take 1 tablet (100 mg total) by mouth daily. 30 tablet 0  . tiotropium (SPIRIVA HANDIHALER) 18 MCG inhalation capsule Place 1 capsule (18 mcg total) into inhaler and inhale daily. 30 capsule 12  . varenicline (CHANTIX CONTINUING MONTH PAK) 1 MG tablet Take 1 tablet (1 mg total) by mouth 2 (two) times daily. 60 tablet 2  . varenicline (CHANTIX) 0.5 MG tablet Take 1 tablet (0.5 mg total) by mouth 2 (two) times daily. 60 tablet 0  . spironolactone (ALDACTONE) 25 MG tablet Take 1 tablet (25 mg total) by mouth daily.     No facility-administered medications prior to visit.    Allergies:  Allergies  Allergen Reactions  . Penicillins Other (See Comments)    Doesn't remember    History   Social History  . Marital Status: Single    Spouse Name: N/A  . Number of Children: N/A  .  Years of Education: N/A   Occupational History  . Mechanic    Social History Main Topics   . Smoking status: Current Every Day Smoker -- 0.25 packs/day for 42 years    Types: Cigarettes  . Smokeless tobacco: Never Used     Comment: working on it  . Alcohol Use: 12.0 - 14.4 oz/week    20-24 Cans of beer per week     Comment: 5-6 beers per day  . Drug Use: No  . Sexual Activity: Not Currently   Other Topics Concern  . Not on file   Social History Narrative    Family History  Problem Relation Age of Onset  . Family history unknown: Yes     Review of Systems:  See HPI for pertinent ROS. All other ROS negative.    Physical Exam: Blood pressure 100/80, pulse 80, temperature 98.1 F (36.7 C), temperature source Oral, resp. rate 20, weight 149 lb (67.586 kg)., Body mass index is 25.56 kg/(m^2). General: WNWD WM. Appears in no acute distress. Appearance "weathered", chronically ill.   Neck: Supple. No thyromegaly. No lymphadenopathy. No bruits. Lungs: Distant, Decreased BS throughout, but clear. No wheezes, rhonchi, or rales.  Heart: RRR with S1 S2. No murmurs, rubs, or gallops. Abdomen: Soft, non-tender, non-distended with normoactive bowel sounds. No hepatomegaly. No rebound/guarding. No obvious abdominal masses. Musculoskeletal:  Strength and tone normal for age. Extremities/Skin: No LE Edema.  Neuro: Alert and oriented X 3. Moves all extremities spontaneously. Gait is normal. CNII-XII grossly in tact. Psych:  Responds to questions appropriately with a normal affect.     ASSESSMENT AND PLAN:  69 y.o. year old male with     Acute on chronic combined systolic and diastolic congestive heart failure  S/P CABG (coronary artery bypass graft) Coronary artery disease due to lipid rich plaque ----Managed by Cardiology  COPD  ------ explained to patient that he should be using all of these pulmonary medications (except the albuterol) on a daily routine basis regardless of how he is feeling that his breathing is. ------ explained that these medications will help to keep  things stable and prevent exacerbations. ------- asked if cost is an issue--as these medications typically are expensive especially for Medicare patients who cannot use savings cards. He states that he gets them free through the [place] Told him that he must take these as directed to treat them as he does his other medications.   Tobacco abuse --- He is on Chantix and is trying to quit smoking.   Alcohol abuse --Currently states that he is drinking 4-6 beers a day.   Anemia of chronic disease   He has follow-up appointment with cardiology August 11. Have him schedule follow-up visit here in 3 months. Will follow-up to make sure he is compliant with his pulmonary medications and f/u smoking cessation.  Instructed to follow-up sooner if needed.     Murray Hodgkins [place], [place], [facility] 12/17/2014 2:41 PM

## 2014-12-18 NOTE — Telephone Encounter (Signed)
Returned call to patient 12/17/14 Dr.Jordan advised ok to take chantix.Advised no alcohol.Advised if he notices any mood changes,bad dreams to stop taking.

## 2015-01-07 ENCOUNTER — Encounter: Payer: Self-pay | Admitting: Cardiology

## 2015-01-07 ENCOUNTER — Ambulatory Visit (INDEPENDENT_AMBULATORY_CARE_PROVIDER_SITE_OTHER): Payer: Medicare Other | Admitting: Cardiology

## 2015-01-07 VITALS — BP 116/60 | HR 74 | Ht 64.0 in | Wt 147.6 lb

## 2015-01-07 DIAGNOSIS — I5022 Chronic systolic (congestive) heart failure: Secondary | ICD-10-CM | POA: Diagnosis not present

## 2015-01-07 DIAGNOSIS — I255 Ischemic cardiomyopathy: Secondary | ICD-10-CM

## 2015-01-07 DIAGNOSIS — Z951 Presence of aortocoronary bypass graft: Secondary | ICD-10-CM | POA: Diagnosis not present

## 2015-01-07 DIAGNOSIS — I25708 Atherosclerosis of coronary artery bypass graft(s), unspecified, with other forms of angina pectoris: Secondary | ICD-10-CM | POA: Diagnosis not present

## 2015-01-07 NOTE — Patient Instructions (Signed)
Continue your current medications and sodium restriction  I will see you in 6 months.

## 2015-01-07 NOTE — Progress Notes (Signed)
Cory Dennis Date of Birth: [birth year] Medical Record #[id number]  History of Present Illness: Cory Dennis is seen today for followup of coronary disease. He is status post CABG in February 2011 of the [place]. He had a left ventricular aneurysm repair. Ejection fraction was 30%. Since his heart surgery he has had nonunion of the left parasternal region. He was evaluated here by Dr. Donata Dennis who did not recommend surgery for his sternum.  He  was seen in April 2016 for symptoms of increased dyspnea. CTA of the chest was negative for PE. Bilateral effusions ans pulmonary edema noted. His lasix was increased to 40 mg bid with marked improvement. His lisinopril dose was decreased due to hyperkalemia with improvement. On follow up today he is doing well. No SOB or chest pain. No edema. Weight stable. Still smoking.   Current Outpatient Prescriptions on File Prior to Visit  Medication Sig Dispense Refill  . albuterol (PROVENTIL HFA;VENTOLIN HFA) 108 (90 BASE) MCG/ACT inhaler Inhale 2 puffs into the lungs every 6 (six) hours as needed for shortness of breath.     Marland Kitchen aspirin 81 MG tablet Take 81 mg by mouth daily.    . budesonide-formoterol (SYMBICORT) 160-4.5 MCG/ACT inhaler Inhale 2 puffs into the lungs 2 (two) times daily.    . folic acid (FOLVITE) 1 MG tablet Take 1 tablet (1 mg total) by mouth daily. 30 tablet 6  . furosemide (LASIX) 40 MG tablet Take 1 tablet (40 mg total) by mouth 2 (two) times daily. 60 tablet 6  . HYDROcodone-acetaminophen (LORTAB) 10-500 MG per tablet Take 1 tablet by mouth every 6 (six) hours as needed for pain.    Marland Kitchen ipratropium-albuterol (DUONEB) 0.5-2.5 (3) MG/3ML SOLN Take 3 mLs by nebulization every 4 (four) hours. 360 mL 1  . lisinopril (PRINIVIL,ZESTRIL) 10 MG tablet Take 1 tablet (10 mg total) by mouth daily. 30 tablet 6  . tiotropium (SPIRIVA HANDIHALER) 18 MCG inhalation capsule Place 1 capsule (18 mcg total) into inhaler and inhale daily. 30 capsule 12  .  varenicline (CHANTIX CONTINUING MONTH PAK) 1 MG tablet Take 1 tablet (1 mg total) by mouth 2 (two) times daily. 60 tablet 2   No current facility-administered medications on file prior to visit.    Allergies  Allergen Reactions  . Penicillins Other (See Comments)    Doesn't remember    Past Medical History  Diagnosis Date  . Myocardial infarction   . Musculoskeletal chest pain   . Fatigue   . SOB (shortness of breath)   . Arthritis   . Anxiety   . Depression   . Syncope   . Ischemic cardiomyopathy   . COPD (chronic obstructive pulmonary disease)   . Hypertension   . Tobacco abuse   . Alcohol abuse   . CAD (coronary artery disease)   . Anemia   . Emphysema of lung   . CHF (congestive heart failure)   . Chronic systolic CHF (congestive heart failure) 11/06/2014    Past Surgical History  Procedure Laterality Date  . Coronary artery bypass graft  2011    4v CABG [facility] in [place] in 06/2009 with LIMA to LAD, RIMA to distal RCA, sequential SVG to OM1/OM 2  . Left ventricular aneurysm repair      History  Smoking status  . Current Every Day Smoker -- 0.25 packs/day for 42 years  . Types: Cigarettes  Smokeless tobacco  . Never Used    Comment: working on it  History  Alcohol Use  . 12.0 - 14.4 oz/week  . 20-24 Cans of beer per week    Comment: 5-6 beers per day    Family History  Problem Relation Age of Onset  . Family history unknown: Yes    Review of Systems: The review of systems as noted in HPI. All other systems were reviewed and are negative.  Physical Exam: BP 116/60 mmHg  Pulse 74  Ht  (1.626 m)  Wt 66.951 kg (147 lb 9.6 oz)  BMI 25.32 kg/m2 He is a chronically ill appearing white male in no acute distress. HEENT exam is unremarkable.  Neck is supple without JVD, adenopathy, thyromegaly, or bruits. Lungs reveal scant rhonchi. Decreased BS in bases. Cardiac exam reveals a regular rate and rhythm without gallop, murmur, or click. Chest  wall reveals a median sternotomy scar. There is nonunion of the lower left parasternal region with  tenderness to palpation. There is a small ventral hernia in the subxiphoid area. Abdomen is soft and nontender without masses or bruits. There is no hepatosplenomegaly. Femoral and pedal pulses are 2+ and symmetric. He has no edema. Skin is warm and dry. He is alert and oriented x3. Cranial nerves II through XII are intact.  LABORATORY DATA: Lab Results  Component Value Date   WBC 8.2 09/07/2014   HGB 13.6 09/07/2014   HCT 40.0 09/07/2014   PLT 239 09/07/2014   GLUCOSE 77 12/01/2014   CHOL 169 06/24/2014   TRIG 48 06/24/2014   HDL 70 06/24/2014   LDLCALC 89 06/24/2014   ALT 14 09/07/2014   AST 21 09/07/2014   NA 132* 12/01/2014   K 4.8 12/01/2014   CL 96 12/01/2014   CREATININE 1.18 12/01/2014   BUN 23 12/01/2014   CO2 27 12/01/2014   TSH 1.147 08/19/2014     Assessment / Plan: 1. Coronary disease status post CABG in 2011. Clinically stable.  2. Chronic congestive heart failure with ischemic cardiomyopathy. Ejection fraction 35%. Patient has deferred to ICD placement. Well compensated today.  He is on appropriate medical therapy with ACE inhibitor, beta blocker, and Aldactone. I have recommended he stay on lasix 40 mg bid.  I will follow up in 6 months.   3. 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  4. Tobacco abuse. Patient counseled on smoking cessation.   5. COPD with chronic bronchitis.   6. Hyperkalemia. Improved with reduction in lisinopril dose.

## 2015-02-18 ENCOUNTER — Telehealth: Payer: Self-pay

## 2015-02-18 ENCOUNTER — Other Ambulatory Visit: Payer: Self-pay

## 2015-02-18 MED ORDER — FUROSEMIDE 40 MG PO TABS: 40.0000 mg | ORAL_TABLET | Freq: Two times a day (BID) | ORAL | Status: DC

## 2015-02-18 NOTE — Telephone Encounter (Signed)
Spoke to patient Dr.Jordan received a message from [place] wanting to know if you are taking Toprol.Patient stated he is not taking and does not remember taking.Advised I will speak to Dr.Jordan next week and see if you need to be taking.

## 2015-03-02 ENCOUNTER — Emergency Department (HOSPITAL_COMMUNITY)
Admission: EM | Admit: 2015-03-02 | Discharge: 2015-03-03 | Discharge status: Against Medical Advice | Payer: Medicare Other | Attending: Emergency Medicine | Admitting: Emergency Medicine

## 2015-03-02 ENCOUNTER — Emergency Department (HOSPITAL_COMMUNITY): Payer: Medicare Other

## 2015-03-02 ENCOUNTER — Encounter (HOSPITAL_COMMUNITY): Payer: Self-pay | Admitting: Emergency Medicine

## 2015-03-02 DIAGNOSIS — M1990 Unspecified osteoarthritis, unspecified site: Secondary | ICD-10-CM | POA: Insufficient documentation

## 2015-03-02 DIAGNOSIS — R05 Cough: Secondary | ICD-10-CM | POA: Insufficient documentation

## 2015-03-02 DIAGNOSIS — Z720 Tobacco use: Secondary | ICD-10-CM | POA: Diagnosis not present

## 2015-03-02 DIAGNOSIS — Z8659 Personal history of other mental and behavioral disorders: Secondary | ICD-10-CM | POA: Insufficient documentation

## 2015-03-02 DIAGNOSIS — R0981 Nasal congestion: Secondary | ICD-10-CM | POA: Insufficient documentation

## 2015-03-02 DIAGNOSIS — Z7982 Long term (current) use of aspirin: Secondary | ICD-10-CM | POA: Insufficient documentation

## 2015-03-02 DIAGNOSIS — J441 Chronic obstructive pulmonary disease with (acute) exacerbation: Secondary | ICD-10-CM | POA: Diagnosis not present

## 2015-03-02 DIAGNOSIS — T148 Other injury of unspecified body region: Secondary | ICD-10-CM | POA: Diagnosis not present

## 2015-03-02 DIAGNOSIS — Z880 Allergy status to penicillin: Secondary | ICD-10-CM | POA: Diagnosis not present

## 2015-03-02 DIAGNOSIS — J3489 Other specified disorders of nose and nasal sinuses: Secondary | ICD-10-CM | POA: Insufficient documentation

## 2015-03-02 DIAGNOSIS — I10 Essential (primary) hypertension: Secondary | ICD-10-CM | POA: Diagnosis not present

## 2015-03-02 DIAGNOSIS — Z7951 Long term (current) use of inhaled steroids: Secondary | ICD-10-CM | POA: Insufficient documentation

## 2015-03-02 DIAGNOSIS — R4020 Unspecified coma: Secondary | ICD-10-CM

## 2015-03-02 DIAGNOSIS — I252 Old myocardial infarction: Secondary | ICD-10-CM | POA: Diagnosis not present

## 2015-03-02 DIAGNOSIS — E871 Hypo-osmolality and hyponatremia: Secondary | ICD-10-CM | POA: Diagnosis not present

## 2015-03-02 DIAGNOSIS — Z79899 Other long term (current) drug therapy: Secondary | ICD-10-CM | POA: Insufficient documentation

## 2015-03-02 DIAGNOSIS — S199XXA Unspecified injury of neck, initial encounter: Secondary | ICD-10-CM | POA: Diagnosis not present

## 2015-03-02 DIAGNOSIS — Z5329 Procedure and treatment not carried out because of patient's decision for other reasons: Secondary | ICD-10-CM

## 2015-03-02 DIAGNOSIS — Z5320 Procedure and treatment not carried out because of patient's decision for unspecified reasons: Secondary | ICD-10-CM

## 2015-03-02 DIAGNOSIS — Z951 Presence of aortocoronary bypass graft: Secondary | ICD-10-CM | POA: Diagnosis not present

## 2015-03-02 DIAGNOSIS — I2510 Atherosclerotic heart disease of native coronary artery without angina pectoris: Secondary | ICD-10-CM | POA: Diagnosis not present

## 2015-03-02 DIAGNOSIS — R55 Syncope and collapse: Secondary | ICD-10-CM | POA: Insufficient documentation

## 2015-03-02 DIAGNOSIS — D649 Anemia, unspecified: Secondary | ICD-10-CM | POA: Diagnosis not present

## 2015-03-02 DIAGNOSIS — S0181XA Laceration without foreign body of other part of head, initial encounter: Secondary | ICD-10-CM | POA: Diagnosis not present

## 2015-03-02 DIAGNOSIS — I5022 Chronic systolic (congestive) heart failure: Secondary | ICD-10-CM | POA: Diagnosis not present

## 2015-03-02 DIAGNOSIS — R0602 Shortness of breath: Secondary | ICD-10-CM | POA: Diagnosis not present

## 2015-03-02 DIAGNOSIS — S0990XA Unspecified injury of head, initial encounter: Secondary | ICD-10-CM | POA: Diagnosis not present

## 2015-03-02 LAB — BASIC METABOLIC PANEL
ANION GAP: 12 (ref 5–15)
BUN: 18 mg/dL (ref 6–20)
CHLORIDE: 90 mmol/L — AB (ref 101–111)
CO2: 20 mmol/L — ABNORMAL LOW (ref 22–32)
Calcium: 8.2 mg/dL — ABNORMAL LOW (ref 8.9–10.3)
Creatinine, Ser: 1.02 mg/dL (ref 0.61–1.24)
GFR calc Af Amer: >60 mL/min (ref >60)
GFR calc non Af Amer: >60 mL/min (ref >60)
GLUCOSE: 118 mg/dL — AB (ref 65–99)
POTASSIUM: 3.4 mmol/L — AB (ref 3.5–5.1)
Sodium: 122 mmol/L — ABNORMAL LOW (ref 135–145)

## 2015-03-02 LAB — CBC
HEMATOCRIT: 35.4 % — AB (ref 39.0–52.0)
HEMOGLOBIN: 12.5 g/dL — AB (ref 13.0–17.0)
MCH: 32.2 pg (ref 26.0–34.0)
MCHC: 35.3 g/dL (ref 30.0–36.0)
MCV: 91.2 fL (ref 78.0–100.0)
Platelets: 201 10*3/uL (ref 150–400)
RBC: 3.88 MIL/uL — ABNORMAL LOW (ref 4.22–5.81)
RDW: 13.6 % (ref 11.5–15.5)
WBC: 13 10*3/uL — ABNORMAL HIGH (ref 4.0–10.5)

## 2015-03-02 LAB — I-STAT TROPONIN, ED: Troponin i, poc: 0.01 ng/mL (ref 0.00–0.08)

## 2015-03-02 MED ORDER — LEVOFLOXACIN IN D5W 750 MG/150ML IV SOLN: 750.0000 mg | Freq: Once | INTRAVENOUS | Status: DC

## 2015-03-02 MED ORDER — ALBUTEROL SULFATE (2.5 MG/3ML) 0.083% IN NEBU
5.0000 mg | INHALATION_SOLUTION | Freq: Once | RESPIRATORY_TRACT | Status: AC
  Administered 2015-03-02: 5 mg via RESPIRATORY_TRACT
  Filled 2015-03-02: qty 6

## 2015-03-02 NOTE — Discharge Instructions (Signed)
Hyponatremia Hyponatremia is when the amount of salt (sodium) in your blood is too low. When sodium levels are low, your cells absorb extra water and they swell. The swelling happens throughout the body, but it mostly affects the brain. CAUSES This condition may be caused by:  Heart, kidney, or liver problems.  Thyroid problems.  Adrenal gland problems.  Metabolic conditions, such as syndrome of inappropriate antidiuretic hormone (SIADH).  Severe vomiting and diarrhea.  Certain medicines or illegal drugs.  Dehydration.  Drinking too much water.  Eating a diet that is low in sodium.  Large burns on your body.  Sweating. RISK FACTORS This condition is more likely to develop in people who:  Have long-term (chronic) kidney disease.  Have heart failure.  Have a medical condition that causes frequent or excessive diarrhea.  Have metabolic conditions, such as Addison disease or SIADH.  Take certain medicines that affect the sodium and fluid balance in the blood. Some of these medicine types include:  Diuretics.  NSAIDs.  Some opioid pain medicines.  Some antidepressants.  Some seizure prevention medicines. SYMPTOMS  Symptoms of this condition include:  Nausea and vomiting.  Confusion.  Lethargy.  Agitation.  Headache.  Seizures.  Unconsciousness.  Appetite loss.  Muscle weakness and cramping.  Feeling weak or light-headed.  Having a rapid heart rate.  Fainting, in severe cases. DIAGNOSIS This condition is diagnosed with a medical history and physical exam. You will also have other tests, including:  Blood tests.  Urine tests. TREATMENT Treatment for this condition depends on the cause. Treatment may include:  Fluids given through an IV tube that is inserted into one of your veins.  Medicines to correct the sodium imbalance. If medicines are causing the condition, the medicines will need to be adjusted.  Limiting water or fluid intake to  get the correct sodium balance. HOME CARE INSTRUCTIONS  Take medicines only as directed by your health care provider. Many medicines can make this condition worse. Talk with your health care provider about any medicines that you are currently taking.  Carefully follow a recommended diet as directed by your health care provider.  Carefully follow instructions from your health care provider about fluid restrictions.  Keep all follow-up visits as directed by your health care provider. This is important.  Do not drink alcohol. SEEK MEDICAL CARE IF:  You develop worsening nausea, fatigue, headache, confusion, or weakness.  Your symptoms go away and then return.  You have problems following the recommended diet. SEEK IMMEDIATE MEDICAL CARE IF:  You have a seizure.  You faint.  You have ongoing diarrhea or vomiting.   This information is not intended to replace advice given to you by your health care provider. Make sure you discuss any questions you have with your health care provider.   Document Released: 05/05/2002 Document Revised: 09/29/2014 Document Reviewed: 06/04/2014 Elsevier Interactive Patient Education 2016 [employer]. Discharge Against Medical Advice I am signing this paper to show that I am leaving this hospital or health care center of my own free will. It is done against all medical advice. In doing so, I am releasing this hospital or health care center and the attending physicians from any and all claims that I may want to make. I understand that further care has been recommended. My condition may worsen. This could cause me further bodily injury, illness, or even death. I do know that the medical staff has fully explained to me the risk that I am taking in leaving  against medical advice. Document Released: 05/15/2005 Document Revised: 08/07/2011 Document Reviewed: 10/30/2006 [facility] Patient Information 2015 [place], [place]. This information is not intended to replace  advice given to you by your health care provider. Make sure you discuss any questions you have with your health care provider.

## 2015-03-02 NOTE — ED Provider Notes (Signed)
CSN: [id number]     Arrival date & time 03/02/15  2137 History   First MD Initiated Contact with Patient 03/02/15 2215     Chief Complaint  Patient presents with  . Loss of Consciousness     (Consider location/radiation/quality/duration/timing/severity/associated sxs/prior Treatment) Patient is a 69 y.o. male presenting with syncope.  Loss of Consciousness Episode history:  Multiple Most recent episode:  Today Timing:  Rare Progression:  Resolved Chronicity:  New Context: standing up   Context comment:  Coughing Witnessed: yes   Associated symptoms: no chest pain, no diaphoresis, no difficulty breathing, no fever, no headaches, no nausea, no palpitations, no seizures, no shortness of breath, no vomiting and no weakness     Past Medical History  Diagnosis Date  . Myocardial infarction (HCC)   . Musculoskeletal chest pain   . Fatigue   . SOB (shortness of breath)   . Arthritis   . Anxiety   . Depression   . Syncope   . Ischemic cardiomyopathy   . COPD (chronic obstructive pulmonary disease) (HCC)   . Hypertension   . Tobacco abuse   . Alcohol abuse   . CAD (coronary artery disease)   . Anemia   . Emphysema of lung (HCC)   . CHF (congestive heart failure) (HCC)   . Chronic systolic CHF (congestive heart failure) (HCC) 11/06/2014   Past Surgical History  Procedure Laterality Date  . Coronary artery bypass graft  2011    4v CABG [facility] in [place] in 06/2009 with LIMA to LAD, RIMA to distal RCA, sequential SVG to OM1/OM 2  . Left ventricular aneurysm repair     Family History  Problem Relation Age of Onset  . Family history unknown: Yes   Social History  Substance Use Topics  . Smoking status: Current Every Day Smoker -- 0.25 packs/day for 42 years    Types: Cigarettes  . Smokeless tobacco: Never Used     Comment: working on it  . Alcohol Use: 12.0 - 14.4 oz/week    20-24 Cans of beer per week     Comment: 5-6 beers per day    Review of Systems  Unable to  perform ROS Constitutional: Negative for fever and diaphoresis.  HENT: Positive for congestion, rhinorrhea and sinus pressure.   Respiratory: Positive for cough. Negative for shortness of breath.   Cardiovascular: Positive for syncope. Negative for chest pain, palpitations and leg swelling.  Gastrointestinal: Negative for nausea and vomiting.  Neurological: Negative for seizures, weakness and headaches.  All other systems reviewed and are negative.     Allergies  Penicillins  Home Medications   Prior to Admission medications   Medication Sig Start Date End Date Taking? Authorizing Provider  albuterol (PROVENTIL HFA;VENTOLIN HFA) 108 (90 BASE) MCG/ACT inhaler Inhale 2 puffs into the lungs every 6 (six) hours as needed for shortness of breath.     Historical Provider, MD  aspirin 81 MG tablet Take 81 mg by mouth daily.    Historical Provider, MD  budesonide-formoterol (SYMBICORT) 160-4.5 MCG/ACT inhaler Inhale 2 puffs into the lungs 2 (two) times daily.    Historical Provider, MD  folic acid (FOLVITE) 1 MG tablet Take 1 tablet (1 mg total) by mouth daily. 02/25/14   Peter M [place], MD  furosemide (LASIX) 40 MG tablet Take 1 tablet (40 mg total) by mouth 2 (two) times daily. 02/18/15   Peter M [place], MD  HYDROcodone-acetaminophen (LORTAB) 10-500 MG per tablet Take 1 tablet by mouth every  6 (six) hours as needed for pain.    Historical Provider, MD  ipratropium-albuterol (DUONEB) 0.5-2.5 (3) MG/3ML SOLN Take 3 mLs by nebulization every 4 (four) hours. 08/23/14   Leroy Sea, MD  lisinopril (PRINIVIL,ZESTRIL) 10 MG tablet Take 1 tablet (10 mg total) by mouth daily. 11/26/14   Marykay Lex, MD  tiotropium (SPIRIVA HANDIHALER) 18 MCG inhalation capsule Place 1 capsule (18 mcg total) into inhaler and inhale daily. 08/23/14   Leroy Sea, MD  varenicline (CHANTIX CONTINUING MONTH PAK) 1 MG tablet Take 1 tablet (1 mg total) by mouth 2 (two) times daily. 08/05/14   Mary B Dixon, PA-C   BP  125/65 mmHg  Pulse 99  Temp(Src) 98.8 F (37.1 C) (Oral)  Resp 20  Ht  (1.626 m)  Wt 147 lb (66.679 kg)  BMI 25.22 kg/m2  SpO2 94% Physical Exam  Constitutional: He is oriented to person, place, and time. He appears well-developed. No distress.  HENT:  Head: Normocephalic and atraumatic.  Eyes: Conjunctivae are normal.  Neck: Normal range of motion. Neck supple.  No midline cervical tenderness  Cardiovascular: Normal rate.   Pulmonary/Chest: Effort normal. No respiratory distress. He has wheezes. He exhibits no tenderness.  Bilateral wheezing throughout lobes.   Abdominal: Soft. He exhibits no distension. There is no tenderness.  Musculoskeletal: Normal range of motion. He exhibits no edema or tenderness.  Neurological: He is alert and oriented to person, place, and time. No cranial nerve deficit. He exhibits normal muscle tone. Coordination normal.  Skin: Skin is warm and dry. He is not diaphoretic.  Psychiatric: He has a normal mood and affect. His behavior is normal. Judgment and thought content normal.  Nursing note and vitals reviewed.   ED Course  Procedures (including critical care time) Labs Review Labs Reviewed  BASIC METABOLIC PANEL - Abnormal; Notable for the following:    Sodium 122 (*)    Potassium 3.4 (*)    Chloride 90 (*)    CO2 20 (*)    Glucose, Bld 118 (*)    Calcium 8.2 (*)    All other components within normal limits  CBC - Abnormal; Notable for the following:    WBC 13.0 (*)    RBC 3.88 (*)    Hemoglobin 12.5 (*)    HCT 35.4 (*)    All other components within normal limits  URINALYSIS, ROUTINE W REFLEX MICROSCOPIC (NOT AT [facility])  CBG MONITORING, ED  Rosezena Sensor, ED    Imaging Review Dg Chest 2 View  03/02/2015   CLINICAL DATA:  Short of breath tonight  EXAM: CHEST  2 VIEW  COMPARISON:  09/29/2014  FINDINGS: Mild cardiomegaly. Ill-defined opacities along the lateral inferior and anterior right hemi thorax associated with blunting of  the right costophrenic angle have improved. Findings most likely represent an improved partially loculated right pleural effusion. Associated improved airspace disease is also a consideration. No pneumothorax.  IMPRESSION: Improved pleural and parenchymal disease at the right lung base.   Electronically Signed   By: Jolaine Click M.D.   On: 03/02/2015 22:28   Ct Head Wo Contrast  03/03/2015   CLINICAL DATA:  Larey Seat off a bar stool, striking the back of his head on a pool table today  EXAM: CT HEAD WITHOUT CONTRAST  CT CERVICAL SPINE WITHOUT CONTRAST  TECHNIQUE: Multidetector CT imaging of the head and cervical spine was performed following the standard protocol without intravenous contrast. Multiplanar CT image reconstructions of the cervical spine  were also generated.  COMPARISON:  11/01/2009  FINDINGS: CT HEAD FINDINGS  There is no intracranial hemorrhage, mass or evidence of acute infarction. There is moderate generalized atrophy. There is moderate chronic microvascular ischemic change. There is no significant extra-axial fluid collection.  No acute intracranial findings are evident. The calvarium and skullbase are intact. There are maxillary sinus air-fluid levels bilaterally and there is occlusion of many of the ethmoid air cells. Mild membrane thickening is present in the sphenoid sinuses.  CT CERVICAL SPINE FINDINGS  The vertebral column, pedicles and facet articulations are intact. There is no evidence of acute fracture. No acute soft tissue abnormalities are evident.  Mild midcervical degenerative disc changes are present.  IMPRESSION: 1. Negative for acute intracranial traumatic injury. There is moderate generalized atrophy and chronic microvascular disease. 2. Negative for acute cervical spine fracture.   Electronically Signed   By: Ellery Plunk M.D.   On: 03/03/2015 00:04   Ct Cervical Spine Wo Contrast  03/03/2015   CLINICAL DATA:  Larey Seat off a bar stool, striking the back of his head on a pool  table today  EXAM: CT HEAD WITHOUT CONTRAST  CT CERVICAL SPINE WITHOUT CONTRAST  TECHNIQUE: Multidetector CT imaging of the head and cervical spine was performed following the standard protocol without intravenous contrast. Multiplanar CT image reconstructions of the cervical spine were also generated.  COMPARISON:  11/01/2009  FINDINGS: CT HEAD FINDINGS  There is no intracranial hemorrhage, mass or evidence of acute infarction. There is moderate generalized atrophy. There is moderate chronic microvascular ischemic change. There is no significant extra-axial fluid collection.  No acute intracranial findings are evident. The calvarium and skullbase are intact. There are maxillary sinus air-fluid levels bilaterally and there is occlusion of many of the ethmoid air cells. Mild membrane thickening is present in the sphenoid sinuses.  CT CERVICAL SPINE FINDINGS  The vertebral column, pedicles and facet articulations are intact. There is no evidence of acute fracture. No acute soft tissue abnormalities are evident.  Mild midcervical degenerative disc changes are present.  IMPRESSION: 1. Negative for acute intracranial traumatic injury. There is moderate generalized atrophy and chronic microvascular disease. 2. Negative for acute cervical spine fracture.   Electronically Signed   By: Ellery Plunk M.D.   On: 03/03/2015 00:04   I have personally reviewed and evaluated these images and lab results as part of my medical decision-making.   EKG Interpretation   Date/Time:  Tuesday March 02 2015 21:37:51 EDT Ventricular Rate:  95 PR Interval:  98 QRS Duration: 111 QT Interval:  538 QTC Calculation: 676 R Axis:   94 Text Interpretation:  Sinus rhythm Short PR interval Probable left atrial  enlargement Right axis deviation Low voltage, extremity leads Borderline  ST elevation, anterolateral leads Prolonged QT interval Confirmed by KNOTT  MD, DANIEL ([id number]) on 03/02/2015 11:53:55 PM      MDM   Patient  presents emergency perm today after loss of consciousness while at a bar. He states that he had been having a coughing spell when he passed out. He does not remember hitting his head. He had a similar episode yesterday while coughing. States that he has had a increase in his nasal congestion and believes he has a cold. He denies any fevers. He denies any chest pain or shortness of breath. Patient is currently on home oxygen but did not bring his oxygen to the bar with him.  Patient has obvious wheezing bilaterally was given 125 Solu-Medrol and 5 mg  albuterol by EMS. Here patient is at his home oxygen requirement however does have bilateral wheezing. Patient states that is normal for him. He is not appear fluid overloaded and has no peripheral edema or JVD. Chest x-ray shows improvement from his previous chest x-ray.   Given risk factors of CAD and CHF, patient with syncopal episode will more than likely come in to hospital.   Patient found to have hyponatremia and slight leukocytosis. Given hyponatremia of 122 we want to admit this patient for syncopal episode as this could have been a seizure. Patient has slight leukocytosis however does not have any clear signs of pneumonia and his upper respiratory infection seems to be consistent primarily congestion and rhinorrhea. Patient was advised that he should be admitted but refused. He is alert oriented and competent to make these decisions. His family and friends were side him and urged him to be admitted however he refused. They state that he is very adamant about not being admitted hospital but will follow up with his primary care physician. We explained to Mr. Eckhart that this could result in permanent damage, disability or death. We advised him that his sodium was extremely low. Patient understood the risk and benefits of his AMA departure and left prior to his CT being read. Patient was given return precautions and advised to return to the emergency  department should he have any worsening of symptoms.  Final diagnoses:  LOC (loss of consciousness)  Acute hyponatremia  Left against medical advice      Deirdre Peer, MD 03/03/15 [id number]  Lyndal Pulley, MD 03/03/15 [phone]

## 2015-03-02 NOTE — ED Notes (Signed)
Pt was at a bar and fell off of stool and hit the back of his head on a pool table and "passed out" according to bystanders. Upon ems arrival pt was alert and ox4. Pt denies any pain at this time. Pt does have labored breathing with expiratory wheezing and rhonchi noted in lower lobes. Denies any SOB. Ems gave 5mg  albuterol and soul medrol.

## 2015-03-02 NOTE — ED Provider Notes (Signed)
I saw and evaluated the patient, reviewed the resident's note and I agree with the findings and plan. Please see associated encounter note.   EKG Interpretation   Date/Time:  Tuesday March 02 2015 21:37:51 EDT Ventricular Rate:  95 PR Interval:  98 QRS Duration: 111 QT Interval:  538 QTC Calculation: 676 R Axis:   94 Text Interpretation:  Sinus rhythm Short PR interval Probable left atrial  enlargement Right axis deviation Low voltage, extremity leads Borderline  ST elevation, anterolateral leads Prolonged QT interval Confirmed by Doyt Castellana  MD, Fatih Stalvey [phone]) on 03/02/2015 11:53:55 PM     Date: 03/02/2015 Patient: Cory Dennis Admitted: 03/02/2015  9:37 PM Attending Provider: Lyndal Pulley, MD  [name] or his authorized caregiver has made the decision for the patient to leave the emergency department against the advice of Lyndal Pulley, MD.  He or his authorized caregiver has been informed and understands the inherent risks, including death.  He or his authorized caregiver has decided to accept the responsibility for this decision. [name] and all necessary parties have been advised that he may return for further evaluation or treatment. His condition at time of discharge was serious. He is clinically sober.  [name] had current vital signs as follows:  Blood pressure 125/65, pulse 99, temperature 98.8 F (37.1 C), temperature source Oral, resp. rate 20, height 5\' 4"  (1.626 m), weight 147 lb (66.679 kg), SpO2 94 %.   [name] or his authorized caregiver has not signed the Leaving Against Medical Advice form prior to leaving the department.  69 year old male in no apparent distress presents after syncopal episode where he struck his head. He is refusing all care currently although he is notably hyponatremic and has borderline QTC prolongation. We recommended admission to the hospital and the patient decided to sign out AMA.  Lyndal Pulley, MD 03/03/15  [phone]

## 2015-03-02 NOTE — ED Notes (Signed)
Pt leaving AMA, ambulatory, states understanding of discharge instructions

## 2015-03-03 ENCOUNTER — Telehealth: Payer: Self-pay

## 2015-03-03 MED ORDER — METOPROLOL TARTRATE 25 MG PO TABS: 25.0000 mg | ORAL_TABLET | Freq: Two times a day (BID) | ORAL | Status: DC

## 2015-03-03 NOTE — Telephone Encounter (Signed)
Spoke with Dr.Jordan he advised needs to be taking metoprolol 25 mg twice a day.Prescription sent to pharmacy.

## 2015-03-08 ENCOUNTER — Other Ambulatory Visit: Payer: Self-pay

## 2015-03-08 ENCOUNTER — Other Ambulatory Visit: Payer: Self-pay | Admitting: Cardiology

## 2015-03-08 MED ORDER — FOLIC ACID 1 MG PO TABS: 1.0000 mg | ORAL_TABLET | Freq: Every day | ORAL | Status: DC

## 2015-03-08 NOTE — Telephone Encounter (Signed)
Pt is returning Cheryl's call  ° °Thanks  °

## 2015-03-22 ENCOUNTER — Encounter: Payer: Self-pay | Admitting: Physician Assistant

## 2015-03-22 ENCOUNTER — Ambulatory Visit (INDEPENDENT_AMBULATORY_CARE_PROVIDER_SITE_OTHER): Payer: Medicare Other | Admitting: Physician Assistant

## 2015-03-22 VITALS — BP 102/60 | HR 84 | Temp 97.8°F | Resp 18 | Wt 150.0 lb

## 2015-03-22 DIAGNOSIS — I25708 Atherosclerosis of coronary artery bypass graft(s), unspecified, with other forms of angina pectoris: Secondary | ICD-10-CM

## 2015-03-22 DIAGNOSIS — F1010 Alcohol abuse, uncomplicated: Secondary | ICD-10-CM

## 2015-03-22 DIAGNOSIS — J439 Emphysema, unspecified: Secondary | ICD-10-CM | POA: Diagnosis not present

## 2015-03-22 DIAGNOSIS — I5022 Chronic systolic (congestive) heart failure: Secondary | ICD-10-CM | POA: Diagnosis not present

## 2015-03-22 DIAGNOSIS — I255 Ischemic cardiomyopathy: Secondary | ICD-10-CM | POA: Diagnosis not present

## 2015-03-22 DIAGNOSIS — E871 Hypo-osmolality and hyponatremia: Secondary | ICD-10-CM | POA: Diagnosis not present

## 2015-03-22 DIAGNOSIS — Z951 Presence of aortocoronary bypass graft: Secondary | ICD-10-CM | POA: Diagnosis not present

## 2015-03-22 DIAGNOSIS — D638 Anemia in other chronic diseases classified elsewhere: Secondary | ICD-10-CM

## 2015-03-22 DIAGNOSIS — D72829 Elevated white blood cell count, unspecified: Secondary | ICD-10-CM | POA: Diagnosis not present

## 2015-03-22 DIAGNOSIS — Z23 Encounter for immunization: Secondary | ICD-10-CM

## 2015-03-22 DIAGNOSIS — Z720 Tobacco use: Secondary | ICD-10-CM

## 2015-03-22 LAB — CBC WITH DIFFERENTIAL/PLATELET
BASOS ABS: 0 10*3/uL (ref 0.0–0.1)
Basophils Relative: 0 % (ref 0–1)
Eosinophils Absolute: 0 10*3/uL (ref 0.0–0.7)
Eosinophils Relative: 0 % (ref 0–5)
HEMATOCRIT: 38.8 % — AB (ref 39.0–52.0)
HEMOGLOBIN: 13.4 g/dL (ref 13.0–17.0)
LYMPHS PCT: 31 % (ref 12–46)
Lymphs Abs: 2.6 10*3/uL (ref 0.7–4.0)
MCH: 32.3 pg (ref 26.0–34.0)
MCHC: 34.5 g/dL (ref 30.0–36.0)
MCV: 93.5 fL (ref 78.0–100.0)
MPV: 9.4 fL (ref 8.6–12.4)
Monocytes Absolute: 1.3 10*3/uL — ABNORMAL HIGH (ref 0.1–1.0)
Monocytes Relative: 15 % — ABNORMAL HIGH (ref 3–12)
NEUTROS ABS: 4.6 10*3/uL (ref 1.7–7.7)
Neutrophils Relative %: 54 % (ref 43–77)
Platelets: 264 10*3/uL (ref 150–400)
RBC: 4.15 MIL/uL — AB (ref 4.22–5.81)
RDW: 13.6 % (ref 11.5–15.5)
WBC: 8.5 10*3/uL (ref 4.0–10.5)

## 2015-03-22 NOTE — Progress Notes (Signed)
Patient ID: Cory Dennis MRN: [id number], DOB: 10/23/1945, 69 y.o. Date of Encounter: @DATE @  Chief Complaint:  Chief Complaint  Patient presents with  . 3 mth check up    is fasting    HPI: 69 y.o. year old white male  Presents for follow-up after recent hospitalization.  THE FOLLOWING IS COPIED FROM HIS OV WITH ME 08/05/2014:  He presents as a new patient to establish care.  He goes to the [facility].  for his primary medical care.  He sees Dr. Swaziland for Cardiology.  [facility] says that [facility]  Says that he recently saw Dr. Swaziland and told him that the [facility] had done no labs in a long time and had not scheduled him routine follow-up with a primary provider in a long time. He states that Dr. Swaziland told him to get his own private PCP.  He also says that the [place] did his CABG surgery. He has had a lot of problems with his sternum since the surgery. Says that the [facility] was going [facility]. Says that he had a second opinion by Dr. Donata Clay told him not to have repeat surgery. Patient says he really does not trust the [place] doctors.  Patient states that Dr. Swaziland also recently checked labs that showed anemia and Dr. Swaziland told him to get him a private PCP to follow up this with.  Patient says that he hates going to the [facility] and does not get good care there. At one point in our conversation, he mentions just following up here and stopping follow-up with the [place].  However at different part of the conversation he says that he does get his medications for free through the [place] and that he was planning to request a transfer to the [place] in [place]-- that he just found out that they had opened a [place] office in [place]  a couple weeks ago.  He says that he had no other specific concerns to be addressed today.  At that OV I did the following:   Checked Anemia Panel-- noted that prior CBC was normochromic normocytic anemia. Consistent  with anemia of chronic disease. Reviewed that he is a smoker. He reported that he was currently smoking about one third pack per day. Decreased to this amount since January. In January he was smoking 1 pack per day. 50+ years of smoking--has smoked since a teen.  Prescribed Chantix for smoking cessation.    For his 2nd OV with me, he presented on -08/18/2104--Presented with SOB and Left LE edema---I sent him to hospital.   He subsequently had follow-up office visit with me 09/07/2014. At that visit I reviewed his hospital discharge summary.   He was hospitalized 08/19/14 through 08/23/14. Hospitalized with CHF exacerbation and COPD exacerbation.  BNP 3689. Sodium 119. CXR showed CHF. CT angiogram negative for pulmonary embolism but did show CHF changes (right greater than left)  Was treated with IV Lasix as well as IV Solu-Medrol which was tapered and also azithromycin. Treated with nebulizer treatments and oxygen. Was discharged with oral Lasix beta blocker and ACE inhibitor. Azithromycin for 5 more days. As well he would be set up for home oxygen nebulizer machine and nebulizer medication along with Spiriiva. They planned for low sodium diet and fluid restriction and he is to be checking weights daily. Follow-up with cardiology and pulmonary as an outpatient.  EF 25%. Moderate Diastolic Dysfunction  Regarding his history of alcohol use he was treated with folic acid and thiamine at time of discharge.  He had hyponatremia which was due to fluid overload and had resolved with Lasix. He did have renal consultation during the hospitalization.  They also noted right popliteal Baker's cyst. They had discussed it with orthopedic surgeon on call Dr.Xu, who recommended outpatient follow-up.  At OV---09/07/2014:  Pt states that he does not have a set of scales at home therefore he has not been weighing himself. Patient states that he did not even realize that there were medications at the pharmacy  for him to pick up.  York Spaniel he did not even know until he got a phone call Sunday and he picked up the medications then.  He said that Lasix was one of these medicines and that he had not been taking any Lasix until his first dose just yesterday after getting call from the pharmacy yesterday.  He did take a dose yesterday and today.  He has an appointment with Nada Boozer nurse practitioner cardiology 09/23/14. He does not have any appointment scheduled with pulmonary or orthopedics. I discussed him needing these referrals. He states that he does not want to follow-up with pulmonary. Defers this referral Says "he knows what's wrong with his lungs----agent orange".  Also, has had some eval of this through the [place].   He also states that he is having no pain in the area of the back of his knee and is having no issues with this and does not want to follow-up with orthopedics either.  At OV 09/07/14-- he said that "the left leg swelling had gotten a lot better since they put him on that fluid pill at the hospital." Says his "breathing is good"--says he is not feeling shortness of breath or wheezing and feels that this is at baseline. Says "I feel better than I have in a while".  AT OV 09/16/2014: States that he has continued to take the Lasix 40 mg daily. States that he has not gotten a chance to get himself a set of scales. States that he sleeps on one pillow at night with no orthopnea. Says he has a hospital bed but is completely flat and head of the bed is not raised. Also discussed that lab results checked at last visit showed low protein. Says he did get the message to increase protein in his diet and to add Ensure. But, says "I havent gotten to the store yet" Asked what he usually eats. Says sometimes he'll have breakfast and if he does he'll have some eggs. Eats this and he won't eat another meal until later around dinner. Usually will have a meat and a vegetable. Asked if he feels that his shortness  of breath has further improved and he really doesn't give me much answer. Says that he isn't having any shortness of breath/difficulty breathing.  AT OV 12/17/2014: Reviewed that he had seen cardiology recently 11/06/2014. Reviewed  lab results from 11/06/14 and follow-up bmet 12/01/14. Followed up that he has stopped the spironolactone and is taking his lisinopril at 10 mg now--- as directed by cardiology at the time of those results.  He states that he feels that his breathing is okay. Says that he does not have any difficulty with breathing and does not think he is having any increased shortness of breath. Says that he sleeps on one pillow at night with no orthopnea. Says that he is having no swelling in his feet and ankles.  Says he still has no scales at home to be able to monitor his weight. Says that he restarted Chantix and is on the third day of it. Says that he is drinking about the same which he says is between 4-6 beers a day. He has no complaints or concerns today.  However as I reviewed medications etc. he states that he is not using any of the pulmonary medications on a routine basis. Says that he does have them with the other medications but he does not use them when he takes the oral meds. Says that occasionally he will see it (the Symbicort, etc) lying there and decided to use it but there is no rhyme or reason as to when he is using it according to what he is reporting to me.  OV 03/22/2015: He states that he is feeling good. Says that his breathing feels like it has been good and at baseline. No increased shortness of breath. No orthopnea. Also states that he is having no swelling in his feet and legs. Says that he is not taking Chantix. Says that it seemed like it made him smoke even more. Since quitting Chantix, thinks he's actually smoking less. Asked about his alcohol intake. He says that yesterday he just drank 1 beer and that was all. Asked about most days and he says usually 4, 5  or 6 beers on most days. Initially says that there is no other alcohol but then he adds that "occasionally he may have a shot of moonshine."  Reviewed that he had a recent ER note secondary to syncope. He says that " he had passed out like that before and that wasn't the first time."  Says that he hasn't had any recurrent episodes. Says that he "went to the hospital and they did a bunch of tests and couldn't tell him what was wrong,so he didn't see any point to stay in there any longer." No recurrent syncope or presyncope since that episode. No complaints or concerns today.   Past Medical History  Diagnosis Date  . Myocardial infarction (HCC)   . Musculoskeletal chest pain   . Fatigue   . SOB (shortness of breath)   . Arthritis   . Anxiety   . Depression   . Syncope   . Ischemic cardiomyopathy   . COPD (chronic obstructive pulmonary disease) (HCC)   . Hypertension   . Tobacco abuse   . Alcohol abuse   . CAD (coronary artery disease)   . Anemia   . Emphysema of lung (HCC)   . CHF (congestive heart failure) (HCC)   . Chronic systolic CHF (congestive heart failure) (HCC) 11/06/2014     Home Meds: Outpatient Prescriptions Prior to Visit  Medication Sig Dispense Refill  . albuterol (PROVENTIL HFA;VENTOLIN HFA) 108 (90 BASE) MCG/ACT inhaler Inhale 2 puffs into the lungs every 6 (six) hours as needed for shortness of breath.     Marland Kitchen aspirin 81 MG tablet Take 81 mg by mouth daily.    . budesonide-formoterol (SYMBICORT) 160-4.5 MCG/ACT inhaler Inhale 2 puffs into the lungs 2 (two) times daily.    . folic acid (FOLVITE) 1 MG tablet Take 1 tablet (1 mg total) by mouth daily. 30 tablet 6  . furosemide (LASIX) 40 MG tablet Take 1 tablet (40 mg total) by mouth 2 (two) times daily. 60 tablet 6  . ipratropium-albuterol (DUONEB) 0.5-2.5 (3) MG/3ML SOLN Take 3 mLs by nebulization every 4 (four) hours. 360 mL 1  . lisinopril (PRINIVIL,ZESTRIL)  10 MG tablet Take 1 tablet (10 mg total) by mouth daily.  30 tablet 6  . metoprolol tartrate (LOPRESSOR) 25 MG tablet Take 1 tablet (25 mg total) by mouth 2 (two) times daily. 60 tablet 6  . tiotropium (SPIRIVA HANDIHALER) 18 MCG inhalation capsule Place 1 capsule (18 mcg total) into inhaler and inhale daily. 30 capsule 12  . HYDROcodone-acetaminophen (LORTAB) 10-500 MG per tablet Take 1 tablet by mouth every 6 (six) hours as needed for pain.    . varenicline (CHANTIX CONTINUING MONTH PAK) 1 MG tablet Take 1 tablet (1 mg total) by mouth 2 (two) times daily. (Patient not taking: Reported on 03/22/2015) 60 tablet 2   No facility-administered medications prior to visit.    Allergies:  Allergies  Allergen Reactions  . Penicillins Other (See Comments)    Doesn't remember    Social History   Social History  . Marital Status: Single    Spouse Name: N/A  . Number of Children: N/A  . Years of Education: N/A   Occupational History  . Mechanic    Social History Main Topics  . Smoking status: Current Every Day Smoker -- 0.25 packs/day for 42 years    Types: Cigarettes  . Smokeless tobacco: Never Used     Comment: working on it  . Alcohol Use: 12.0 - 14.4 oz/week    20-24 Cans of beer per week     Comment: 5-6 beers per day  . Drug Use: No  . Sexual Activity: Not Currently   Other Topics Concern  . Not on file   Social History Narrative    Family History  Problem Relation Age of Onset  . Family history unknown: Yes     Review of Systems:  See HPI for pertinent ROS. All other ROS negative.    Physical Exam: Blood pressure 102/60, pulse 84, temperature 97.8 F (36.6 C), temperature source Oral, resp. rate 18, weight 150 lb (68.04 kg)., Body mass index is 25.73 kg/(m^2). General: WNWD WM. Appears in no acute distress. Appearance "weathered", chronically ill.   Neck: Supple. No thyromegaly. No lymphadenopathy. No bruits. Lungs: Distant, Decreased BS throughout, but clear. No wheezes, rhonchi, or rales.  Heart: RRR with S1 S2. No  murmurs, rubs, or gallops. Abdomen: Soft, non-tender, non-distended with normoactive bowel sounds. No hepatomegaly. No rebound/guarding. No obvious abdominal masses. Musculoskeletal:  Strength and tone normal for age. Extremities/Skin: No LE Edema.  Neuro: Alert and oriented X 3. Moves all extremities spontaneously. Gait is normal. CNII-XII grossly in tact. Psych:  Responds to questions appropriately with a normal affect.     ASSESSMENT AND PLAN:  69 y.o. year old male with     Acute on chronic combined systolic and diastolic congestive heart failure  S/P CABG (coronary artery bypass graft) Coronary artery disease due to lipid rich plaque ----Managed by Cardiology  COPD  ------ explained to patient that he should be using all of these pulmonary medications (except the albuterol) on a daily routine basis regardless of how he is feeling that his breathing is. ------ explained that these medications will help to keep things stable and prevent exacerbations. ------- asked if cost is an issue--as these medications typically are expensive especially for Medicare patients who cannot use savings cards. He states that he gets them free through the [place] Told him that he must take these as directed to treat them as he does his other medications.   Tobacco abuse --- He is on Chantix and is trying to  quit smoking.   Alcohol abuse --Currently states that he is drinking 4-6 beers a day.   Anemia of chronic disease  1. Pulmonary emphysema, unspecified emphysema type (HCC 2. Chronic systolic CHF (congestive heart failure) (HCC) 3. Ischemic cardiomyopathy 4. Need for prophylactic vaccination and inoculation against influenza - Flu Vaccine QUAD 36+ mos IM 5. 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 Tobacco abuse 7. S/P CABG (coronary artery bypass graft) 8. Alcohol abuse 9. Anemia of chronic disease 10. Hyponatremia - BASIC METABOLIC  PANEL WITH GFR 11. Leukocytosis - CBC with Differential/Platelet  Reviewed the ER note 03/02/15. Note stated that the patient presented after loss of consciousness while at a bar. He stated that he had a coughing spell when he passed out. Did not recall hitting his head. Stated he had a similar episode the prior day while coughing. Reported he had increased nasal congestion. Denied any fever. Denied any chest pain or shortness of breath. Noted that the patient was on home oxygen but did not bring his oxygen to the bar with him. He had obvious wheezing bilaterally and was given 120 Solu-Medrol and 5 mg albuterol by EMS. In the ER he did not appear fluid overloaded and had no peripheral edema or JVD. Chest x-ray showed improvement from previous chest x-ray.  He was found to have hyponatremia and slight leukocytosis. Given hyponatremia of 122 they wanted to admit him for syncopal episode as it could be a seizure. Also noted  slight leukocytosis however did not have any clear signs of pneumonia and upper respiratory infection seemed to be consistent with primary congestion or rhinorrhea. He was advised that he needed to be admitted. He refused. Patient understood risk benefits of the AMA departure and left prior to obtaining CT results.  03/02/15 chest x-ray showed improved pleural and parenchymal disease at the right lung base  03/03/15 CT head and cervical spine showed negative for acute intracranial traumatic injury. Moderate generalized atrophy and chronic microvascular disease. Negative for acute cervical spine fracture.   Will recheck BMET and CBC today to follow-up this.  Murray Hodgkins [place], [place], [facility] 03/22/2015 2:33 PM

## 2015-03-23 LAB — BASIC METABOLIC PANEL WITH GFR
BUN: 17 mg/dL (ref 7–25)
CHLORIDE: 96 mmol/L — AB (ref 98–110)
CO2: 26 mmol/L (ref 20–31)
Calcium: 9 mg/dL (ref 8.6–10.3)
Creat: 1.18 mg/dL (ref 0.70–1.25)
GFR, Est African American: 72 mL/min (ref >=60)
GFR, Est Non African American: 63 mL/min (ref >=60)
Glucose, Bld: 99 mg/dL (ref 70–99)
POTASSIUM: 4.2 mmol/L (ref 3.5–5.3)
SODIUM: 132 mmol/L — AB (ref 135–146)

## 2015-03-29 ENCOUNTER — Encounter: Payer: Self-pay | Admitting: Family Medicine

## 2015-04-19 ENCOUNTER — Ambulatory Visit (INDEPENDENT_AMBULATORY_CARE_PROVIDER_SITE_OTHER): Payer: Medicare Other | Admitting: Physician Assistant

## 2015-04-19 ENCOUNTER — Encounter: Payer: Self-pay | Admitting: Physician Assistant

## 2015-04-19 VITALS — BP 104/60 | HR 72 | Temp 97.5°F | Resp 18 | Wt 149.0 lb

## 2015-04-19 DIAGNOSIS — Z720 Tobacco use: Secondary | ICD-10-CM | POA: Diagnosis not present

## 2015-04-19 DIAGNOSIS — I5022 Chronic systolic (congestive) heart failure: Secondary | ICD-10-CM

## 2015-04-19 DIAGNOSIS — Z951 Presence of aortocoronary bypass graft: Secondary | ICD-10-CM | POA: Diagnosis not present

## 2015-04-19 DIAGNOSIS — I25708 Atherosclerosis of coronary artery bypass graft(s), unspecified, with other forms of angina pectoris: Secondary | ICD-10-CM | POA: Diagnosis not present

## 2015-04-19 DIAGNOSIS — D638 Anemia in other chronic diseases classified elsewhere: Secondary | ICD-10-CM

## 2015-04-19 DIAGNOSIS — F1010 Alcohol abuse, uncomplicated: Secondary | ICD-10-CM | POA: Diagnosis not present

## 2015-04-19 DIAGNOSIS — I255 Ischemic cardiomyopathy: Secondary | ICD-10-CM

## 2015-04-19 DIAGNOSIS — J439 Emphysema, unspecified: Secondary | ICD-10-CM

## 2015-04-19 NOTE — Progress Notes (Signed)
Patient ID: Cory Dennis MRN: [id number], DOB: 19-Mar-1946, 69 y.o. Date of Encounter: @DATE @  Chief Complaint:  Chief Complaint  Patient presents with  . problem with legs    family reports they have noticed rt sided problems, curlling of his hands, occasional confusion, "in a daze"  . episodes of coughing and passing out    HPI: 69 y.o. year old white male  presents with his daughter-in-law for OV today.  She says that she brought him in for visit today because of things that she is concerned about.  She says that it seems like he has been limping when he walks recently. Says that she has noticed this over the last 1 week. Patient states that he was working in the garage working on a motor and have left a medicine the floor and got tripped up on that. Says that since then his leg feels stiff and causes him to limp a little. Says that all that's wrong with that. Says he does not feel like his knee is giving way. Says he is having no significant pain. Says that the leg just feels stiff and that's why he has been limping.  Also daughter-in-law is concerned because he has had coughing spells and then passes out. Reviewed that I was aware of one of these episodes that occurred 03/02/15 at which time he was evaluated at the ER. She states that in addition to that episode he had another episode this past Saturday night. Says that at that time again he was coughing and then loses his a year and passed out.  Discussed that at the that which time he went to the ER that that episode occurred at a bar while drinking alcohol. Discussed his alcohol intake. She reassures me that he drinks 3-4 beers per day but used to drink a lot more and says that he never drinks more than 4-5 beers per day now.  Reviewed the ER records from 03/02/15. Reviewed that his sodium was very low at the time of that visit. Reviewed the alcohol is one thing that can cause low sodium. Also discussed that at the time of that ER  visit EMS had treated him with Solu-Medrol and albuterol nebulizer. Also discussed that he was not using his oxygen at the time of that episode. Discussed that we need to keep his lung function as good as possible to help prevent these episodes. I reviewed his medication list. This includes Spiriva, nebulizer treatment, Symbicort. Pt states that he does not use any of these medications on a regular basis. His not use these daily at all. Is not uses oxygen as directed. Asked him why and he says that he can't give me any good reason. Says he just gets busy with things and doesn't take time to do it.   Past Medical History  Diagnosis Date  . Myocardial infarction (HCC)   . Musculoskeletal chest pain   . Fatigue   . SOB (shortness of breath)   . Arthritis   . Anxiety   . Depression   . Syncope   . Ischemic cardiomyopathy   . COPD (chronic obstructive pulmonary disease) (HCC)   . Hypertension   . Tobacco abuse   . Alcohol abuse   . CAD (coronary artery disease)   . Anemia   . Emphysema of lung (HCC)   . CHF (congestive heart failure) (HCC)   . Chronic systolic CHF (congestive heart failure) (HCC) 11/06/2014     Home Meds: Outpatient  Prescriptions Prior to Visit  Medication Sig Dispense Refill  . albuterol (PROVENTIL HFA;VENTOLIN HFA) 108 (90 BASE) MCG/ACT inhaler Inhale 2 puffs into the lungs every 6 (six) hours as needed for shortness of breath.     Marland Kitchen aspirin 81 MG tablet Take 81 mg by mouth daily.    . budesonide-formoterol (SYMBICORT) 160-4.5 MCG/ACT inhaler Inhale 2 puffs into the lungs 2 (two) times daily.    . folic acid (FOLVITE) 1 MG tablet Take 1 tablet (1 mg total) by mouth daily. 30 tablet 6  . furosemide (LASIX) 40 MG tablet Take 1 tablet (40 mg total) by mouth 2 (two) times daily. 60 tablet 6  . ipratropium-albuterol (DUONEB) 0.5-2.5 (3) MG/3ML SOLN Take 3 mLs by nebulization every 4 (four) hours. 360 mL 1  . lisinopril (PRINIVIL,ZESTRIL) 10 MG tablet Take 1 tablet (10  mg total) by mouth daily. 30 tablet 6  . metoprolol tartrate (LOPRESSOR) 25 MG tablet Take 1 tablet (25 mg total) by mouth 2 (two) times daily. 60 tablet 6  . tiotropium (SPIRIVA HANDIHALER) 18 MCG inhalation capsule Place 1 capsule (18 mcg total) into inhaler and inhale daily. 30 capsule 12  . HYDROcodone-acetaminophen (LORTAB) 10-500 MG per tablet Take 1 tablet by mouth every 6 (six) hours as needed for pain.    . varenicline (CHANTIX CONTINUING MONTH PAK) 1 MG tablet Take 1 tablet (1 mg total) by mouth 2 (two) times daily. (Patient not taking: Reported on 03/22/2015) 60 tablet 2   No facility-administered medications prior to visit.    Allergies:  Allergies  Allergen Reactions  . Penicillins Other (See Comments)    Doesn't remember    Social History   Social History  . Marital Status: Single    Spouse Name: N/A  . Number of Children: N/A  . Years of Education: N/A   Occupational History  . Mechanic    Social History Main Topics  . Smoking status: Current Every Day Smoker -- 0.25 packs/day for 42 years    Types: Cigarettes  . Smokeless tobacco: Never Used     Comment: working on it  . Alcohol Use: 12.0 - 14.4 oz/week    20-24 Cans of beer per week     Comment: 5-6 beers per day  . Drug Use: No  . Sexual Activity: Not Currently   Other Topics Concern  . Not on file   Social History Narrative    Family History  Problem Relation Age of Onset  . Family history unknown: Yes     Review of Systems:  See HPI for pertinent ROS. All other ROS negative.    Physical Exam: Blood pressure 104/60, pulse 72, temperature 97.5 F (36.4 C), temperature source Oral, resp. rate 18, weight 149 lb (67.586 kg)., Body mass index is 25.56 kg/(m^2). General: WM. Appears in no acute distress. Neck: Supple. No thyromegaly. No lymphadenopathy. Lungs: Distant, Decreased breath sounds but no active wheezing  Heart: RRR with S1 S2. No murmurs, rubs, or gallops. Musculoskeletal:  Strength  and tone normal for age. I watched him walk across the exam room and he is able to walk without knee limp. He reports he has no area of tenderness no swelling no ecchymosis . Extremities/Skin: Warm and dry. Neuro: Alert and oriented X 3. Moves all extremities spontaneously. Gait is normal. CNII-XII grossly in tact. Psych:  Responds to questions appropriately with a normal affect.     ASSESSMENT AND PLAN:  69 y.o. year old male with  1. Pulmonary emphysema,  unspecified emphysema type (HCC) Discussed need for compliance of his medications  2. Chronic systolic CHF (congestive heart failure) (HCC) This is currently stable 3. Ischemic cardiomyopathy This is currently stable on current treatment 4. 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 Tobacco abuse He has been educated regarding need for cessation 6. S/P CABG (coronary artery bypass graft) This is currently stable/controlled 7. Alcohol abuse Discussed the need to make sure to limit alcohol use and also the effect alcohol can have on sodium. 8. Anemia of chronic disease This is been stable.  Daughter-in-law states that she sees patient twice a day. Discussed with both of them the need for compliance with medications, oxygen and decrease of alcohol and tobacco.   Signed, [address] [place], [place], [facility] 04/19/2015 4:43 PM

## 2015-05-05 ENCOUNTER — Inpatient Hospital Stay (HOSPITAL_COMMUNITY)
Admission: EM | Admit: 2015-05-05 | Discharge: 2015-05-09 | DRG: 026 | Disposition: A | Discharge status: Home / Self Care | Payer: Medicare Other | Attending: Neurological Surgery | Admitting: Neurological Surgery

## 2015-05-05 ENCOUNTER — Emergency Department (HOSPITAL_COMMUNITY): Payer: Medicare Other | Admitting: Anesthesiology

## 2015-05-05 ENCOUNTER — Emergency Department (HOSPITAL_COMMUNITY): Payer: Medicare Other

## 2015-05-05 ENCOUNTER — Encounter (HOSPITAL_COMMUNITY)
Admission: EM | Disposition: A | Discharge status: Home / Self Care | Payer: Self-pay | Source: Home / Self Care | Attending: Neurological Surgery

## 2015-05-05 ENCOUNTER — Encounter (HOSPITAL_COMMUNITY): Payer: Self-pay | Admitting: Emergency Medicine

## 2015-05-05 DIAGNOSIS — S065X0A Traumatic subdural hemorrhage without loss of consciousness, initial encounter: Principal | ICD-10-CM | POA: Diagnosis present

## 2015-05-05 DIAGNOSIS — I509 Heart failure, unspecified: Secondary | ICD-10-CM | POA: Diagnosis not present

## 2015-05-05 DIAGNOSIS — F17210 Nicotine dependence, cigarettes, uncomplicated: Secondary | ICD-10-CM | POA: Diagnosis not present

## 2015-05-05 DIAGNOSIS — R402252 Coma scale, best verbal response, oriented, at arrival to emergency department: Secondary | ICD-10-CM | POA: Diagnosis not present

## 2015-05-05 DIAGNOSIS — G8191 Hemiplegia, unspecified affecting right dominant side: Secondary | ICD-10-CM | POA: Diagnosis not present

## 2015-05-05 DIAGNOSIS — F419 Anxiety disorder, unspecified: Secondary | ICD-10-CM | POA: Diagnosis present

## 2015-05-05 DIAGNOSIS — Z951 Presence of aortocoronary bypass graft: Secondary | ICD-10-CM

## 2015-05-05 DIAGNOSIS — F329 Major depressive disorder, single episode, unspecified: Secondary | ICD-10-CM | POA: Diagnosis not present

## 2015-05-05 DIAGNOSIS — I2510 Atherosclerotic heart disease of native coronary artery without angina pectoris: Secondary | ICD-10-CM | POA: Diagnosis not present

## 2015-05-05 DIAGNOSIS — M1990 Unspecified osteoarthritis, unspecified site: Secondary | ICD-10-CM | POA: Diagnosis not present

## 2015-05-05 DIAGNOSIS — S065X9A Traumatic subdural hemorrhage with loss of consciousness of unspecified duration, initial encounter: Secondary | ICD-10-CM

## 2015-05-05 DIAGNOSIS — I110 Hypertensive heart disease with heart failure: Secondary | ICD-10-CM | POA: Diagnosis present

## 2015-05-05 DIAGNOSIS — I252 Old myocardial infarction: Secondary | ICD-10-CM | POA: Diagnosis not present

## 2015-05-05 DIAGNOSIS — R4701 Aphasia: Secondary | ICD-10-CM | POA: Diagnosis not present

## 2015-05-05 DIAGNOSIS — W19XXXA Unspecified fall, initial encounter: Secondary | ICD-10-CM | POA: Diagnosis present

## 2015-05-05 DIAGNOSIS — R402362 Coma scale, best motor response, obeys commands, at arrival to emergency department: Secondary | ICD-10-CM | POA: Diagnosis present

## 2015-05-05 DIAGNOSIS — F1010 Alcohol abuse, uncomplicated: Secondary | ICD-10-CM | POA: Diagnosis present

## 2015-05-05 DIAGNOSIS — R531 Weakness: Secondary | ICD-10-CM | POA: Diagnosis present

## 2015-05-05 DIAGNOSIS — S065X0S Traumatic subdural hemorrhage without loss of consciousness, sequela: Secondary | ICD-10-CM | POA: Diagnosis not present

## 2015-05-05 DIAGNOSIS — Z7982 Long term (current) use of aspirin: Secondary | ICD-10-CM | POA: Diagnosis not present

## 2015-05-05 DIAGNOSIS — J449 Chronic obstructive pulmonary disease, unspecified: Secondary | ICD-10-CM | POA: Diagnosis present

## 2015-05-05 DIAGNOSIS — S065XAA Traumatic subdural hemorrhage with loss of consciousness status unknown, initial encounter: Secondary | ICD-10-CM | POA: Diagnosis present

## 2015-05-05 DIAGNOSIS — R402142 Coma scale, eyes open, spontaneous, at arrival to emergency department: Secondary | ICD-10-CM | POA: Diagnosis not present

## 2015-05-05 DIAGNOSIS — I5022 Chronic systolic (congestive) heart failure: Secondary | ICD-10-CM | POA: Diagnosis not present

## 2015-05-05 DIAGNOSIS — Z880 Allergy status to penicillin: Secondary | ICD-10-CM | POA: Diagnosis not present

## 2015-05-05 DIAGNOSIS — I6203 Nontraumatic chronic subdural hemorrhage: Secondary | ICD-10-CM | POA: Diagnosis not present

## 2015-05-05 DIAGNOSIS — I6200 Nontraumatic subdural hemorrhage, unspecified: Secondary | ICD-10-CM | POA: Diagnosis not present

## 2015-05-05 DIAGNOSIS — I517 Cardiomegaly: Secondary | ICD-10-CM | POA: Diagnosis not present

## 2015-05-05 HISTORY — PX: CRANIOTOMY: SHX93

## 2015-05-05 LAB — BASIC METABOLIC PANEL
Anion gap: 13 (ref 5–15)
BUN: 16 mg/dL (ref 6–20)
CALCIUM: 9.1 mg/dL (ref 8.9–10.3)
CO2: 22 mmol/L (ref 22–32)
CREATININE: 1.19 mg/dL (ref 0.61–1.24)
Chloride: 98 mmol/L — ABNORMAL LOW (ref 101–111)
GFR calc non Af Amer: >60 mL/min (ref >60)
Glucose, Bld: 104 mg/dL — ABNORMAL HIGH (ref 65–99)
Potassium: 4.4 mmol/L (ref 3.5–5.1)
SODIUM: 133 mmol/L — AB (ref 135–145)

## 2015-05-05 LAB — URINALYSIS, ROUTINE W REFLEX MICROSCOPIC
Glucose, UA: NEGATIVE mg/dL
KETONES UR: 15 mg/dL — AB
Leukocytes, UA: NEGATIVE
Nitrite: NEGATIVE
PROTEIN: 30 mg/dL — AB
Specific Gravity, Urine: 1.02 (ref 1.005–1.030)
pH: 5.5 (ref 5.0–8.0)

## 2015-05-05 LAB — CBC
HCT: 40.4 % (ref 39.0–52.0)
Hemoglobin: 14 g/dL (ref 13.0–17.0)
MCH: 32 pg (ref 26.0–34.0)
MCHC: 34.7 g/dL (ref 30.0–36.0)
MCV: 92.4 fL (ref 78.0–100.0)
PLATELETS: 283 10*3/uL (ref 150–400)
RBC: 4.37 MIL/uL (ref 4.22–5.81)
RDW: 13 % (ref 11.5–15.5)
WBC: 10.1 10*3/uL (ref 4.0–10.5)

## 2015-05-05 LAB — TROPONIN I
TROPONIN I: 0.05 ng/mL — AB (ref <0.031)
Troponin I: 0.06 ng/mL — ABNORMAL HIGH (ref <0.031)

## 2015-05-05 LAB — PROTIME-INR
INR: 1.06 (ref 0.00–1.49)
Prothrombin Time: 14 seconds (ref 11.6–15.2)

## 2015-05-05 LAB — MRSA PCR SCREENING: MRSA by PCR: NEGATIVE

## 2015-05-05 LAB — URINE MICROSCOPIC-ADD ON

## 2015-05-05 LAB — CBG MONITORING, ED: GLUCOSE-CAPILLARY: 98 mg/dL (ref 65–99)

## 2015-05-05 SURGERY — CRANIOTOMY HEMATOMA EVACUATION SUBDURAL
Anesthesia: General | Site: Head | Laterality: Left

## 2015-05-05 SURGERY — CRANIOTOMY HEMATOMA EVACUATION SUBDURAL
Anesthesia: General | Laterality: Left

## 2015-05-05 MED ORDER — EPHEDRINE SULFATE 50 MG/ML IJ SOLN
INTRAMUSCULAR | Status: DC | PRN
  Administered 2015-05-05: 5 mg via INTRAVENOUS

## 2015-05-05 MED ORDER — DEXAMETHASONE SODIUM PHOSPHATE 4 MG/ML IJ SOLN
4.0000 mg | Freq: Three times a day (TID) | INTRAMUSCULAR | Status: DC
  Administered 2015-05-07 – 2015-05-08 (×2): 4 mg via INTRAVENOUS
  Filled 2015-05-05 (×2): qty 1

## 2015-05-05 MED ORDER — HYDROCODONE-ACETAMINOPHEN 5-325 MG PO TABS
1.0000 | ORAL_TABLET | ORAL | Status: DC | PRN
  Administered 2015-05-06 (×2): 1 via ORAL
  Filled 2015-05-05 (×3): qty 1

## 2015-05-05 MED ORDER — ONDANSETRON HCL 4 MG PO TABS: 4.0000 mg | ORAL_TABLET | ORAL | Status: DC | PRN

## 2015-05-05 MED ORDER — FUROSEMIDE 40 MG PO TABS
40.0000 mg | ORAL_TABLET | Freq: Two times a day (BID) | ORAL | Status: DC
  Administered 2015-05-06 – 2015-05-09 (×7): 40 mg via ORAL
  Filled 2015-05-05 (×7): qty 1

## 2015-05-05 MED ORDER — LABETALOL HCL 5 MG/ML IV SOLN: 10.0000 mg | INTRAVENOUS | Status: DC | PRN

## 2015-05-05 MED ORDER — TIOTROPIUM BROMIDE MONOHYDRATE 18 MCG IN CAPS
18.0000 ug | ORAL_CAPSULE | Freq: Every day | RESPIRATORY_TRACT | Status: DC
  Administered 2015-05-08 – 2015-05-09 (×2): 18 ug via RESPIRATORY_TRACT
  Filled 2015-05-05: qty 5

## 2015-05-05 MED ORDER — METOPROLOL TARTRATE 25 MG PO TABS
25.0000 mg | ORAL_TABLET | Freq: Two times a day (BID) | ORAL | Status: DC
  Administered 2015-05-05 – 2015-05-09 (×8): 25 mg via ORAL
  Filled 2015-05-05 (×8): qty 1

## 2015-05-05 MED ORDER — SODIUM CHLORIDE 0.9 % IV SOLN
2000.0000 ug | INTRAVENOUS | Status: DC | PRN
  Administered 2015-05-05: .1 ug/kg/min via INTRAVENOUS

## 2015-05-05 MED ORDER — SODIUM CHLORIDE 0.9 % IV SOLN
500.0000 mg | Freq: Two times a day (BID) | INTRAVENOUS | Status: DC
  Administered 2015-05-05 – 2015-05-08 (×6): 500 mg via INTRAVENOUS
  Filled 2015-05-05 (×7): qty 5

## 2015-05-05 MED ORDER — FOLIC ACID 1 MG PO TABS
1.0000 mg | ORAL_TABLET | Freq: Every day | ORAL | Status: DC
  Administered 2015-05-06 – 2015-05-09 (×4): 1 mg via ORAL
  Filled 2015-05-05 (×5): qty 1

## 2015-05-05 MED ORDER — PHENYLEPHRINE HCL 10 MG/ML IJ SOLN
10.0000 mg | INTRAVENOUS | Status: DC | PRN
  Administered 2015-05-05: 20 ug/min via INTRAVENOUS

## 2015-05-05 MED ORDER — ROCURONIUM BROMIDE 50 MG/5ML IV SOLN
INTRAVENOUS | Status: AC
  Filled 2015-05-05: qty 1

## 2015-05-05 MED ORDER — ONDANSETRON HCL 4 MG/2ML IJ SOLN: 4.0000 mg | INTRAMUSCULAR | Status: DC | PRN

## 2015-05-05 MED ORDER — DEXAMETHASONE SODIUM PHOSPHATE 4 MG/ML IJ SOLN
4.0000 mg | Freq: Four times a day (QID) | INTRAMUSCULAR | Status: AC
  Administered 2015-05-07 (×4): 4 mg via INTRAVENOUS
  Filled 2015-05-05 (×4): qty 1

## 2015-05-05 MED ORDER — ESMOLOL HCL 100 MG/10ML IV SOLN
INTRAVENOUS | Status: DC | PRN
  Administered 2015-05-05: 30 mg via INTRAVENOUS

## 2015-05-05 MED ORDER — PROMETHAZINE HCL 25 MG PO TABS: 12.5000 mg | ORAL_TABLET | ORAL | Status: DC | PRN

## 2015-05-05 MED ORDER — FENTANYL CITRATE (PF) 250 MCG/5ML IJ SOLN
INTRAMUSCULAR | Status: AC
  Filled 2015-05-05: qty 5

## 2015-05-05 MED ORDER — LIDOCAINE HCL (CARDIAC) 20 MG/ML IV SOLN
INTRAVENOUS | Status: DC | PRN
  Administered 2015-05-05: 50 mg via INTRAVENOUS

## 2015-05-05 MED ORDER — LISINOPRIL 10 MG PO TABS
10.0000 mg | ORAL_TABLET | Freq: Every day | ORAL | Status: DC
  Administered 2015-05-06 – 2015-05-09 (×4): 10 mg via ORAL
  Filled 2015-05-05 (×4): qty 1

## 2015-05-05 MED ORDER — PHENYLEPHRINE HCL 10 MG/ML IJ SOLN
INTRAMUSCULAR | Status: DC | PRN
  Administered 2015-05-05: 120 ug via INTRAVENOUS

## 2015-05-05 MED ORDER — POTASSIUM CHLORIDE IN NACL 20-0.9 MEQ/L-% IV SOLN
INTRAVENOUS | Status: DC
  Administered 2015-05-05 – 2015-05-07 (×4): via INTRAVENOUS
  Filled 2015-05-05 (×8): qty 1000

## 2015-05-05 MED ORDER — SUCCINYLCHOLINE CHLORIDE 20 MG/ML IJ SOLN
INTRAMUSCULAR | Status: AC
  Filled 2015-05-05: qty 1

## 2015-05-05 MED ORDER — REMIFENTANIL HCL 1 MG IV SOLR
0.0125 ug/kg/min | INTRAVENOUS | Status: DC
  Filled 2015-05-05: qty 2000

## 2015-05-05 MED ORDER — PANTOPRAZOLE SODIUM 40 MG IV SOLR
40.0000 mg | Freq: Every day | INTRAVENOUS | Status: DC
  Administered 2015-05-05 – 2015-05-06 (×2): 40 mg via INTRAVENOUS
  Filled 2015-05-05 (×2): qty 40

## 2015-05-05 MED ORDER — BACITRACIN 50000 UNITS IM SOLR
INTRAMUSCULAR | Status: DC | PRN
  Administered 2015-05-05: 500 mL

## 2015-05-05 MED ORDER — PHENYLEPHRINE 40 MCG/ML (10ML) SYRINGE FOR IV PUSH (FOR BLOOD PRESSURE SUPPORT)
PREFILLED_SYRINGE | INTRAVENOUS | Status: AC
  Filled 2015-05-05: qty 10

## 2015-05-05 MED ORDER — MORPHINE SULFATE (PF) 2 MG/ML IV SOLN
1.0000 mg | INTRAVENOUS | Status: DC | PRN
  Administered 2015-05-06: 2 mg via INTRAVENOUS
  Filled 2015-05-05: qty 1

## 2015-05-05 MED ORDER — SUGAMMADEX SODIUM 500 MG/5ML IV SOLN
INTRAVENOUS | Status: AC
  Filled 2015-05-05: qty 5

## 2015-05-05 MED ORDER — VANCOMYCIN HCL IN DEXTROSE 1-5 GM/200ML-% IV SOLN
INTRAVENOUS | Status: AC
  Administered 2015-05-05: 1000 mg via INTRAVENOUS
  Filled 2015-05-05: qty 200

## 2015-05-05 MED ORDER — LIDOCAINE-EPINEPHRINE 1 %-1:100000 IJ SOLN
INTRAMUSCULAR | Status: DC | PRN
  Administered 2015-05-05: 10 mL via INTRADERMAL

## 2015-05-05 MED ORDER — VANCOMYCIN HCL IN DEXTROSE 1-5 GM/200ML-% IV SOLN
1000.0000 mg | Freq: Two times a day (BID) | INTRAVENOUS | Status: AC
  Administered 2015-05-06 (×2): 1000 mg via INTRAVENOUS
  Filled 2015-05-05 (×2): qty 200

## 2015-05-05 MED ORDER — 0.9 % SODIUM CHLORIDE (POUR BTL) OPTIME
TOPICAL | Status: DC | PRN
  Administered 2015-05-05 (×2): 1000 mL

## 2015-05-05 MED ORDER — THROMBIN 5000 UNITS EX SOLR
OROMUCOSAL | Status: DC | PRN
  Administered 2015-05-05: 5 mL via TOPICAL

## 2015-05-05 MED ORDER — SODIUM CHLORIDE 0.9 % IV SOLN
INTRAVENOUS | Status: DC | PRN
  Administered 2015-05-05: 19:00:00 via INTRAVENOUS

## 2015-05-05 MED ORDER — ALBUTEROL SULFATE (2.5 MG/3ML) 0.083% IN NEBU: 3.0000 mL | INHALATION_SOLUTION | Freq: Four times a day (QID) | RESPIRATORY_TRACT | Status: DC | PRN

## 2015-05-05 MED ORDER — DEXAMETHASONE SODIUM PHOSPHATE 10 MG/ML IJ SOLN
6.0000 mg | Freq: Four times a day (QID) | INTRAMUSCULAR | Status: AC
  Administered 2015-05-05 – 2015-05-06 (×4): 6 mg via INTRAVENOUS
  Filled 2015-05-05 (×4): qty 1

## 2015-05-05 MED ORDER — ETOMIDATE 2 MG/ML IV SOLN
INTRAVENOUS | Status: DC | PRN
  Administered 2015-05-05: 13 mg via INTRAVENOUS

## 2015-05-05 MED ORDER — PROPOFOL 10 MG/ML IV BOLUS
INTRAVENOUS | Status: AC
  Filled 2015-05-05: qty 20

## 2015-05-05 MED ORDER — ACETAMINOPHEN 325 MG PO TABS: 650.0000 mg | ORAL_TABLET | ORAL | Status: DC | PRN

## 2015-05-05 MED ORDER — ROCURONIUM BROMIDE 100 MG/10ML IV SOLN
INTRAVENOUS | Status: DC | PRN
  Administered 2015-05-05 (×2): 10 mg via INTRAVENOUS
  Administered 2015-05-05: 40 mg via INTRAVENOUS

## 2015-05-05 MED ORDER — FENTANYL CITRATE (PF) 100 MCG/2ML IJ SOLN: 25.0000 ug | INTRAMUSCULAR | Status: DC | PRN

## 2015-05-05 MED ORDER — SUGAMMADEX SODIUM 200 MG/2ML IV SOLN
INTRAVENOUS | Status: DC | PRN
  Administered 2015-05-05: 136 mg via INTRAVENOUS

## 2015-05-05 MED ORDER — LIDOCAINE HCL (CARDIAC) 20 MG/ML IV SOLN
INTRAVENOUS | Status: AC
  Filled 2015-05-05: qty 5

## 2015-05-05 MED ORDER — IPRATROPIUM-ALBUTEROL 0.5-2.5 (3) MG/3ML IN SOLN
3.0000 mL | Freq: Four times a day (QID) | RESPIRATORY_TRACT | Status: DC
  Administered 2015-05-05 – 2015-05-07 (×8): 3 mL via RESPIRATORY_TRACT
  Filled 2015-05-05 (×8): qty 3

## 2015-05-05 MED ORDER — ACETAMINOPHEN 650 MG RE SUPP: 650.0000 mg | RECTAL | Status: DC | PRN

## 2015-05-05 MED ORDER — THROMBIN 20000 UNITS EX SOLR
CUTANEOUS | Status: DC | PRN
  Administered 2015-05-05: 20 mL via TOPICAL

## 2015-05-05 MED ORDER — GLYCOPYRROLATE 0.2 MG/ML IJ SOLN
INTRAMUSCULAR | Status: AC
  Filled 2015-05-05: qty 1

## 2015-05-05 MED ORDER — PROMETHAZINE HCL 25 MG/ML IJ SOLN: 6.2500 mg | INTRAMUSCULAR | Status: DC | PRN

## 2015-05-05 SURGICAL SUPPLY — 59 items
BAG DECANTER FOR FLEXI CONT (MISCELLANEOUS) ×3 IMPLANT
BUR SPIRAL ROUTER 2.3 (BUR) ×2 IMPLANT
BUR SPIRAL ROUTER 2.3MM (BUR) ×1
CANISTER SUCT 3000ML PPV (MISCELLANEOUS) ×3 IMPLANT
CLIP TI MEDIUM 6 (CLIP) IMPLANT
DRAPE MICROSCOPE LEICA (MISCELLANEOUS) IMPLANT
DRAPE NEUROLOGICAL W/INCISE (DRAPES) ×3 IMPLANT
DRAPE SURG 17X23 STRL (DRAPES) IMPLANT
DRAPE WARM FLUID 44X44 (DRAPE) ×3 IMPLANT
DURAPREP 6ML APPLICATOR 50/CS (WOUND CARE) ×3 IMPLANT
ELECT CAUTERY BLADE 6.4 (BLADE) ×3 IMPLANT
ELECT REM PT RETURN 9FT ADLT (ELECTROSURGICAL) ×3
ELECTRODE REM PT RTRN 9FT ADLT (ELECTROSURGICAL) ×1 IMPLANT
EVACUATOR 1/8 PVC DRAIN (DRAIN) IMPLANT
GAUZE SPONGE 4X4 12PLY STRL (GAUZE/BANDAGES/DRESSINGS) ×3 IMPLANT
GAUZE SPONGE 4X4 16PLY XRAY LF (GAUZE/BANDAGES/DRESSINGS) IMPLANT
GLOVE BIO SURGEON STRL SZ 6.5 (GLOVE) ×4 IMPLANT
GLOVE BIO SURGEON STRL SZ7 (GLOVE) ×3 IMPLANT
GLOVE BIO SURGEON STRL SZ8 (GLOVE) ×9 IMPLANT
GLOVE BIO SURGEONS STRL SZ 6.5 (GLOVE) ×2
GLOVE BIOGEL PI IND STRL 6.5 (GLOVE) ×1 IMPLANT
GLOVE BIOGEL PI INDICATOR 6.5 (GLOVE) ×2
GOWN STRL REUS W/ TWL LRG LVL3 (GOWN DISPOSABLE) IMPLANT
GOWN STRL REUS W/ TWL XL LVL3 (GOWN DISPOSABLE) IMPLANT
GOWN STRL REUS W/TWL 2XL LVL3 (GOWN DISPOSABLE) ×3 IMPLANT
GOWN STRL REUS W/TWL LRG LVL3 (GOWN DISPOSABLE)
GOWN STRL REUS W/TWL XL LVL3 (GOWN DISPOSABLE)
HEMOSTAT POWDER KIT SURGIFOAM (HEMOSTASIS) IMPLANT
KIT BASIN OR (CUSTOM PROCEDURE TRAY) ×3 IMPLANT
KIT DRAIN CSF ACCUDRAIN (MISCELLANEOUS) ×3 IMPLANT
KIT ROOM TURNOVER OR (KITS) ×3 IMPLANT
NEEDLE HYPO 22GX1.5 SAFETY (NEEDLE) ×3 IMPLANT
NS IRRIG 1000ML POUR BTL (IV SOLUTION) ×3 IMPLANT
PACK CRANIOTOMY (CUSTOM PROCEDURE TRAY) ×3 IMPLANT
PAD ARMBOARD 7.5X6 YLW CONV (MISCELLANEOUS) ×3 IMPLANT
PATTIES SURGICAL .25X.25 (GAUZE/BANDAGES/DRESSINGS) IMPLANT
PATTIES SURGICAL .5 X.5 (GAUZE/BANDAGES/DRESSINGS) IMPLANT
PATTIES SURGICAL .5 X3 (DISPOSABLE) IMPLANT
PATTIES SURGICAL 1X1 (DISPOSABLE) IMPLANT
PERFORATOR LRG  14-11MM (BIT) ×2
PERFORATOR LRG 14-11MM (BIT) ×1 IMPLANT
PIN MAYFIELD SKULL DISP (PIN) IMPLANT
PLATE 1.5  2HOLE LNG NEURO (Plate) ×6 IMPLANT
PLATE 1.5 2HOLE LNG NEURO (Plate) ×3 IMPLANT
RUBBERBAND STERILE (MISCELLANEOUS) IMPLANT
SCREW SELF DRILL HT 1.5/4MM (Screw) ×18 IMPLANT
SPONGE NEURO XRAY DETECT 1X3 (DISPOSABLE) IMPLANT
SPONGE SURGIFOAM ABS GEL 100 (HEMOSTASIS) ×3 IMPLANT
STAPLER VISISTAT 35W (STAPLE) ×3 IMPLANT
SUT ETHILON 3 0 FSL (SUTURE) IMPLANT
SUT NURALON 4 0 TR CR/8 (SUTURE) ×6 IMPLANT
SUT VIC AB 2-0 CP2 18 (SUTURE) ×3 IMPLANT
SYR CONTROL 10ML LL (SYRINGE) ×3 IMPLANT
TAPE CLOTH SURG 4X10 WHT LF (GAUZE/BANDAGES/DRESSINGS) ×3 IMPLANT
TOWEL OR 17X24 6PK STRL BLUE (TOWEL DISPOSABLE) ×3 IMPLANT
TOWEL OR 17X26 10 PK STRL BLUE (TOWEL DISPOSABLE) ×3 IMPLANT
TRAY FOLEY W/METER SILVER 14FR (SET/KITS/TRAYS/PACK) IMPLANT
UNDERPAD 30X30 INCONTINENT (UNDERPADS AND DIAPERS) IMPLANT
WATER STERILE IRR 1000ML POUR (IV SOLUTION) ×3 IMPLANT

## 2015-05-05 NOTE — ED Provider Notes (Signed)
CSN: [id number]     Arrival date & time 05/05/15  1354 History   First MD Initiated Contact with Patient 05/05/15 1525     Chief Complaint  Patient presents with  . Weakness      HPI  Expand All Collapse All   Pt bought in by friends for and increasing in confusion and ability to use in right arm. Pt is alert but unable to answer questions pt is not finishing sentences when asked a question. pts right hand appears contracted and pt unable to hold it up against gravity. Per friend hand has been like that for 2 weeks now but getting worse. Drift also noticed in right leg.        Past Medical History  Diagnosis Date  . Myocardial infarction (HCC)   . Musculoskeletal chest pain   . Fatigue   . SOB (shortness of breath)   . Arthritis   . Anxiety   . Depression   . Syncope   . Ischemic cardiomyopathy   . COPD (chronic obstructive pulmonary disease) (HCC)   . Hypertension   . Tobacco abuse   . Alcohol abuse   . CAD (coronary artery disease)   . Anemia   . Emphysema of lung (HCC)   . CHF (congestive heart failure) (HCC)   . Chronic systolic CHF (congestive heart failure) (HCC) 11/06/2014   Past Surgical History  Procedure Laterality Date  . Coronary artery bypass graft  2011    4v CABG [facility] in [place] in 06/2009 with LIMA to LAD, RIMA to distal RCA, sequential SVG to OM1/OM 2  . Left ventricular aneurysm repair     Family History  Problem Relation Age of Onset  . Family history unknown: Yes   Social History  Substance Use Topics  . Smoking status: Current Every Day Smoker -- 0.25 packs/day for 42 years    Types: Cigarettes  . Smokeless tobacco: Never Used     Comment: working on it  . Alcohol Use: 12.0 - 14.4 oz/week    20-24 Cans of beer per week     Comment: 5-6 beers per day    Review of Systems  Unable to perform ROS: Mental status change      Allergies  Penicillins  Home Medications   Prior to Admission medications   Medication Sig Start Date End  Date Taking? Authorizing Provider  albuterol (PROVENTIL HFA;VENTOLIN HFA) 108 (90 BASE) MCG/ACT inhaler Inhale 2 puffs into the lungs every 6 (six) hours as needed for shortness of breath.    Yes Historical Provider, MD  aspirin 81 MG tablet Take 81 mg by mouth daily.   Yes Historical Provider, MD  folic acid (FOLVITE) 1 MG tablet Take 1 tablet (1 mg total) by mouth daily. 03/08/15  Yes Peter M [place], MD  furosemide (LASIX) 40 MG tablet Take 1 tablet (40 mg total) by mouth 2 (two) times daily. 02/18/15  Yes Peter M [place], MD  ipratropium-albuterol (DUONEB) 0.5-2.5 (3) MG/3ML SOLN Take 3 mLs by nebulization every 4 (four) hours. Patient taking differently: Take 3 mLs by nebulization every 6 (six) hours.  08/23/14  Yes Leroy Sea, MD  lisinopril (PRINIVIL,ZESTRIL) 10 MG tablet Take 1 tablet (10 mg total) by mouth daily. 11/26/14  Yes Marykay Lex, MD  metoprolol tartrate (LOPRESSOR) 25 MG tablet Take 1 tablet (25 mg total) by mouth 2 (two) times daily. 03/03/15  Yes Peter M [place], MD  tiotropium (SPIRIVA HANDIHALER) 18 MCG inhalation capsule Place  1 capsule (18 mcg total) into inhaler and inhale daily. 08/23/14  Yes Leroy Sea, MD  HYDROcodone-acetaminophen (LORTAB) 10-500 MG per tablet Take 1 tablet by mouth every 6 (six) hours as needed for pain.    Historical Provider, MD  varenicline (CHANTIX CONTINUING MONTH PAK) 1 MG tablet Take 1 tablet (1 mg total) by mouth 2 (two) times daily. Patient not taking: Reported on 03/22/2015 08/05/14   Dorena Bodo, PA-C   BP 95/57 mmHg  Pulse 84  Temp(Src) 97.5 F (36.4 C) (Oral)  Resp 30  Ht  (1.626 m)  Wt 150 lb (68.04 kg)  BMI 25.73 kg/m2  SpO2 100% Physical Exam  Constitutional: He appears well-developed and well-nourished. No distress.  HENT:  Head: Normocephalic and atraumatic.  Eyes: Pupils are equal, round, and reactive to light.  Neck: Normal range of motion.  Cardiovascular: Normal rate and intact distal pulses.    Pulmonary/Chest: No respiratory distress.  Abdominal: Normal appearance. He exhibits no distension.  Musculoskeletal: Normal range of motion.  Neurological: He is alert. No cranial nerve deficit. He exhibits abnormal muscle tone. Coordination and gait abnormal. GCS eye subscore is 4. GCS verbal subscore is 5. GCS motor subscore is 6.  Significant weakness in right upper extremity and some weakness in right lower extremity also  Skin: Skin is warm and dry. No rash noted.  Psychiatric: He has a normal mood and affect. His behavior is normal.  Nursing note and vitals reviewed.   ED Course  Procedures (including critical care time) Labs Review Labs Reviewed  BASIC METABOLIC PANEL - Abnormal; Notable for the following:    Sodium 133 (*)    Chloride 98 (*)    Glucose, Bld 104 (*)    All other components within normal limits  TROPONIN I - Abnormal; Notable for the following:    Troponin I 0.06 (*)    All other components within normal limits  CBC  PROTIME-INR  URINALYSIS, ROUTINE W REFLEX MICROSCOPIC (NOT AT [facility])  TROPONIN I  TROPONIN I  CBG MONITORING, ED  TYPE AND SCREEN    Imaging Review Dg Chest 2 View  05/05/2015  CLINICAL DATA:  Altered mental status. EXAM: CHEST  2 VIEW COMPARISON:  March 02, 2015 ; Sep 29, 2014 FINDINGS: There is stable opacity in the lateral right base consistent with either scarring or a small amount of loculated effusion. Lungs elsewhere are clear. The heart is mildly enlarged with pulmonary vascularity within normal limits. Patient is status post coronary artery bypass grafting. No adenopathy. No bone lesions. IMPRESSION: Scarring versus loculated effusion lateral right base. Lungs elsewhere clear. Stable cardiomegaly. Electronically Signed   By: Bretta Bang III M.D.   On: 05/05/2015 16:24   Ct Head Wo Contrast  05/05/2015  CLINICAL DATA:  Confusion, altered LOC EXAM: CT HEAD WITHOUT CONTRAST TECHNIQUE: Contiguous axial images were obtained from the  base of the skull through the vertex without intravenous contrast. COMPARISON:  03/02/2015 FINDINGS: There is a large subdural left hemispheric chronic hematoma measures at least 13 cm in length by 3.5 cm thickness with superimposed acute subdural hemorrhage and some acute blood products. There is mass effect on the right hemisphere and right lateral ventricle. There is about 1 cm left to right midline shift. There is no intraventricular hemorrhage. No acute cortical infarct. No skull fracture is noted. Paranasal sinuses and mastoid air cells are unremarkable. IMPRESSION: There is a large subdural left hemispheric chronic hematoma measures at least 13 cm in  length by 3.5 cm thickness with superimposed acute subdural hemorrhage and some acute blood products. There is mass effect on the right hemisphere and right lateral ventricle. There is about 1 cm left to right midline shift. There is no intraventricular hemorrhage. These results were called by telephone at the time of interpretation on 05/05/2015 at 4:24 pm to Dr. Lowella Bandy, who verbally acknowledged these results. Electronically Signed   By: Natasha Mead M.D.   On: 05/05/2015 16:25   I have personally reviewed and evaluated these images and lab results as part of my medical decision-making.   EKG Interpretation   Date/Time:  Wednesday May 05 2015 14:19:19 EST Ventricular Rate:  77 PR Interval:  146 QRS Duration: 92 QT Interval:  388 QTC Calculation: 439 R Axis:   130 Text Interpretation:  Sinus rhythm with occasional Premature ventricular  complexes and Premature atrial complexes Possible Left atrial enlargement  Possible Inferior infarct , age undetermined Anterolateral infarct , age  undetermined Abnormal ECG No significant change since last tracing  Confirmed by Krimson Massmann  MD, Courteney Alderete [phone]) on 05/05/2015 3:26:24 PM     CRITICAL CARE Performed by: Nelia Shi Total critical care time: 45 minutes Critical care time was exclusive of  separately billable procedures and treating other patients. Critical care was necessary to treat or prevent imminent or life-threatening deterioration. Critical care was time spent personally by me on the following activities: development of treatment plan with patient and/or surrogate as well as nursing, discussions with consultants, evaluation of patient's response to treatment, examination of patient, obtaining history from patient or surrogate, ordering and performing treatments and interventions, ordering and review of laboratory studies, ordering and review of radiographic studies, pulse oximetry and re-evaluation of patient's condition.   Patient was seen and evaluated by neurosurgery.  Will be admitted to intensive care unit after draining  of subdural. MDM   Final diagnoses:  Subdural hematoma (HCC)  Chronic obstructive pulmonary disease, unspecified COPD type (HCC)  Congestive heart failure, unspecified congestive heart failure chronicity, unspecified congestive heart failure type (HCC)        Nelva Nay, MD 05/05/15 1739

## 2015-05-05 NOTE — Anesthesia Procedure Notes (Addendum)
Procedure Name: Intubation Date/Time: 05/05/2015 6:46 PM Performed by: Adonis Housekeeper Pre-anesthesia Checklist: Emergency Drugs available, Suction available, Patient being monitored and Patient identified Patient Re-evaluated:Patient Re-evaluated prior to inductionOxygen Delivery Method: Circle system utilized Preoxygenation: Pre-oxygenation with 100% oxygen Intubation Type: IV induction Ventilation: Mask ventilation without difficulty and Oral airway inserted - appropriate to patient size Laryngoscope Size: Mac and 3 Grade View: Grade I Tube type: Subglottic suction tube Tube size: 7.5 mm Number of attempts: 1 Airway Equipment and Method: Stylet Placement Confirmation: ETT inserted through vocal cords under direct vision,  positive ETCO2 and breath sounds checked- equal and bilateral Secured at: 22 cm Tube secured with: Tape Dental Injury: Teeth and Oropharynx as per pre-operative assessment    Central Venous Catheter Insertion Performed by: anesthesiologist 05/05/2015 6:10 PM Patient location: Pre-op. Preanesthetic checklist: patient identified, IV checked, site marked, risks and benefits discussed, surgical consent, monitors and equipment checked, pre-op evaluation, timeout performed and anesthesia consent Position: supine Lidocaine 1% used for infiltration Landmarks identified and Seldinger technique used Catheter size: 8 Fr Central line was placed.Double lumen Procedure performed using ultrasound guided technique. Attempts: 1 Following insertion, line sutured, dressing applied and Biopatch. Post procedure assessment: blood return through all ports, free fluid flow and no air. Patient tolerated the procedure well with no immediate complications.

## 2015-05-05 NOTE — Progress Notes (Signed)
ANTIBIOTIC CONSULT NOTE - INITIAL  Pharmacy Consult for Vancomycin Indication: surgical prophylaxis  Allergies  Allergen Reactions  . Penicillins Other (See Comments)    Doesn't remember  Has patient had a PCN reaction causing immediate rash, facial/tongue/throat swelling, SOB or lightheadedness with hypotension: unknown Has patient had a PCN reaction causing severe rash involving mucus membranes or skin necrosis: unknown Has patient had a PCN reaction that required hospitalization unknown Has patient had a PCN reaction occurring within the last 10 years: unknown If all of the above answers are "NO", then may proceed with Cephalosporin use.     Patient Measurements: Height: 5\' 4"  (162.6 cm) Weight: 150 lb (68.04 kg) IBW/kg (Calculated) : 59.2 Adjusted Body Weight:   Vital Signs: Temp: 97.5 F (36.4 C) (12/07 2025) Temp Source: Oral (12/07 1414) BP: 135/96 mmHg (12/07 2028) Pulse Rate: 112 (12/07 2028) Intake/Output from previous day:   Intake/Output from this shift: Total I/O In: 900 [I.V.:900] Out: 520 [Urine:320; Blood:200]  Labs:  Recent Labs  05/05/15 1445  WBC 10.1  HGB 14.0  PLT 283  CREATININE 1.19   Estimated Creatinine Clearance: 48.4 mL/min (by C-G formula based on Cr of 1.19). No results for input(s): VANCOTROUGH, VANCOPEAK, VANCORANDOM, GENTTROUGH, GENTPEAK, GENTRANDOM, TOBRATROUGH, TOBRAPEAK, TOBRARND, AMIKACINPEAK, AMIKACINTROU, AMIKACIN in the last 72 hours.   Microbiology: No results found for this or any previous visit (from the past 720 hour(s)).  Medical History: Past Medical History  Diagnosis Date  . Myocardial infarction (HCC)   . Musculoskeletal chest pain   . Fatigue   . SOB (shortness of breath)   . Arthritis   . Anxiety   . Depression   . Syncope   . Ischemic cardiomyopathy   . COPD (chronic obstructive pulmonary disease) (HCC)   . Hypertension   . Tobacco abuse   . Alcohol abuse   . CAD (coronary artery disease)   .  Anemia   . Emphysema of lung (HCC)   . CHF (congestive heart failure) (HCC)   . Chronic systolic CHF (congestive heart failure) (HCC) 11/06/2014    Medications:  Prescriptions prior to admission  Medication Sig Dispense Refill Last Dose  . albuterol (PROVENTIL HFA;VENTOLIN HFA) 108 (90 BASE) MCG/ACT inhaler Inhale 2 puffs into the lungs every 6 (six) hours as needed for shortness of breath.    over 30 days  . aspirin 81 MG tablet Take 81 mg by mouth daily.   05/05/2015 at Unknown time  . folic acid (FOLVITE) 1 MG tablet Take 1 tablet (1 mg total) by mouth daily. 30 tablet 6 05/05/2015 at Unknown time  . furosemide (LASIX) 40 MG tablet Take 1 tablet (40 mg total) by mouth 2 (two) times daily. 60 tablet 6 05/05/2015 at Unknown time  . ipratropium-albuterol (DUONEB) 0.5-2.5 (3) MG/3ML SOLN Take 3 mLs by nebulization every 4 (four) hours. (Patient taking differently: Take 3 mLs by nebulization every 6 (six) hours. ) 360 mL 1 05/05/2015 at Unknown time  . lisinopril (PRINIVIL,ZESTRIL) 10 MG tablet Take 1 tablet (10 mg total) by mouth daily. 30 tablet 6 05/05/2015 at Unknown time  . metoprolol tartrate (LOPRESSOR) 25 MG tablet Take 1 tablet (25 mg total) by mouth 2 (two) times daily. 60 tablet 6 05/05/2015 at 800  . tiotropium (SPIRIVA HANDIHALER) 18 MCG inhalation capsule Place 1 capsule (18 mcg total) into inhaler and inhale daily. 30 capsule 12 05/05/2015 at Unknown time  . HYDROcodone-acetaminophen (LORTAB) 10-500 MG per tablet Take 1 tablet by mouth every 6 (six)  hours as needed for pain.   Not Taking at Unknown time  . varenicline (CHANTIX CONTINUING MONTH PAK) 1 MG tablet Take 1 tablet (1 mg total) by mouth 2 (two) times daily. (Patient not taking: Reported on 03/22/2015) 60 tablet 2 Not Taking at Unknown time   Scheduled:  . [START ON 05/06/2015] dexamethasone  6 mg Intravenous 4 times per day   Followed by  . [START ON 05/07/2015] dexamethasone  4 mg Intravenous 4 times per day   Followed by  .  [START ON 05/08/2015] dexamethasone  4 mg Intravenous 3 times per day  . folic acid  1 mg Oral Daily  . [START ON 05/06/2015] furosemide  40 mg Oral BID  . ipratropium-albuterol  3 mL Nebulization Q6H  . levETIRAcetam  500 mg Intravenous Q12H  . lisinopril  10 mg Oral Daily  . metoprolol tartrate  25 mg Oral BID  . pantoprazole (PROTONIX) IV  40 mg Intravenous QHS  . tiotropium  18 mcg Inhalation Daily  . [START ON 05/06/2015] vancomycin  1,000 mg Intravenous Q12H   Infusions:  . 0.9 % NaCl with KCl 20 mEq / L     Assessment: 69yo male here for L craniotomy for evacuation of SDH. Pharmacy is consulted to dose vancomycin for surgical prophylaxis. Pt is afebrile, WBC 10.1, sCr 1.19.  Goal of Therapy:  Prevention of infection  Plan:  Vancomycin  IV q12h for 2 doses Monitor clinical course  Arlean Hopping. Newman Pies, PharmD, BCPS Clinical Pharmacist Pager [phone] 05/05/2015,8:57 PM

## 2015-05-05 NOTE — ED Notes (Signed)
Pt bought in by friends for and increasing in confusion and ability to use in right arm. Pt is alert but unable to answer questions pt is not finishing sentences when asked a question. pts right hand appears contracted and pt unable to hold it up against gravity. Per friend hand has been like that for 2 weeks now but getting worse. Drift also noticed in right leg.

## 2015-05-05 NOTE — Anesthesia Postprocedure Evaluation (Signed)
Anesthesia Post Note  Patient: Cory Dennis  Procedure(s) Performed: Procedure(s) (LRB): Left CRANIOTOMY HEMATOMA EVACUATION SUBDURAL (Left)  Patient location during evaluation: PACU Anesthesia Type: General Level of consciousness: awake and alert Pain management: pain level controlled Vital Signs Assessment: post-procedure vital signs reviewed and stable Respiratory status: spontaneous breathing Cardiovascular status: blood pressure returned to baseline Anesthetic complications: no    Last Vitals:  Filed Vitals:   05/05/15 2104 05/05/15 2115  BP: 137/79 123/82  Pulse: 116 115  Temp:  36.9 C  Resp: 22 24    Last Pain: There were no vitals filed for this visit.               Kennieth Rad

## 2015-05-05 NOTE — ED Notes (Addendum)
MD Radford Pax at the bedside.  See MD assessment

## 2015-05-05 NOTE — Transfer of Care (Signed)
Immediate Anesthesia Transfer of Care Note  Patient: Cory Dennis  Procedure(s) Performed: Procedure(s): Left CRANIOTOMY HEMATOMA EVACUATION SUBDURAL (Left)  Patient Location: PACU  Anesthesia Type:General  Level of Consciousness: awake, alert  and patient cooperative  Airway & Oxygen Therapy: Patient Spontanous Breathing and Patient connected to nasal cannula oxygen  Post-op Assessment: Report given to RN and Post -op Vital signs reviewed and stable  Post vital signs: Reviewed and stable  Last Vitals:  Filed Vitals:   05/05/15 1715 05/05/15 1730  BP: 95/57 112/59  Pulse: 84 71  Temp:    Resp: 30 25    Complications: No apparent anesthesia complications

## 2015-05-05 NOTE — H&P (Signed)
Reason for Consult: Left subdural hematoma Referring Physician: EDP  Cory Dennis is an 69 y.o. male.   HPI:  69 year old white male with multiple medical problems who fell in early October and presented to the emergency department where a head CT that was read as normal. Over the last few weeks he has developed progressive weakness on the right-hand side to the point that his right arm is plegic and he has significant aphasia. He presented to the emergency department today with the symptoms. Head CT showed a large left-sided chronic subdural hematoma with shift. Neurosurgical I which was requested.  Past Medical History  Diagnosis Date  . Myocardial infarction (HCC)   . Musculoskeletal chest pain   . Fatigue   . SOB (shortness of breath)   . Arthritis   . Anxiety   . Depression   . Syncope   . Ischemic cardiomyopathy   . COPD (chronic obstructive pulmonary disease) (HCC)   . Hypertension   . Tobacco abuse   . Alcohol abuse   . CAD (coronary artery disease)   . Anemia   . Emphysema of lung (HCC)   . CHF (congestive heart failure) (HCC)   . Chronic systolic CHF (congestive heart failure) (HCC) 11/06/2014    Past Surgical History  Procedure Laterality Date  . Coronary artery bypass graft  2011    4v CABG [facility] in [place] in 06/2009 with LIMA to LAD, RIMA to distal RCA, sequential SVG to OM1/OM 2  . Left ventricular aneurysm repair      Allergies  Allergen Reactions  . Penicillins Other (See Comments)    Doesn't remember  Has patient had a PCN reaction causing immediate rash, facial/tongue/throat swelling, SOB or lightheadedness with hypotension: unknown Has patient had a PCN reaction causing severe rash involving mucus membranes or skin necrosis: unknown Has patient had a PCN reaction that required hospitalization unknown Has patient had a PCN reaction occurring within the last 10 years: unknown If all of the above answers are "NO", then may proceed with  Cephalosporin use.     Social History  Substance Use Topics  . Smoking status: Current Every Day Smoker -- 0.25 packs/day for 42 years    Types: Cigarettes  . Smokeless tobacco: Never Used     Comment: working on it  . Alcohol Use: 12.0 - 14.4 oz/week    20-24 Cans of beer per week     Comment: 5-6 beers per day    Family History  Problem Relation Age of Onset  . Family history unknown: Yes     Review of Systems  Positive ROS: Unable to obtain  All other systems have been reviewed and were otherwise negative with the exception of those mentioned in the HPI and as above.  Objective: Vital signs in last 24 hours: Temp:  [97.5 F (36.4 C)] 97.5 F (36.4 C) (12/07 1414) Pulse Rate:  [73-84] 84 (12/07 1715) Resp:  [16-30] 30 (12/07 1715) BP: (95-160)/(57-147) 95/57 mmHg (12/07 1715) SpO2:  [93 %-100 %] 100 % (12/07 1715) Weight:  [68.04 kg (150 lb)] 68.04 kg (150 lb) (12/07 1414)  General Appearance: Alert, cooperative, no distress, appears stated age Head: Normocephalic, without obvious abnormality, atraumatic Eyes: PERRL, conjunctiva/corneas clear, EOM's intact     Neck: Supple, symmetrical, trachea midline Lungs: respirations unlabored Heart: Regular rate and rhythm Abdomen: Soft   NEUROLOGIC:   Mental status: A&O x4, he follows some simple commands but cannot name objects cannot repeat and has no fluency  Motor Exam - grossly normal, normal tone and bulk on the left but his right arm is plegic Sensory Exam - unable to adequately assess Reflexes: symmetric, no pathologic reflexes, No Hoffman's, No clonus Coordination - grossly normal on the left Gait - not tested Balance - not tested Cranial Nerves: I: smell Not tested  II: visual acuity  OS: NA    OD: NA  II: visual fields Full to confrontation  II: pupils Equal, round, reactive to light  III,VII: ptosis None  III,IV,VI: extraocular muscles  Full ROM  V: mastication Normal  V: facial light touch sensation   Normal  V,VII: corneal reflex  Present  VII: facial muscle function - upper  Normal  VII: facial muscle function - lower Normal  VIII: hearing Not tested  IX: soft palate elevation  Normal  IX,X: gag reflex Present  XI: trapezius strength  5/5  XI: sternocleidomastoid strength 5/5  XI: neck flexion strength  5/5  XII: tongue strength  Normal    Data Review Lab Results  Component Value Date   WBC 10.1 05/05/2015   HGB 14.0 05/05/2015   HCT 40.4 05/05/2015   MCV 92.4 05/05/2015   PLT 283 05/05/2015   Lab Results  Component Value Date   NA 133* 05/05/2015   K 4.4 05/05/2015   CL 98* 05/05/2015   CO2 22 05/05/2015   BUN 16 05/05/2015   CREATININE 1.19 05/05/2015   GLUCOSE 104* 05/05/2015   Lab Results  Component Value Date   INR 1.06 05/05/2015    Radiology: Dg Chest 2 View  05/05/2015  CLINICAL DATA:  Altered mental status. EXAM: CHEST  2 VIEW COMPARISON:  March 02, 2015 ; Sep 29, 2014 FINDINGS: There is stable opacity in the lateral right base consistent with either scarring or a small amount of loculated effusion. Lungs elsewhere are clear. The heart is mildly enlarged with pulmonary vascularity within normal limits. Patient is status post coronary artery bypass grafting. No adenopathy. No bone lesions. IMPRESSION: Scarring versus loculated effusion lateral right base. Lungs elsewhere clear. Stable cardiomegaly. Electronically Signed   By: Bretta Bang III M.D.   On: 05/05/2015 16:24   Ct Head Wo Contrast  05/05/2015  CLINICAL DATA:  Confusion, altered LOC EXAM: CT HEAD WITHOUT CONTRAST TECHNIQUE: Contiguous axial images were obtained from the base of the skull through the vertex without intravenous contrast. COMPARISON:  03/02/2015 FINDINGS: There is a large subdural left hemispheric chronic hematoma measures at least 13 cm in length by 3.5 cm thickness with superimposed acute subdural hemorrhage and some acute blood products. There is mass effect on the right hemisphere  and right lateral ventricle. There is about 1 cm left to right midline shift. There is no intraventricular hemorrhage. No acute cortical infarct. No skull fracture is noted. Paranasal sinuses and mastoid air cells are unremarkable. IMPRESSION: There is a large subdural left hemispheric chronic hematoma measures at least 13 cm in length by 3.5 cm thickness with superimposed acute subdural hemorrhage and some acute blood products. There is mass effect on the right hemisphere and right lateral ventricle. There is about 1 cm left to right midline shift. There is no intraventricular hemorrhage. These results were called by telephone at the time of interpretation on 05/05/2015 at 4:24 pm to Dr. Lowella Bandy, who verbally acknowledged these results. Electronically Signed   By: Natasha Mead M.D.   On: 05/05/2015 16:25     Assessment/Plan: 69 year old gentleman with a large left chronic subdural hematoma with significant shift  and mass effect who comes in with significant right hemiparesis and aphasia. I have recommended left craniotomy for evacuation of the large left chronic subdural hematoma. I have described surgery to the healthcare power of attorney in detail. We have talked about typical outcomes and recovery times. They understand the risks of the surgery include but are not Limited to bleeding, infection, brain injury, stroke, numbness, weakness, loss of speech, loss of vision, and anesthesia risk including DVT pneumonia MI and death. They agree to proceed.   Alydia Gosser S 05/05/2015 5:47 PM

## 2015-05-05 NOTE — ED Notes (Signed)
MD Neurosurgery at the bedside.

## 2015-05-05 NOTE — ED Notes (Signed)
Consent Signed and at the bedside.

## 2015-05-05 NOTE — Op Note (Signed)
05/05/2015  7:54 PM  PATIENT:  Cory Dennis  69 y.o. male  PRE-OPERATIVE DIAGNOSIS:   Left subdural hematoma  POST-OPERATIVE DIAGNOSIS:   same  PROCEDURE:   Left craniotomy for evacuation of subdural hematoma  SURGEON:  Marikay Alar, MD  ASSISTANTS:  none  ANESTHESIA:   General  EBL:  Less than 50 ml  Total I/O In: -  Out: 200 [Blood:200]  BLOOD ADMINISTERED:none  DRAINS:  Subdural drain   SPECIMEN:  No Specimen  INDICATION FOR PROCEDURE:  This patient presented with right hemi-plegia and aphasia.  CT scan showed a large left chronic subdural hematoma with midline shift and mass effect. I recommended craniotomy for evacuation of the subdural hematoma. Patient and his power of attorney understood the risks, benefits, and alternatives and potential outcomes and wished to proceed.  PROCEDURE DETAILS: The patient was taken to the operating room and after induction of adequate generalized endotracheal anesthesia, the head was affixed in a 3 point Mayfield head rest, and turned to the  right to expose the  left frontotemporal parietal region. The head was shaved and then cleaned and then prepped with DuraPrep and draped in the usual sterile fashion. 10 cc of local anesthetic was injected, and a  Left frontal incision was made on the  left of the head. Raney clips were placed to establish hemostasis of the scalp, the muscle was reflected with the scalp flap, to expose the  Left frontoparietal region. A burr hole was placed, and a craniotomy flap was turned utilizing the high-speed, air powered drill. The flap was then placed in bacitracin-containing saline solution, and the dura was opened to expose  Left frontoparietal region. A hematoma was then removed with a combination of irrigation and suction. I continued to irrigate until the irrigant was clear to, and dried any bleeding with bipolar cautery.  I fenestrated a large membrane. I then placed a subdural drain through separate stab  incision and close the dura with a running 4-0 Nurolon suture. Dural tack up sutures were placed. The dura was lined with Gelfoam, and the craniotomy flap was replaced with doggie-bone plates. The wound was copiously irrigated.  the galea was then closed with interrupted 2-0 Vicryl suture. The skin was then closed with staples a sterile dressing was applied. The patient was then taken out of the 3-point Mayfield headrest and awakened from general anesthesia, and transported to the recovery room in  stable condition. At the end of the procedure all sponge, needle, and instrument counts were correct.   PLAN OF CARE: Admit to inpatient   PATIENT DISPOSITION:  PACU - hemodynamically stable.   Delay start of Pharmacological VTE agent (>24hrs) due to surgical blood loss or risk of bleeding:  yes

## 2015-05-05 NOTE — Anesthesia Preprocedure Evaluation (Addendum)
Anesthesia Evaluation  Patient identified by MRN, date of birth, ID band Patient confused    Reviewed: Unable to perform ROS - Chart review only  Airway Mallampati: II  TM Distance: >3 FB     Dental   Pulmonary Current Smoker,    breath sounds clear to auscultation       Cardiovascular hypertension, + CAD, + Past MI and +CHF   Rhythm:Regular Rate:Normal     Neuro/Psych Large subdural hematoma c shift    GI/Hepatic   Endo/Other    Renal/GU      Musculoskeletal   Abdominal   Peds  Hematology   Anesthesia Other Findings   Reproductive/Obstetrics                           Lab Results  Component Value Date   WBC 10.1 05/05/2015   HGB 14.0 05/05/2015   HCT 40.4 05/05/2015   MCV 92.4 05/05/2015   PLT 283 05/05/2015   Lab Results  Component Value Date   CREATININE 1.19 05/05/2015   BUN 16 05/05/2015   NA 133* 05/05/2015   K 4.4 05/05/2015   CL 98* 05/05/2015   CO2 22 05/05/2015    Anesthesia Physical Anesthesia Plan  ASA: IV and emergent  Anesthesia Plan: General   Post-op Pain Management:    Induction: Intravenous  Airway Management Planned: Oral ETT  Additional Equipment: Arterial line and CVP  Intra-op Plan:   Post-operative Plan: Possible Post-op intubation/ventilation  Informed Consent: I have reviewed the patients History and Physical, chart, labs and discussed the procedure including the risks, benefits and alternatives for the proposed anesthesia with the patient or authorized representative who has indicated his/her understanding and acceptance.     Plan Discussed with: CRNA and Surgeon  Anesthesia Plan Comments:        Anesthesia Quick Evaluation

## 2015-05-05 NOTE — ED Notes (Signed)
Lab added troponin to blood collected.

## 2015-05-06 ENCOUNTER — Encounter (HOSPITAL_COMMUNITY): Payer: Self-pay | Admitting: Neurological Surgery

## 2015-05-06 ENCOUNTER — Inpatient Hospital Stay (HOSPITAL_COMMUNITY): Payer: Medicare Other

## 2015-05-06 ENCOUNTER — Ambulatory Visit: Payer: Medicare Other | Admitting: Cardiology

## 2015-05-06 LAB — TROPONIN I: TROPONIN I: 0.07 ng/mL — AB (ref <0.031)

## 2015-05-06 MED ORDER — CETYLPYRIDINIUM CHLORIDE 0.05 % MT LIQD: 7.0000 mL | Freq: Two times a day (BID) | OROMUCOSAL | Status: DC

## 2015-05-06 MED ORDER — PNEUMOCOCCAL VAC POLYVALENT 25 MCG/0.5ML IJ INJ
0.5000 mL | INJECTION | INTRAMUSCULAR | Status: AC
  Administered 2015-05-07: 0.5 mL via INTRAMUSCULAR
  Filled 2015-05-06: qty 0.5

## 2015-05-06 MED ORDER — CHLORHEXIDINE GLUCONATE 0.12 % MT SOLN: 15.0000 mL | Freq: Two times a day (BID) | OROMUCOSAL | Status: DC

## 2015-05-06 MED ORDER — CETYLPYRIDINIUM CHLORIDE 0.05 % MT LIQD
7.0000 mL | Freq: Two times a day (BID) | OROMUCOSAL | Status: DC
  Administered 2015-05-06 – 2015-05-09 (×8): 7 mL via OROMUCOSAL

## 2015-05-06 NOTE — Progress Notes (Signed)
Patient ID: Cory Dennis, male   DOB: May 09, 1946, 69 y.o.   MRN: [id number] Subjective: Patient reports he's much better.   Objective: Vital signs in last 24 hours: Temp:  [97.5 F (36.4 C)-98.5 F (36.9 C)] 98.3 F (36.8 C) (12/08 0400) Pulse Rate:  [58-116] 58 (12/08 0700) Resp:  [15-30] 19 (12/08 0700) BP: (92-160)/(53-147) 123/67 mmHg (12/08 0700) SpO2:  [93 %-100 %] 100 % (12/08 0700) Arterial Line BP: (100-134)/(68-95) 105/91 mmHg (12/08 0500) Weight:  [64.7 kg (142 lb 10.2 oz)-68.04 kg (150 lb)] 64.7 kg (142 lb 10.2 oz) (12/07 2043)  Intake/Output from previous day: 12/07 0701 - 12/08 0700 In: 1886.3 [I.V.:1581.3; IV Piggyback:305] Out: 934 [Urine:645; Drains:89; Blood:200] Intake/Output this shift:    Neurologic: Grossly normal,   Lab Results: Lab Results  Component Value Date   WBC 10.1 05/05/2015   HGB 14.0 05/05/2015   HCT 40.4 05/05/2015   MCV 92.4 05/05/2015   PLT 283 05/05/2015   Lab Results  Component Value Date   INR 1.06 05/05/2015   BMET Lab Results  Component Value Date   NA 133* 05/05/2015   K 4.4 05/05/2015   CL 98* 05/05/2015   CO2 22 05/05/2015   GLUCOSE 104* 05/05/2015   BUN 16 05/05/2015   CREATININE 1.19 05/05/2015   CALCIUM 9.1 05/05/2015    Studies/Results: Dg Chest 2 View  05/05/2015  CLINICAL DATA:  Altered mental status. EXAM: CHEST  2 VIEW COMPARISON:  March 02, 2015 ; Sep 29, 2014 FINDINGS: There is stable opacity in the lateral right base consistent with either scarring or a small amount of loculated effusion. Lungs elsewhere are clear. The heart is mildly enlarged with pulmonary vascularity within normal limits. Patient is status post coronary artery bypass grafting. No adenopathy. No bone lesions. IMPRESSION: Scarring versus loculated effusion lateral right base. Lungs elsewhere clear. Stable cardiomegaly. Electronically Signed   By: Bretta Bang III M.D.   On: 05/05/2015 16:24   Ct Head Wo Contrast  05/05/2015   CLINICAL DATA:  Confusion, altered LOC EXAM: CT HEAD WITHOUT CONTRAST TECHNIQUE: Contiguous axial images were obtained from the base of the skull through the vertex without intravenous contrast. COMPARISON:  03/02/2015 FINDINGS: There is a large subdural left hemispheric chronic hematoma measures at least 13 cm in length by 3.5 cm thickness with superimposed acute subdural hemorrhage and some acute blood products. There is mass effect on the right hemisphere and right lateral ventricle. There is about 1 cm left to right midline shift. There is no intraventricular hemorrhage. No acute cortical infarct. No skull fracture is noted. Paranasal sinuses and mastoid air cells are unremarkable. IMPRESSION: There is a large subdural left hemispheric chronic hematoma measures at least 13 cm in length by 3.5 cm thickness with superimposed acute subdural hemorrhage and some acute blood products. There is mass effect on the right hemisphere and right lateral ventricle. There is about 1 cm left to right midline shift. There is no intraventricular hemorrhage. These results were called by telephone at the time of interpretation on 05/05/2015 at 4:24 pm to Dr. Lowella Bandy, who verbally acknowledged these results. Electronically Signed   By: Natasha Mead M.D.   On: 05/05/2015 16:25    Assessment/Plan: This patient is doing much better after craniotomy. His piece deficits of improved greatly and he is now moving his right arm with antigravity strength at least. Awaiting follow-up CT. Mobilize today   LOS: 1 day    Ignace Mandigo S 05/06/2015, 7:29 AM

## 2015-05-06 NOTE — Care Management Note (Signed)
Case Management Note  Patient Details  Name: CAP WERNICKE MRN: [id number] Date of Birth: 05-Jan-1946  Subjective/Objective:    Pt admitted on 05/05/15 with SDH requiring craniotomy.  PTA, pt independent, lives alone.                Action/Plan: Will follow for discharge planning as pt progresses.    Expected Discharge Date:                  Expected Discharge Plan:     In-House Referral:     Discharge planning Services     Post Acute Care Choice:    Choice offered to:     DME Arranged:    DME Agency:     HH Arranged:    HH Agency:     Status of Service:     Medicare Important Message Given:    Date Medicare IM Given:    Medicare IM give by:    Date Additional Medicare IM Given:    Additional Medicare Important Message give by:     If discussed at Long Length of Stay Meetings, dates discussed:    Additional Comments:  Quintella Baton, RN, BSN  Trauma/Neuro ICU Case Manager [phone]

## 2015-05-06 NOTE — Evaluation (Signed)
Occupational Therapy Evaluation Patient Details Name: Cory Dennis MRN: [id number] DOB: 1945/07/17 Today's Date: 05/06/2015    History of Present Illness 69 year old white male with multiple medical problems who fell in early October ("walking around a truck) and presented to the emergency department where a head CT that was read as normal. Over the last few weeks he has developed progressive weakness on the right-hand side to the point that his right arm is plegic and he has significant aphasia. He presented to the emergency department today with the symptoms. Head CT showed a large left-sided chronic subdural hematoma with shift.. S/P craniotomy 05/05/15   Clinical Impression   This 69 yo male admitted and underwent above presents to acute OT with decreased balance, decreased mobility, decreased safety awareness, and decreased strength RUE all affecting his ability to care for himself independently at home (as he says he was pta). He will benefit from acute OT with follow up OT on CIR.    Follow Up Recommendations  CIR    Equipment Recommendations   (TBD next venue)       Precautions / Restrictions Precautions Precautions: Fall Restrictions Weight Bearing Restrictions: No      Mobility Bed Mobility Overal bed mobility: Needs Assistance Bed Mobility: Supine to Sit     Supine to sit: Min guard;HOB elevated        Transfers Overall transfer level: Needs assistance Equipment used: 2 person hand held assist Transfers: Sit to/from Stand Sit to Stand: Min assist         General transfer comment: Ambulated 15 feet with Mod A +2    Balance Overall balance assessment: Needs assistance Sitting-balance support: No upper extremity supported;Feet supported Sitting balance-Leahy Scale: Good Sitting balance - Comments: to donn socks sitting EOB   Standing balance support: Bilateral upper extremity supported Standing balance-Leahy Scale: Poor Standing balance comment: Bil  HHA from therapist and nurse                            ADL Overall ADL's : Needs assistance/impaired Eating/Feeding: Independent;Sitting   Grooming: Sitting;Set up;Supervision/safety   Upper Body Bathing: Sitting;Set up;Supervision/ safety   Lower Body Bathing: Moderate assistance (with +2 min A sit<>stand)   Upper Body Dressing : Minimal assistance;Sitting   Lower Body Dressing: Minimal assistance (with +2 min A sit<>stand)   Toilet Transfer: Moderate assistance;+2 for physical assistance;Ambulation (Bil HHA, bed around and out door (15 feet))   Toileting- Clothing Manipulation and Hygiene: Moderate assistance (with +2 min A sit<>stand)               Vision Additional Comments: Says he does not wear glasses, 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 (says this is from when [place])          Pertinent Vitals/Pain Pain Assessment: No/denies pain     Hand Dominance Right   Extremity/Trunk Assessment Upper Extremity Assessment Upper Extremity Assessment: RUE deficits/detail RUE Deficits / Details: generally weak with mild proprioceptive difficulties with donning socks RUE Coordination: decreased fine motor           Communication Communication Communication: No difficulties   Cognition Arousal/Alertness: Awake/alert Behavior During Therapy: WFL for tasks assessed/performed Overall Cognitive Status: Impaired/Different from baseline Area of Impairment: Memory;Safety/judgement;Problem solving     Memory: Decreased recall of precautions (not to get up from chair by himself (set off chair alarm not more than 10 minutes after I left  room))   Safety/Judgement: Decreased awareness of safety;Decreased awareness of deficits   Problem Solving: Slow processing;Decreased initiation;Difficulty sequencing;Requires verbal cues General Comments: Took two people to help him ambulate a short distance when asked how his walking compared to normal he  said this was better.              Home Living Family/patient expects to be discharged to:: Private residence Living Arrangements: Alone Available Help at Discharge: Available 24 hours/day (friend and a neighbor across the street) Type of Home: House Home Access: Stairs to enter   Entrance Stairs-Rails: None Home Layout: One level     Bathroom Shower/Tub: Dietitian: None          Prior Functioning/Environment Level of Independence: Independent             OT Diagnosis: Generalized weakness;Cognitive deficits   OT Problem List: Decreased strength;Impaired balance (sitting and/or standing);Decreased cognition;Impaired UE functional use;Decreased safety awareness;Decreased knowledge of use of DME or AE   OT Treatment/Interventions: Self-care/ADL training;Patient/family education;Balance training;Therapeutic activities;DME and/or AE instruction;Cognitive remediation/compensation;Therapeutic exercise    OT Goals(Current goals can be found in the care plan section) Acute Rehab OT Goals Patient Stated Goal: to go home OT Goal Formulation: With patient Time For Goal Achievement: 05/13/15 Potential to Achieve Goals: Good  OT Frequency: Min 3X/week   Barriers to D/C: Decreased caregiver support             End of Session Equipment Utilized During Treatment: Gait belt Nurse Communication:  (nursing helped me with his ambulation)  Activity Tolerance: Patient tolerated treatment well Patient left: in chair;with call bell/phone within reach;with chair alarm set   Time: [phone] OT Time Calculation (min): 29 min Charges:  OT General Charges $OT Visit: 1 Procedure OT Evaluation $Initial OT Evaluation Tier I: 1 Procedure OT Treatments $Self Care/Home Management : 8-22 mins  Evette Georges [phone] 05/06/2015, 1:10 PM

## 2015-05-06 NOTE — Progress Notes (Signed)
Inpatient Rehabilitation  Patient was screened by Cory Dennis for appropriateness for an Inpatient Acute Rehab consult.  At this time we are recommending an Inpatient Rehab consult.  Please order if you are agreeable.    Cory Dennis, M.A., CCC/SLP Admission Coordinator  [facility]  Cell [phone]  

## 2015-05-07 DIAGNOSIS — S065X0A Traumatic subdural hemorrhage without loss of consciousness, initial encounter: Secondary | ICD-10-CM | POA: Diagnosis not present

## 2015-05-07 DIAGNOSIS — S065X0S Traumatic subdural hemorrhage without loss of consciousness, sequela: Secondary | ICD-10-CM

## 2015-05-07 MED ORDER — PANTOPRAZOLE SODIUM 40 MG PO TBEC
40.0000 mg | DELAYED_RELEASE_TABLET | Freq: Every day | ORAL | Status: DC
  Administered 2015-05-07 – 2015-05-08 (×2): 40 mg via ORAL
  Filled 2015-05-07 (×2): qty 1

## 2015-05-07 NOTE — Evaluation (Signed)
Physical Therapy Evaluation Patient Details Name: Cory Dennis MRN: [id number] DOB: Jun 21, 1945 Today's Date: 05/07/2015   History of Present Illness  69 year old white male with multiple medical problems who fell in early October ("walking around a truck) and presented to the emergency department where a head CT that was read as normal. Over the last few weeks he has developed progressive weakness on the right-hand side to the point that his right arm is plegic and he has significant aphasia. He presented to the emergency department today with the symptoms. Head CT showed a large left-sided chronic subdural hematoma with shift.. S/P craniotomy 05/05/15  Clinical Impression  Pt impulsive and with poor awareness of safety and deficits.  Pt needs A at this time for balance and safety with mobility as pt's impulsivity causes increased fall risk.  Pt would benefit from CIR at D/C to maximize independence and decrease fall risk prior to returning to home.  Will continue to follow.      Follow Up Recommendations CIR    Equipment Recommendations  None recommended by PT    Recommendations for Other Services       Precautions / Restrictions Precautions Precautions: Fall Restrictions Weight Bearing Restrictions: No      Mobility  Bed Mobility Overal bed mobility: Needs Assistance Bed Mobility: Supine to Sit     Supine to sit: Supervision;HOB elevated     General bed mobility comments: pt found trying to slide OOB at foot of bed with all 4 bed rails up and bed alarm going off.    Transfers Overall transfer level: Needs assistance Equipment used: 1 person hand held assist Transfers: Sit to/from Stand Sit to Stand: Min assist         General transfer comment: cues for UE use, slowing down, and attending to safety.    Ambulation/Gait Ambulation/Gait assistance: Min assist;+2 safety/equipment Ambulation Distance (Feet): 30 Feet Assistive device: 1 person hand held assist Gait  Pattern/deviations: Step-through pattern;Decreased stride length;Staggering left;Staggering right     General Gait Details: pt with staggered gait and MinA needed for balance.  2nd person present to A with line management and pt safety due to impulsivity.  Stairs            Wheelchair Mobility    Modified Rankin (Stroke Patients Only)       Balance Overall balance assessment: Needs assistance;History of Falls Sitting-balance support: No upper extremity supported;Feet supported Sitting balance-Leahy Scale: Good     Standing balance support: No upper extremity supported;During functional activity Standing balance-Leahy Scale: Poor Standing balance comment: pt needs MinA and occasional use of UE support on furniture.                               Pertinent Vitals/Pain Pain Assessment: No/denies pain    Home Living Family/patient expects to be discharged to:: Private residence Living Arrangements: Alone Available Help at Discharge: Available 24 hours/day (friend and a neighbor across the street) Type of Home: House Home Access: Stairs to enter Entrance Stairs-Rails: None Secretary/administrator of Steps: 4 Home Layout: One level Home Equipment: None      Prior Function Level of Independence: Independent               Hand Dominance   Dominant Hand: Right    Extremity/Trunk Assessment   Upper Extremity Assessment: Defer to OT evaluation           Lower Extremity Assessment:  RLE deficits/detail RLE Deficits / Details: Generally weak and with decreased coordination.      Cervical / Trunk Assessment: Normal  Communication   Communication: No difficulties  Cognition Arousal/Alertness: Awake/alert Behavior During Therapy: Impulsive Overall Cognitive Status: Impaired/Different from baseline Area of Impairment: Memory;Safety/judgement;Problem solving     Memory: Decreased recall of precautions   Safety/Judgement: Decreased awareness of  safety;Decreased awareness of deficits   Problem Solving: Slow processing;Decreased initiation;Difficulty sequencing;Requires verbal cues;Requires tactile cues General Comments: pt impulsive and setting off bed alarm on arrival despite multiple attempts at educating on need for A for mobility from OT and RNs.  pt HR up to 140's during mobility and when PT mentions this pt states it is inaccurate and faulty wiring.      General Comments      Exercises        Assessment/Plan    PT Assessment Patient needs continued PT services  PT Diagnosis Difficulty walking;Generalized weakness;Altered mental status   PT Problem List Decreased strength;Decreased activity tolerance;Decreased balance;Decreased mobility;Decreased coordination;Decreased cognition;Decreased knowledge of use of DME;Decreased safety awareness  PT Treatment Interventions DME instruction;Gait training;Stair training;Functional mobility training;Therapeutic activities;Therapeutic exercise;Balance training;Neuromuscular re-education;Cognitive remediation;Patient/family education   PT Goals (Current goals can be found in the Care Plan section) Acute Rehab PT Goals Patient Stated Goal: to go home PT Goal Formulation: With patient Time For Goal Achievement: 05/21/15 Potential to Achieve Goals: Good    Frequency Min 4X/week   Barriers to discharge        Co-evaluation               End of Session Equipment Utilized During Treatment: Gait belt Activity Tolerance: Patient tolerated treatment well Patient left: in bed;with call bell/phone within reach;with bed alarm set Nurse Communication: Mobility status         Time: 1010-1031 PT Time Calculation (min) (ACUTE ONLY): 21 min   Charges:   PT Evaluation $Initial PT Evaluation Tier I: 1 Procedure     PT G CodesSunny Schlein, [place] [phone] 05/07/2015, 2:25 PM

## 2015-05-07 NOTE — Progress Notes (Signed)
Pt's crani dressing damp w/ moderate amt serous drainage; subdural drain accidentally removed by patient.  Pt assessed, no neuro changes; dressing changed, Dr. Yetta Barre notified, no new orders.

## 2015-05-07 NOTE — Progress Notes (Signed)
Rehab admissions - I met with patient.  He tells me that he does not want inpatient rehab or SNF.  His intention is to discharge directly home once medically stable.  I will follow up again on Monday in case he changes his mind.  Call me for questions.  #[phone]

## 2015-05-07 NOTE — Progress Notes (Signed)
Postop day 2. Patient sitting upright in bed looking good. Denies headache.  Afebrile. Vitals are stable. Awake and alert. Oriented and appropriate. Cranial nerve function intact. Motor 5/5 bilaterally without pronator drift. Dressing clean and dry. Subdural drain removed by patient last night.  Overall doing well. Continue efforts at mobilization. ICU observation for 1 more day.

## 2015-05-07 NOTE — Consult Note (Signed)
Physical Medicine and Rehabilitation Consult Reason for Consult: Traumatic Left subdural hematoma Referring Physician: Dr. Yetta Barre   HPI: Cory Dennis is a 69 y.o. right handed male with history of CAD status post CABG 2011 maintained on aspirin 81 mg daily, chronic systolic congestive heart failure, hypertension as well as tobacco abuse. By report patient lives alone independently prior to admission one level home no steps to entry. Presented 05/05/2015 with progressive weakness on the right side and significant aphasia. Noted fall early October striking his head. Cranial CT scan showed a large left-sided chronic subdural hematoma with shift. Underwent left craniotomy evacuation of subdural hematoma 05/05/2015 per Dr. Marikay Alar. Maintained on Decadron protocol. Keppra added for seizure prophylaxis. Close monitoring of blood pressure. Tolerating a regular consistency diet. Occupational therapy evaluation completed 05/06/2015 with recommendations of physical medicine rehabilitation consult.   Review of Systems  Constitutional: Positive for malaise/fatigue. Negative for chills.  HENT: Negative for hearing loss.   Eyes: Negative for blurred vision and double vision.  Respiratory: Positive for cough.        Shortness of breath with exertion  Cardiovascular: Positive for leg swelling. Negative for chest pain and palpitations.  Gastrointestinal: Positive for constipation. Negative for nausea, vomiting and abdominal pain.  Genitourinary: Positive for frequency. Negative for dysuria and hematuria.  Musculoskeletal: Positive for myalgias and joint pain.       Recent fall early October.  Skin: Negative for rash.  Neurological: Positive for dizziness. Negative for seizures, loss of consciousness and headaches.  Psychiatric/Behavioral: Positive for depression.       Anxiety  All other systems reviewed and are negative.  Past Medical History  Diagnosis Date  . Myocardial infarction (HCC)    . Musculoskeletal chest pain   . Fatigue   . SOB (shortness of breath)   . Arthritis   . Anxiety   . Depression   . Syncope   . Ischemic cardiomyopathy   . COPD (chronic obstructive pulmonary disease) (HCC)   . Hypertension   . Tobacco abuse   . Alcohol abuse   . CAD (coronary artery disease)   . Anemia   . Emphysema of lung (HCC)   . CHF (congestive heart failure) (HCC)   . Chronic systolic CHF (congestive heart failure) (HCC) 11/06/2014   Past Surgical History  Procedure Laterality Date  . Coronary artery bypass graft  2011    4v CABG [facility] in [place] in 06/2009 with LIMA to LAD, RIMA to distal RCA, sequential SVG to OM1/OM 2  . Left ventricular aneurysm repair    . Craniotomy Left 05/05/2015    Procedure: Left CRANIOTOMY HEMATOMA EVACUATION SUBDURAL;  Surgeon: Tia Alert, MD;  Location: [facility];  Service: Neurosurgery;  Laterality: Left;   Family History  Problem Relation Age of Onset  . Family history unknown: Yes   Social History:  reports that he has been smoking Cigarettes.  He has a 10.5 pack-year smoking history. He has never used smokeless tobacco. He reports that he drinks about 12.0 - 14.4 oz of alcohol per week. He reports that he does not use illicit drugs. Allergies:  Allergies  Allergen Reactions  . Penicillins Other (See Comments)    Doesn't remember  Has patient had a PCN reaction causing immediate rash, facial/tongue/throat swelling, SOB or lightheadedness with hypotension: unknown Has patient had a PCN reaction causing severe rash involving mucus membranes or skin necrosis: unknown Has patient had a PCN reaction that required hospitalization  unknown Has patient had a PCN reaction occurring within the last 10 years: unknown If all of the above answers are "NO", then may proceed with Cephalosporin use.    Medications Prior to Admission  Medication Sig Dispense Refill  . albuterol (PROVENTIL HFA;VENTOLIN HFA) 108 (90 BASE) MCG/ACT inhaler  Inhale 2 puffs into the lungs every 6 (six) hours as needed for shortness of breath.     Marland Kitchen aspirin 81 MG tablet Take 81 mg by mouth daily.    . folic acid (FOLVITE) 1 MG tablet Take 1 tablet (1 mg total) by mouth daily. 30 tablet 6  . furosemide (LASIX) 40 MG tablet Take 1 tablet (40 mg total) by mouth 2 (two) times daily. 60 tablet 6  . ipratropium-albuterol (DUONEB) 0.5-2.5 (3) MG/3ML SOLN Take 3 mLs by nebulization every 4 (four) hours. (Patient taking differently: Take 3 mLs by nebulization every 6 (six) hours. ) 360 mL 1  . lisinopril (PRINIVIL,ZESTRIL) 10 MG tablet Take 1 tablet (10 mg total) by mouth daily. 30 tablet 6  . metoprolol tartrate (LOPRESSOR) 25 MG tablet Take 1 tablet (25 mg total) by mouth 2 (two) times daily. 60 tablet 6  . tiotropium (SPIRIVA HANDIHALER) 18 MCG inhalation capsule Place 1 capsule (18 mcg total) into inhaler and inhale daily. 30 capsule 12  . HYDROcodone-acetaminophen (LORTAB) 10-500 MG per tablet Take 1 tablet by mouth every 6 (six) hours as needed for pain.    . varenicline (CHANTIX CONTINUING MONTH PAK) 1 MG tablet Take 1 tablet (1 mg total) by mouth 2 (two) times daily. (Patient not taking: Reported on 03/22/2015) 60 tablet 2    Home: Home Living Family/patient expects to be discharged to:: Private residence Living Arrangements: Alone Available Help at Discharge: Available 24 hours/day (friend and a neighbor across the street) Type of Home: House Home Access: Stairs to enter Entrance Stairs-Rails: None Home Layout: One level Bathroom Shower/Tub: Proofreader: None  Functional History: Prior Function Level of Independence: Independent Functional Status:  Mobility: Bed Mobility Overal bed mobility: Needs Assistance Bed Mobility: Supine to Sit Supine to sit: Min guard, HOB elevated Transfers Overall transfer level: Needs assistance Equipment used: 2 person hand held assist Transfers: Sit to/from Stand Sit to Stand: Min  assist General transfer comment: Ambulated 15 feet with Mod A +2      ADL: ADL Overall ADL's : Needs assistance/impaired Eating/Feeding: Independent, Sitting Grooming: Sitting, Set up, Supervision/safety Upper Body Bathing: Sitting, Set up, Supervision/ safety Lower Body Bathing: Moderate assistance (with +2 min A sit<>stand) Upper Body Dressing : Minimal assistance, Sitting Lower Body Dressing: Minimal assistance (with +2 min A sit<>stand) Toilet Transfer: Moderate assistance, +2 for physical assistance, Ambulation (Bil HHA, bed around and out door (15 feet)) Toileting- Clothing Manipulation and Hygiene: Moderate assistance (with +2 min A sit<>stand)  Cognition: Cognition Overall Cognitive Status: Impaired/Different from baseline Orientation Level: Oriented to person, Oriented to place, Oriented to situation, Disoriented to time (knows month but not year) Cognition Arousal/Alertness: Awake/alert Behavior During Therapy: WFL for tasks assessed/performed Overall Cognitive Status: Impaired/Different from baseline Area of Impairment: Memory, Safety/judgement, Problem solving Memory: Decreased recall of precautions (not to get up from chair by himself (set off chair alarm not more than 10 minutes after I left room)) Safety/Judgement: Decreased awareness of safety, Decreased awareness of deficits Problem Solving: Slow processing, Decreased initiation, Difficulty sequencing, Requires verbal cues General Comments: Took two people to help him ambulate a short distance when asked how his walking compared to normal  he said this was better.  Blood pressure 77/44, pulse 89, temperature 97.6 F (36.4 C), temperature source Oral, resp. rate 17, height 5\' 4"  (1.626 m), weight 64.7 kg (142 lb 10.2 oz), SpO2 94 %. Physical Exam  HENT:  Craniotomy site dressed  Eyes:  Left pupil slow to react to light  Neck: Normal range of motion. Neck supple. No thyromegaly present.  Cardiovascular: Normal  rate and regular rhythm.   Respiratory: Effort normal and breath sounds normal. No respiratory distress.  GI: Soft. Bowel sounds are normal. He exhibits no distension.  Neurological: He is alert.  Provides name age and date of birth. Follows simple commands. He cannot recall full hospital events. Very impulsive. Cooperates with exam but distracted. RUE and RLE 4- to 4/5. LUE and LLE 4 to 4+/5. No gross sensory or CN deficits  Psychiatric:  Restless, anxious    No results found for this or any previous visit (from the past 24 hour(s)). Dg Chest 2 View  05/05/2015  CLINICAL DATA:  Altered mental status. EXAM: CHEST  2 VIEW COMPARISON:  March 02, 2015 ; Sep 29, 2014 FINDINGS: There is stable opacity in the lateral right base consistent with either scarring or a small amount of loculated effusion. Lungs elsewhere are clear. The heart is mildly enlarged with pulmonary vascularity within normal limits. Patient is status post coronary artery bypass grafting. No adenopathy. No bone lesions. IMPRESSION: Scarring versus loculated effusion lateral right base. Lungs elsewhere clear. Stable cardiomegaly. Electronically Signed   By: Bretta Bang III M.D.   On: 05/05/2015 16:24   Ct Head Wo Contrast  05/06/2015  CLINICAL DATA:  69 year old male with left subdural hematoma status post surgery for drainage. Initial encounter. EXAM: CT HEAD WITHOUT CONTRAST TECHNIQUE: Contiguous axial images were obtained from the base of the skull through the vertex without intravenous contrast. COMPARISON:  05/05/2015 and earlier. FINDINGS: Interval left frontal craniotomy. Subdural drain in place on the left. Overlying postoperative changes to the scalp with skin staples and subcutaneous gas. No other No acute osseous abnormality identified. Visible paranasal sinuses remain clear. Minimal left mastoid opacification unchanged. Orbits soft tissues are stable and within normal limits. Calcified atherosclerosis at the skull base.  Regressed and now mostly hypodense left subdural hematoma. This measures 17 mm in thickness laterally (versus 35 mm at the same level previously). Regressed rightward midline shift, now 4 mm (previously 11 mm). Small to moderate volume pneumocephalus. There is a small volume of posterior parafalcine hyperdense subdural blood which is new (series 2, image 14), but might be Re distributed At the same time low-density extra-axial fluid on the right has increased (series 2, image 19). However, there is mild if any right hemisphere mass effect. Basilar cisterns remain patent. Stable gray-white matter differentiation throughout the brain. chronic right cerebellar infarct. No intraventricular hemorrhage or ventriculomegaly. IMPRESSION: 1. Regressed left subdural hematoma following craniotomy and drain placement. Residual 17 mm (down from 35 mm). 2. Decreased rightward midline shift, now 4 mm. Small to moderate volume pneumocephalus. 3. New small volume of hyperdense posterior parafalcine subdural blood. New small right side low-density Extra-axial collection. No significant right hemisphere mass effect. 4. No other new intracranial abnormality. Electronically Signed   By: Odessa Fleming M.D.   On: 05/06/2015 08:49   Ct Head Wo Contrast  05/05/2015  CLINICAL DATA:  Confusion, altered LOC EXAM: CT HEAD WITHOUT CONTRAST TECHNIQUE: Contiguous axial images were obtained from the base of the skull through the vertex without intravenous contrast. COMPARISON:  03/02/2015 FINDINGS: There is a large subdural left hemispheric chronic hematoma measures at least 13 cm in length by 3.5 cm thickness with superimposed acute subdural hemorrhage and some acute blood products. There is mass effect on the right hemisphere and right lateral ventricle. There is about 1 cm left to right midline shift. There is no intraventricular hemorrhage. No acute cortical infarct. No skull fracture is noted. Paranasal sinuses and mastoid air cells are  unremarkable. IMPRESSION: There is a large subdural left hemispheric chronic hematoma measures at least 13 cm in length by 3.5 cm thickness with superimposed acute subdural hemorrhage and some acute blood products. There is mass effect on the right hemisphere and right lateral ventricle. There is about 1 cm left to right midline shift. There is no intraventricular hemorrhage. These results were called by telephone at the time of interpretation on 05/05/2015 at 4:24 pm to Dr. Lowella Bandy, who verbally acknowledged these results. Electronically Signed   By: Natasha Mead M.D.   On: 05/05/2015 16:25    Assessment/Plan: Diagnosis: chronic/subacute left SDH after fall in October, s/p left craniotomy 1. Does the need for close, 24 hr/day medical supervision in concert with the patient's rehab needs make it unreasonable for this patient to be served in a less intensive setting? Yes 2. Co-Morbidities requiring supervision/potential complications: copd, chf, cad, etoh abuse 3. Due to bladder management, bowel management, safety, skin/wound care, disease management, medication administration, pain management and patient education, does the patient require 24 hr/day rehab nursing? Yes 4. Does the patient require coordinated care of a physician, rehab nurse, PT (1-2 hrs/day, 5 days/week), OT (1-2 hrs/day, 5 days/week) and SLP (1-2 hrs/day, 5 days/week) to address physical and functional deficits in the context of the above medical diagnosis(es)? Yes Addressing deficits in the following areas: balance, endurance, locomotion, strength, transferring, bowel/bladder control, bathing, dressing, feeding, grooming, toileting, cognition and psychosocial support 5. Can the patient actively participate in an intensive therapy program of at least 3 hrs of therapy per day at least 5 days per week? Potentially 6. The potential for patient to make measurable gains while on inpatient rehab is good 7. Anticipated functional outcomes upon  discharge from inpatient rehab are modified independent and supervision  with PT, modified independent and supervision with OT, modified independent and supervision with SLP. 8. Estimated rehab length of stay to reach the above functional goals is: 7-12 days 9. Does the patient have adequate social supports and living environment to accommodate these discharge functional goals? No and Potentially 10. Anticipated D/C setting: Home 11. Anticipated post D/C treatments: HH therapy and Outpatient therapy 12. Overall Rehab/Functional Prognosis: excellent  RECOMMENDATIONS: This patient's condition is appropriate for continued rehabilitative care in the following setting: CIR Patient has agreed to participate in recommended program. Potentially Note that insurance prior authorization may be required for reimbursement for recommended care.  Comment: Need to establish social supports. There doesn't appear to be many. Rehab Admissions Coordinator to follow up.  Thanks,  Ranelle Oyster, MD, [place] [facility]     05/07/2015

## 2015-05-07 NOTE — Evaluation (Signed)
Occupational Therapy Evaluation Patient Details Name: Cory Dennis MRN: [id number] DOB: 06-05-1945 Today's Date: 05/07/2015    History of Present Illness 69 year old white male with multiple medical problems who fell in early October ("walking around a truck) and presented to the emergency department where a head CT that was read as normal. Over the last few weeks he has developed progressive weakness on the right-hand side to the point that his right arm is plegic and he has significant aphasia. He presented to the emergency department today with the symptoms. Head CT showed a large left-sided chronic subdural hematoma with shift.. S/P craniotomy 05/05/15   Clinical Impression   Pt continues to demonstrate poor awareness of deficits and safety. Mild incoordination of R UE with reaching and fine motor activities. Standing balance interfering with ability to perform self care independently.  Continue to recommend inpatient rehab if pt is agreeable.  Follow Up Recommendations  CIR    Equipment Recommendations       Recommendations for Other Services       Precautions / Restrictions Precautions Precautions: Fall Restrictions Weight Bearing Restrictions: No      Mobility Bed Mobility Overal bed mobility: Needs Assistance Bed Mobility: Supine to Sit;Sit to Supine     Supine to sit: Supervision Sit to supine: Supervision   General bed mobility comments: no physical assist  Transfers Overall transfer level: Needs assistance Equipment used: 1 person hand held assist Transfers: Sit to/from Stand Sit to Stand: Min assist         General transfer comment: cues for UE use, slowing down, and attending to safety.      Balance Overall balance assessment: Needs assistance;History of Falls Sitting-balance support: Feet supported Sitting balance-Leahy Scale: Good Sitting balance - Comments: pt able to perform reaching outside of BOS    Standing balance support: No upper  extremity supported;During functional activity Standing balance-Leahy Scale: Poor Standing balance comment: pt needs MinA and occasional use of UE support on furniture.                              ADL Overall ADL's : Needs assistance/impaired     Grooming: Min guard;Wash/dry hands;Wash/dry face;Standing               Lower Body Dressing: Supervision/safety;Minimal assistance;Sit to/from stand Lower Body Dressing Details (indicate cue type and reason): able to don and doff socks in sitting independently, min assist otherwise due to impaired standing balance Toilet Transfer: Minimal assistance;Stand-pivot Toilet Transfer Details (indicate cue type and reason): simulated to and from chair           General ADL Comments: Pt with poor awareness of deficits, planning to return home, states he has a friend that will stay with him.Poor judgement is likely baseline.     Vision     Perception     Praxis      Pertinent Vitals/Pain Pain Assessment: No/denies pain     Hand Dominance Right   Extremity/Trunk Assessment Upper Extremity Assessment Upper Extremity Assessment: Defer to OT evaluation   Lower Extremity Assessment Lower Extremity Assessment: RLE deficits/detail RLE Deficits / Details: Generally weak and with decreased coordination.   RLE Coordination: decreased fine motor   Cervical / Trunk Assessment Cervical / Trunk Assessment: Normal   Communication Communication Communication: No difficulties   Cognition Arousal/Alertness: Awake/alert Behavior During Therapy: Impulsive Overall Cognitive Status: Impaired/Different from baseline Area of Impairment: Memory;Safety/judgement;Problem solving  Memory: Decreased recall of precautions   Safety/Judgement: Decreased awareness of safety;Decreased awareness of deficits   Problem Solving: Slow processing;Decreased initiation;Difficulty sequencing;Requires verbal cues;Requires tactile cues General  Comments: pt impulsive and setting off bed alarm on arrival despite multiple attempts at educating on need for A for mobility from OT and RNs.  pt HR up to 140's during mobility and when PT mentions this pt states it is inaccurate and faulty wiring.     General Comments       Exercises Exercises: Other exercises Other Exercises Other Exercises: wide range reaching in sitting with R UE, coordination activities with common objects in room   Shoulder Instructions      Home Living Family/patient expects to be discharged to:: Private residence Living Arrangements: Alone Available Help at Discharge: Available 24 hours/day (friend and a neighbor across the street) Type of Home: House Home Access: Stairs to enter Secretary/administrator of Steps: 4 Entrance Stairs-Rails: None Home Layout: One level     Bathroom Shower/Tub: Chief Strategy Officer: Standard     Home Equipment: None          Prior Functioning/Environment Level of Independence: Independent             OT Diagnosis:     OT Problem List:     OT Treatment/Interventions:      OT Goals(Current goals can be found in the care plan section) Acute Rehab OT Goals Patient Stated Goal: to go home  OT Frequency: Min 3X/week   Barriers to D/C:            Co-evaluation              End of Session    Activity Tolerance: Patient tolerated treatment well Patient left: in bed;with call bell/phone within reach;with bed alarm set   Time: 1130-1203 OT Time Calculation (min): 33 min Charges:  OT General Charges $OT Visit: 1 Procedure OT Treatments $Self Care/Home Management : 8-22 mins $Therapeutic Exercise: 8-22 mins G-Codes:    Evern Bio 05/07/2015, 3:33 PM  [id number]

## 2015-05-08 MED ORDER — LEVETIRACETAM 500 MG PO TABS
500.0000 mg | ORAL_TABLET | Freq: Two times a day (BID) | ORAL | Status: DC
  Administered 2015-05-08 – 2015-05-09 (×3): 500 mg via ORAL
  Filled 2015-05-08 (×3): qty 1

## 2015-05-08 NOTE — Progress Notes (Signed)
Overall doing well. Denies headache. No pain or other problems.  Afebrile. Awake and alert. Oriented and appropriate. Cranial nerve function intact. Motor examination of extremities intact. No pronator drift. Wound clean and dry. Chest and abdomen benign.  Doing well status post subdural hematoma evacuation. Plan transfer to floor. Possible discharge home tomorrow as patient does not desire either a skilled nursing facility or inpatient rehabilitation.

## 2015-05-08 NOTE — Progress Notes (Signed)
Received from [facility] ICU via wheelchair; oriented patient to room and unit routine; bed alarm set and safety reviewed with patient.

## 2015-05-09 LAB — TYPE AND SCREEN
ABO/RH(D): O POS
ANTIBODY SCREEN: POSITIVE
DAT, IGG: NEGATIVE
PT AG Type: NEGATIVE
UNIT DIVISION: 0

## 2015-05-09 NOTE — Discharge Summary (Signed)
Physician Discharge Summary  Patient ID: Cory Dennis MRN: [id number] DOB/AGE: [birth year] 70 y.o.  Admit date: 05/05/2015 Discharge date: 05/09/2015  Admission Diagnoses:  Discharge Diagnoses:  Active Problems:   Subdural hematoma [facility])   Discharged Condition: good  Hospital Course: The patient was admitted to the hospital where he underwent an uncomplicated craniotomy and evacuation of subdural hematoma. Postoperative use done very well. He said complete resolution of his headaches. He is not having symptoms of numbness or weakness. He is ambulating without difficulty. He is ready for discharge home.  Consults:   Significant Diagnostic Studies:   Treatments:   Discharge Exam: Blood pressure 113/60, pulse 92, temperature 98.1 F (36.7 C), temperature source Oral, resp. rate 18, height 5\' 4"  (1.626 m), weight 64.7 kg (142 lb 10.2 oz), SpO2 93 %. Awake and alert. Oriented and appropriate. Motor sensory function intact. Wound clean and dry. Chest and abdomen benign.  Disposition: 07-Left Against Medical Advice     Medication List    TAKE these medications        albuterol 108 (90 BASE) MCG/ACT inhaler  Commonly known as:  PROVENTIL HFA;VENTOLIN HFA  Inhale 2 puffs into the lungs every 6 (six) hours as needed for shortness of breath.     aspirin 81 MG tablet  Take 81 mg by mouth daily.     folic acid 1 MG tablet  Commonly known as:  FOLVITE  Take 1 tablet (1 mg total) by mouth daily.     furosemide 40 MG tablet  Commonly known as:  LASIX  Take 1 tablet (40 mg total) by mouth 2 (two) times daily.     HYDROcodone-acetaminophen 10-500 MG tablet  Commonly known as:  LORTAB  Take 1 tablet by mouth every 6 (six) hours as needed for pain.     ipratropium-albuterol 0.5-2.5 (3) MG/3ML Soln  Commonly known as:  DUONEB  Take 3 mLs by nebulization every 4 (four) hours.     lisinopril 10 MG tablet  Commonly known as:  PRINIVIL,ZESTRIL  Take 1 tablet (10 mg total) by  mouth daily.     metoprolol tartrate 25 MG tablet  Commonly known as:  LOPRESSOR  Take 1 tablet (25 mg total) by mouth 2 (two) times daily.     tiotropium 18 MCG inhalation capsule  Commonly known as:  SPIRIVA HANDIHALER  Place 1 capsule (18 mcg total) into inhaler and inhale daily.     varenicline 1 MG tablet  Commonly known as:  CHANTIX CONTINUING MONTH PAK  Take 1 tablet (1 mg total) by mouth 2 (two) times daily.           Follow-up Information    Follow up with JONES,DAVID S, MD. Schedule an appointment as soon as possible for a visit in 1 week.   Specialty:  Neurosurgery   Contact information:   [address] [place] [address] [phone]       Signed: Temple Pacini 05/09/2015, 10:37 AM

## 2015-05-09 NOTE — Progress Notes (Signed)
Patient ready for discharge to home; discharge instructions given and reviewed; family member here to accompany him home.

## 2015-06-03 ENCOUNTER — Other Ambulatory Visit: Payer: Self-pay | Admitting: Cardiology

## 2015-06-03 NOTE — Telephone Encounter (Signed)
°*  STAT* If patient is at the pharmacy, call can be transferred to refill team.   1. Which medications need to be refilled? (please list name of each medication and dose if known) Lisinopril   2. Which pharmacy/location (including street and city if local pharmacy) is medication to be sent to?[facility] on [address]   3. Do they need a 30 day or 90 day supply? 90

## 2015-06-04 ENCOUNTER — Other Ambulatory Visit: Payer: Self-pay | Admitting: *Deleted

## 2015-06-04 MED ORDER — LISINOPRIL 10 MG PO TABS: 10.0000 mg | ORAL_TABLET | Freq: Every day | ORAL | Status: DC

## 2015-06-04 NOTE — Telephone Encounter (Signed)
Rx(s) sent to pharmacy electronically.  

## 2015-06-30 IMAGING — CR DG CHEST 2V
2 series · 2 of 2 positions shown · non-contrast
Comparison: 08/19/2014

CLINICAL DATA: Acute on chronic systolic and diastolic congestive
heart failure. COPD. Tobacco abuse.

EXAM:
CHEST  2 VIEW

[view not recorded (1 of 2)]
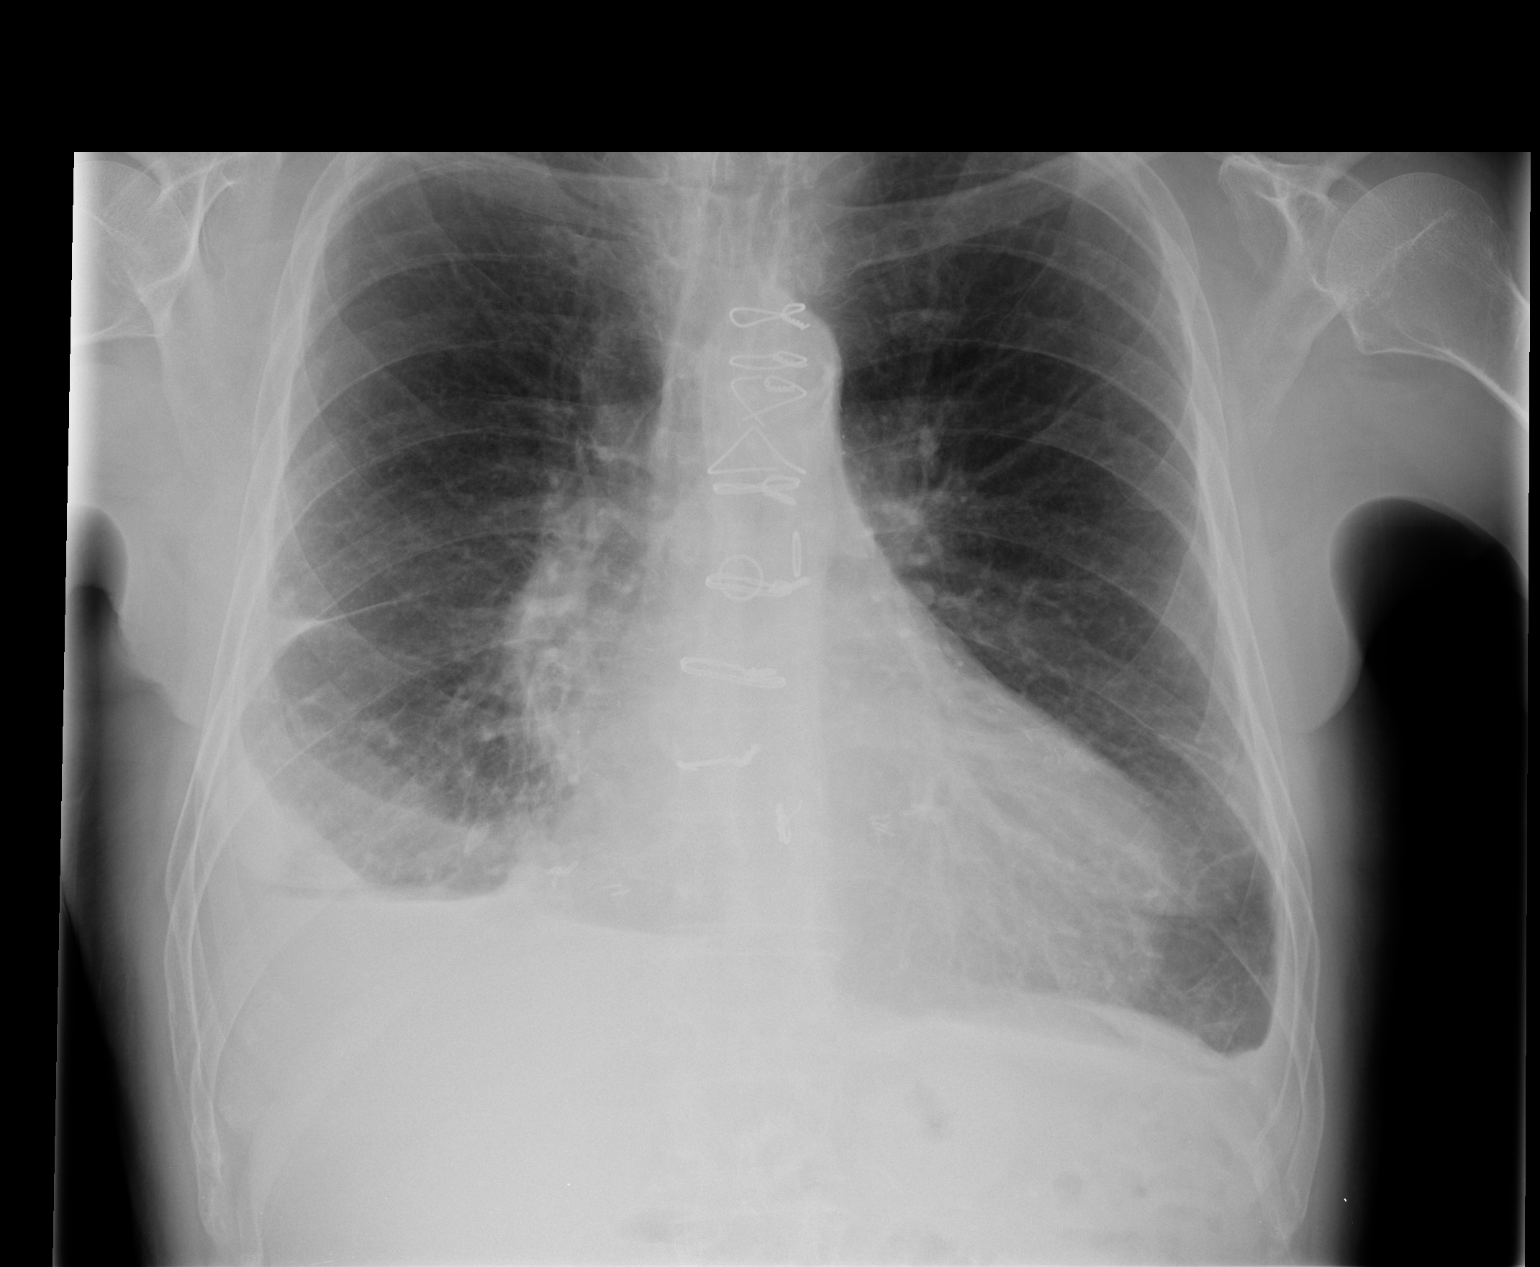

[view not recorded (2 of 2)]
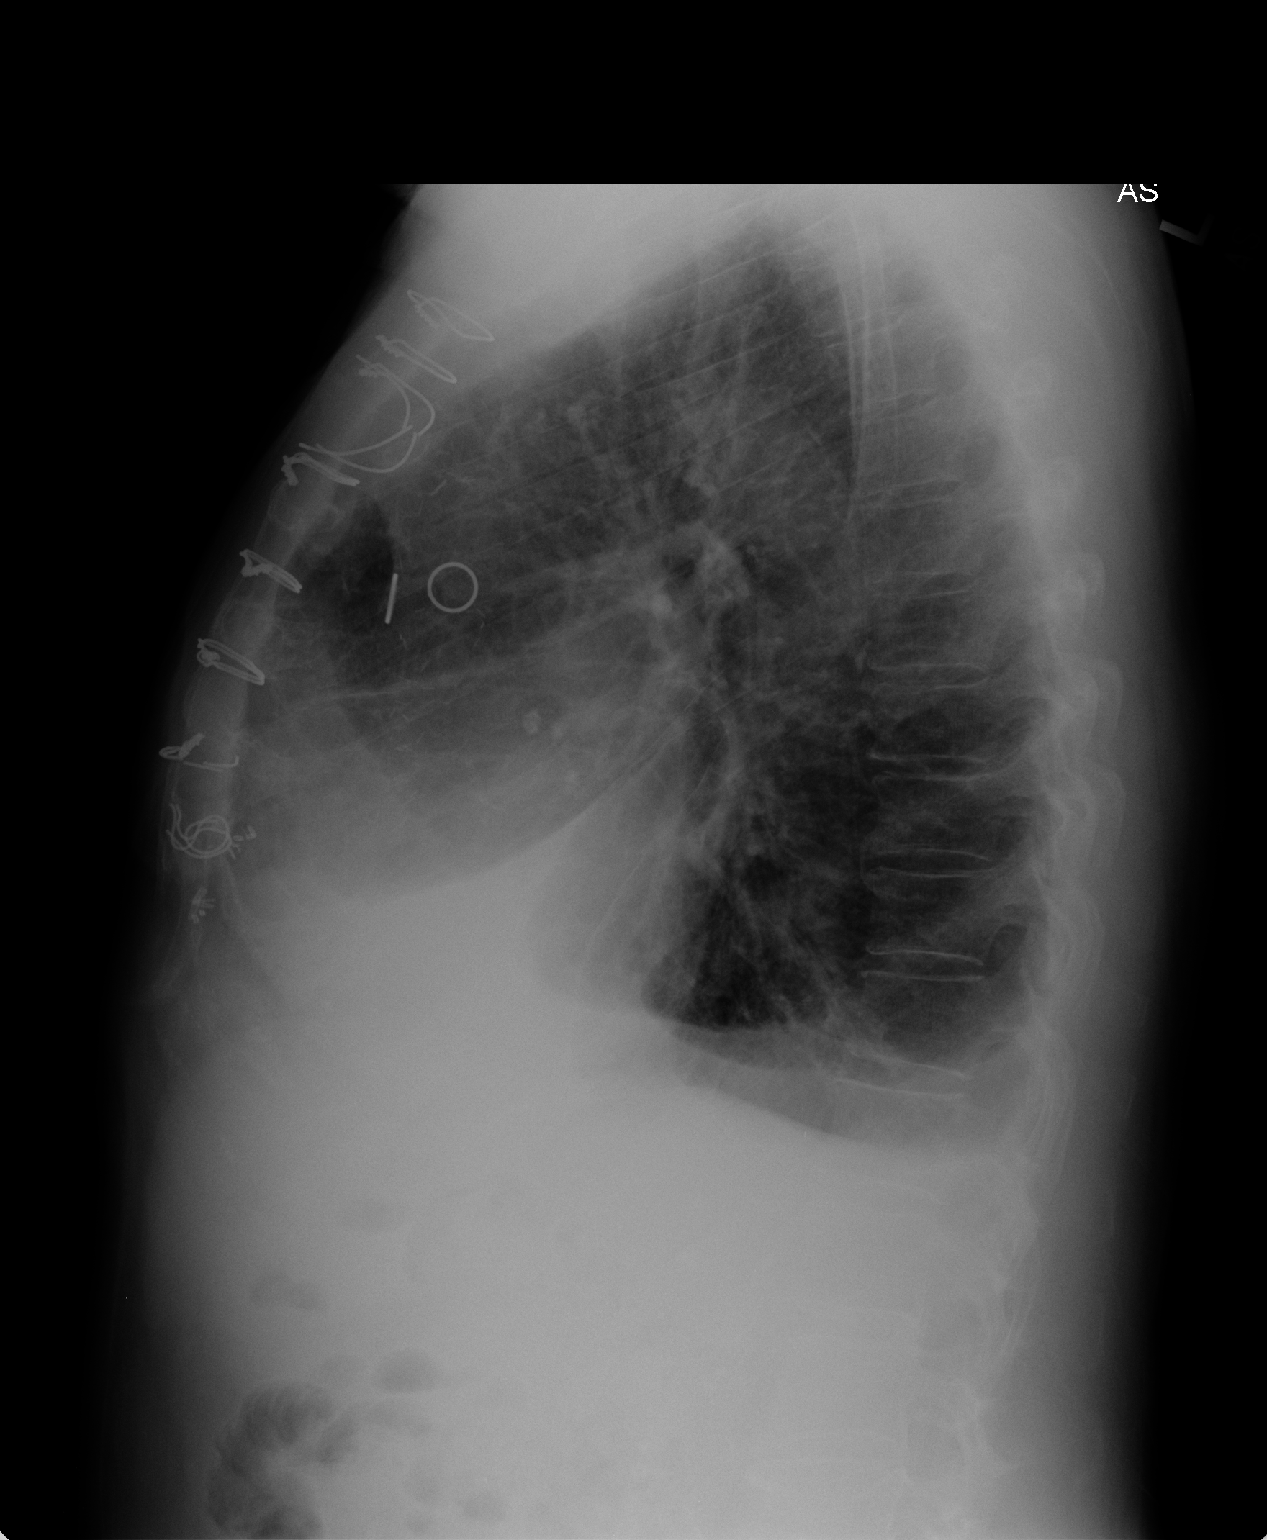

[2 of 2 positions shown; findings below may reference images not displayed]

FINDINGS: Prior CABG. Cardiac enlargement. Improvement in vascular congestion.
Bilateral pleural effusion right greater than left slightly
improved. Mild improvement in bibasilar atelectasis. No edema.
IMPRESSION: Improving congestive heart failure with bilateral  effusion.

## 2015-07-19 ENCOUNTER — Encounter: Payer: Self-pay | Admitting: Cardiology

## 2015-07-19 ENCOUNTER — Ambulatory Visit (INDEPENDENT_AMBULATORY_CARE_PROVIDER_SITE_OTHER): Payer: Medicare Other | Admitting: Cardiology

## 2015-07-19 VITALS — BP 142/64 | HR 70 | Ht 64.0 in | Wt 148.8 lb

## 2015-07-19 DIAGNOSIS — I5022 Chronic systolic (congestive) heart failure: Secondary | ICD-10-CM | POA: Diagnosis not present

## 2015-07-19 DIAGNOSIS — I25810 Atherosclerosis of coronary artery bypass graft(s) without angina pectoris: Secondary | ICD-10-CM | POA: Diagnosis not present

## 2015-07-19 DIAGNOSIS — Z720 Tobacco use: Secondary | ICD-10-CM | POA: Diagnosis not present

## 2015-07-19 NOTE — Progress Notes (Signed)
Cory Dennis Date of Birth: Apr 25, 1946 Medical Record #[id number]  History of Present Illness: Cory Dennis is seen today for followup of coronary disease. He is status post CABG in February 2011 of the [place]. He had a left ventricular aneurysm repair. Ejection fraction was 30%. Since his heart surgery he has had nonunion of the left parasternal region. He was evaluated here by Dr. Donata Clay who did not recommend surgery for his sternum.  He  was seen in April 2016 for symptoms of increased dyspnea. CTA of the chest was negative for PE. Bilateral effusions ans pulmonary edema noted. His lasix was increased to 40 mg bid with marked improvement. His lisinopril dose was decreased due to hyperkalemia with improvement.  He has continued to do well from a cardiac standpoint. No chest pain or dyspnea. Still smoking. He had a fall in October hitting his head. Initial CT was OK. Later developed progressive neurologic symptoms and HA and was found to have a large SDH. Underwent evacuation by Neurosurgery. He has done well and is about back to baseline. He has no edema and weight is stable.   Current Outpatient Prescriptions on File Prior to Visit  Medication Sig Dispense Refill  . albuterol (PROVENTIL HFA;VENTOLIN HFA) 108 (90 BASE) MCG/ACT inhaler Inhale 2 puffs into the lungs every 6 (six) hours as needed for shortness of breath.     Marland Kitchen aspirin 81 MG tablet Take 81 mg by mouth daily.    . folic acid (FOLVITE) 1 MG tablet Take 1 tablet (1 mg total) by mouth daily. 30 tablet 6  . furosemide (LASIX) 40 MG tablet Take 1 tablet (40 mg total) by mouth 2 (two) times daily. 60 tablet 6  . HYDROcodone-acetaminophen (LORTAB) 10-500 MG per tablet Take 1 tablet by mouth every 6 (six) hours as needed for pain.    Marland Kitchen ipratropium-albuterol (DUONEB) 0.5-2.5 (3) MG/3ML SOLN Take 3 mLs by nebulization every 4 (four) hours. (Patient taking differently: Take 3 mLs by nebulization every 6 (six) hours. ) 360 mL 1  .  lisinopril (PRINIVIL,ZESTRIL) 10 MG tablet Take 1 tablet (10 mg total) by mouth daily. 30 tablet 7  . metoprolol tartrate (LOPRESSOR) 25 MG tablet Take 1 tablet (25 mg total) by mouth 2 (two) times daily. 60 tablet 6  . tiotropium (SPIRIVA HANDIHALER) 18 MCG inhalation capsule Place 1 capsule (18 mcg total) into inhaler and inhale daily. 30 capsule 12   No current facility-administered medications on file prior to visit.    Allergies  Allergen Reactions  . Penicillins Other (See Comments)    Doesn't remember  Has patient had a PCN reaction causing immediate rash, facial/tongue/throat swelling, SOB or lightheadedness with hypotension: unknown Has patient had a PCN reaction causing severe rash involving mucus membranes or skin necrosis: unknown Has patient had a PCN reaction that required hospitalization unknown Has patient had a PCN reaction occurring within the last 10 years: unknown If all of the above answers are "NO", then may proceed with Cephalosporin use.     Past Medical History  Diagnosis Date  . Myocardial infarction (HCC)   . Musculoskeletal chest pain   . Fatigue   . SOB (shortness of breath)   . Arthritis   . Anxiety   . Depression   . Syncope   . Ischemic cardiomyopathy   . COPD (chronic obstructive pulmonary disease) (HCC)   . Hypertension   . Tobacco abuse   . Alcohol abuse   . CAD (coronary artery disease)   .  Anemia   . Emphysema of lung (HCC)   . CHF (congestive heart failure) (HCC)   . Chronic systolic CHF (congestive heart failure) (HCC) 11/06/2014    Past Surgical History  Procedure Laterality Date  . Coronary artery bypass graft  2011    4v CABG [facility] in [place] in 06/2009 with LIMA to LAD, RIMA to distal RCA, sequential SVG to OM1/OM 2  . Left ventricular aneurysm repair    . Craniotomy Left 05/05/2015    Procedure: Left CRANIOTOMY HEMATOMA EVACUATION SUBDURAL;  Surgeon: Tia Alert, MD;  Location: [facility];  Service: Neurosurgery;   Laterality: Left;    History  Smoking status  . Current Every Day Smoker -- 0.25 packs/day for 42 years  . Types: Cigarettes  Smokeless tobacco  . Never Used    Comment: working on it    History  Alcohol Use  . 12.0 - 14.4 oz/week  . 20-24 Cans of beer per week    Comment: 5-6 beers per day    Family History  Problem Relation Age of Onset  . Family history unknown: Yes    Review of Systems: The review of systems as noted in HPI. All other systems were reviewed and are negative.  Physical Exam: BP 142/64 mmHg  Pulse 70  Ht  (1.626 m)  Wt 67.495 kg (148 lb 12.8 oz)  BMI 25.53 kg/m2 He is a chronically ill appearing white male in no acute distress. HEENT exam is unremarkable.  Neck is supple without JVD, adenopathy, thyromegaly, or bruits. Lungs reveal scant rhonchi. Decreased BS in bases. Cardiac exam reveals a regular rate and rhythm without gallop, murmur, or click. Chest wall reveals a median sternotomy scar. There is nonunion of the lower left parasternal region with  tenderness to palpation. There is a small ventral hernia in the subxiphoid area. Abdomen is soft and nontender without masses or bruits. There is no hepatosplenomegaly. Femoral and pedal pulses are 2+ and symmetric. He has no edema. Skin is warm and dry. He is alert and oriented x3. Cranial nerves II through XII are intact.  LABORATORY DATA: Lab Results  Component Value Date   WBC 10.1 05/05/2015   HGB 14.0 05/05/2015   HCT 40.4 05/05/2015   PLT 283 05/05/2015   GLUCOSE 104* 05/05/2015   CHOL 169 06/24/2014   TRIG 48 06/24/2014   HDL 70 06/24/2014   LDLCALC 89 06/24/2014   ALT 14 09/07/2014   AST 21 09/07/2014   NA 133* 05/05/2015   K 4.4 05/05/2015   CL 98* 05/05/2015   CREATININE 1.19 05/05/2015   BUN 16 05/05/2015   CO2 22 05/05/2015   TSH 1.147 08/19/2014   INR 1.06 05/05/2015     Assessment / Plan: 1. Coronary disease status post CABG in 2011. Clinically stable.  2. Chronic  congestive heart failure with ischemic cardiomyopathy. Ejection fraction 35%. Patient has deferred to ICD placement. Well compensated today.  He is on appropriate medical therapy with ACE inhibitor, beta blocker, and Aldactone. I have recommended he stay on lasix 40 mg bid.  I will follow up in 6 months. Labs from hospitalization in Dec. Reviewed and are stable.   3. 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  4. Tobacco abuse. Patient counseled on smoking cessation.   5. COPD with chronic bronchitis.

## 2015-07-19 NOTE — Patient Instructions (Signed)
Continue your current therapy  Quit smoking.

## 2015-07-21 IMAGING — CR DG CHEST 2V
2 series · 2 of 2 positions shown · non-contrast
Comparison: 09/08/2014 and earlier.

CLINICAL DATA: 68-year-old male with shortness of breath, fluid in
the right lung, abnormal right lung auscultation. Subsequent
encounter.

EXAM:
CHEST  2 VIEW

[w chest pa]
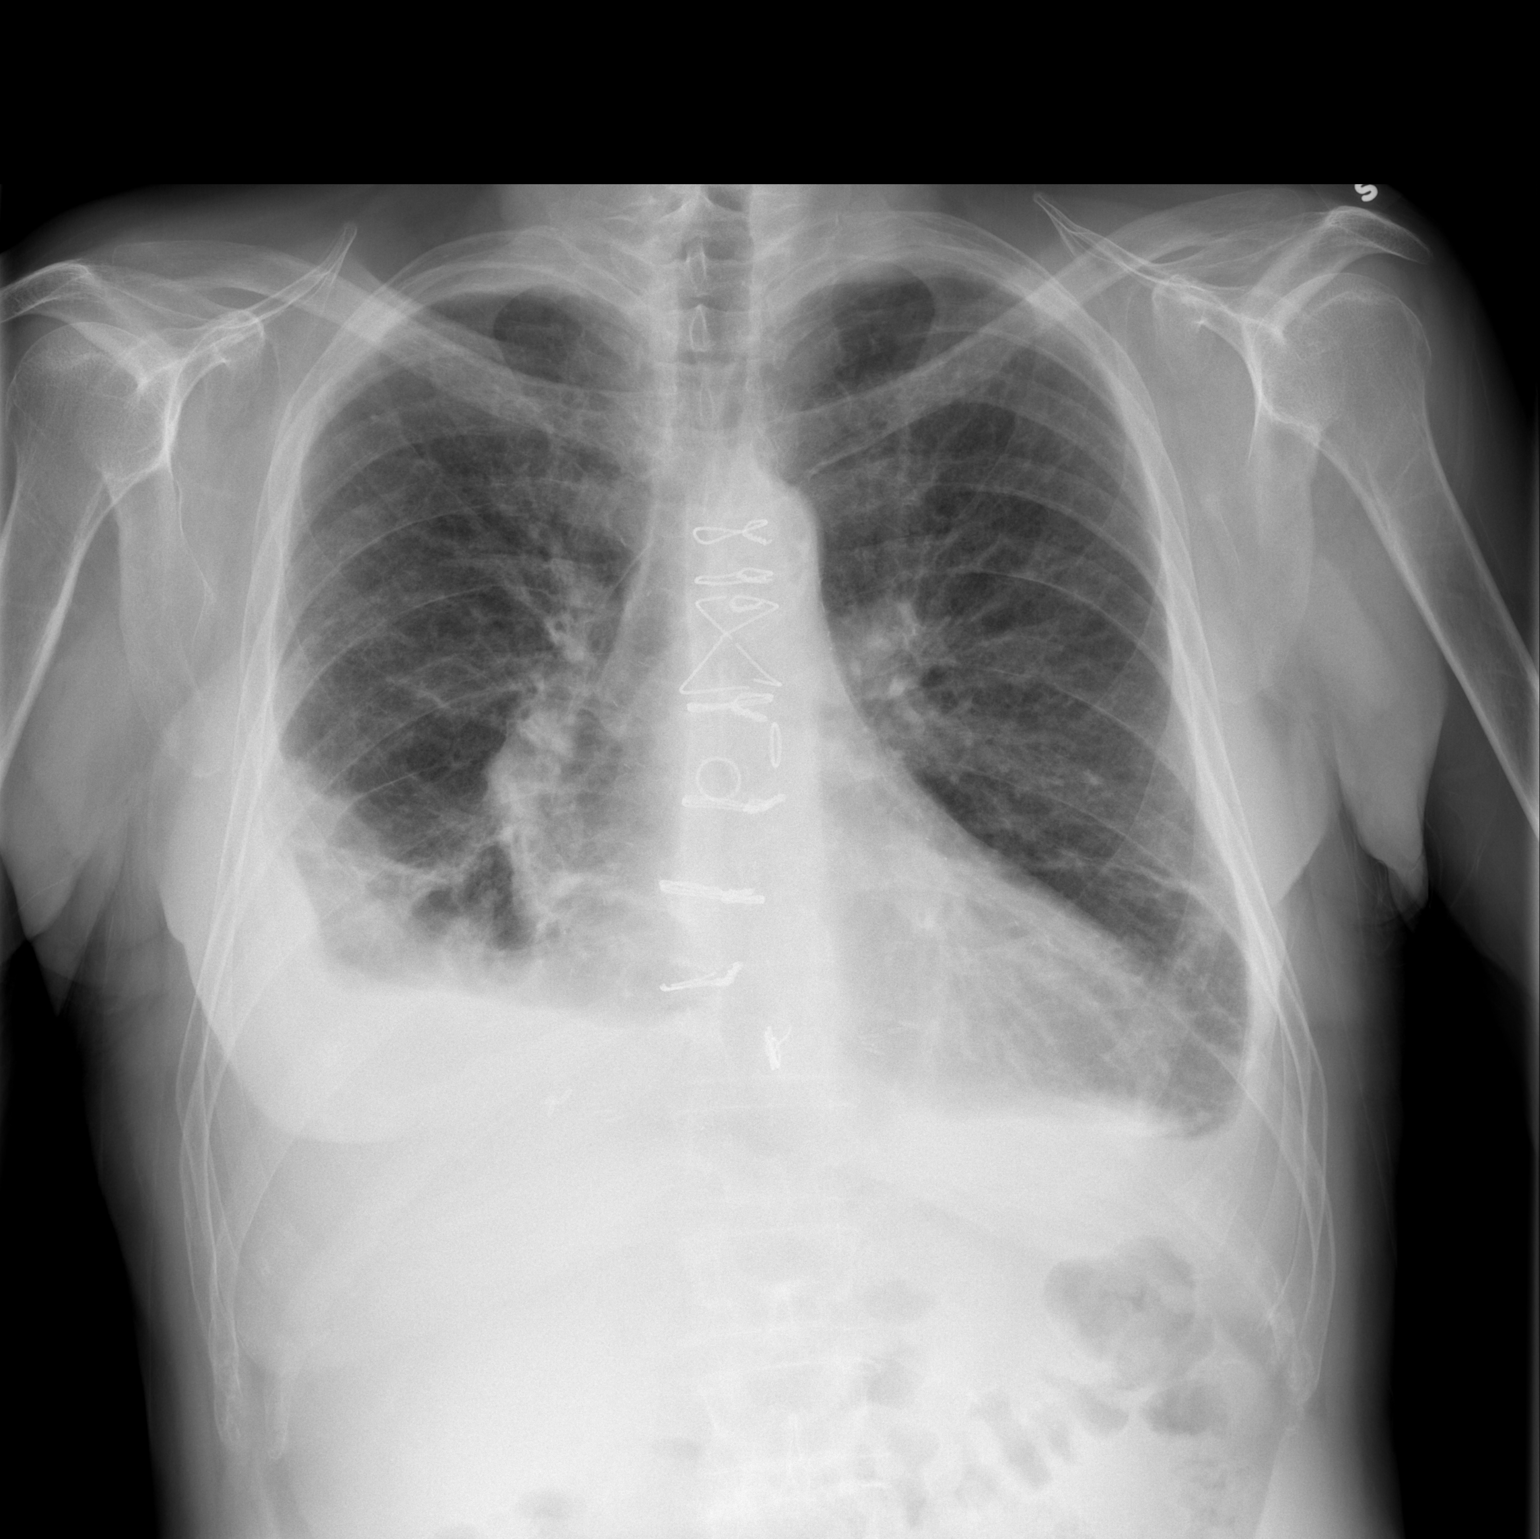

[w chest lat]
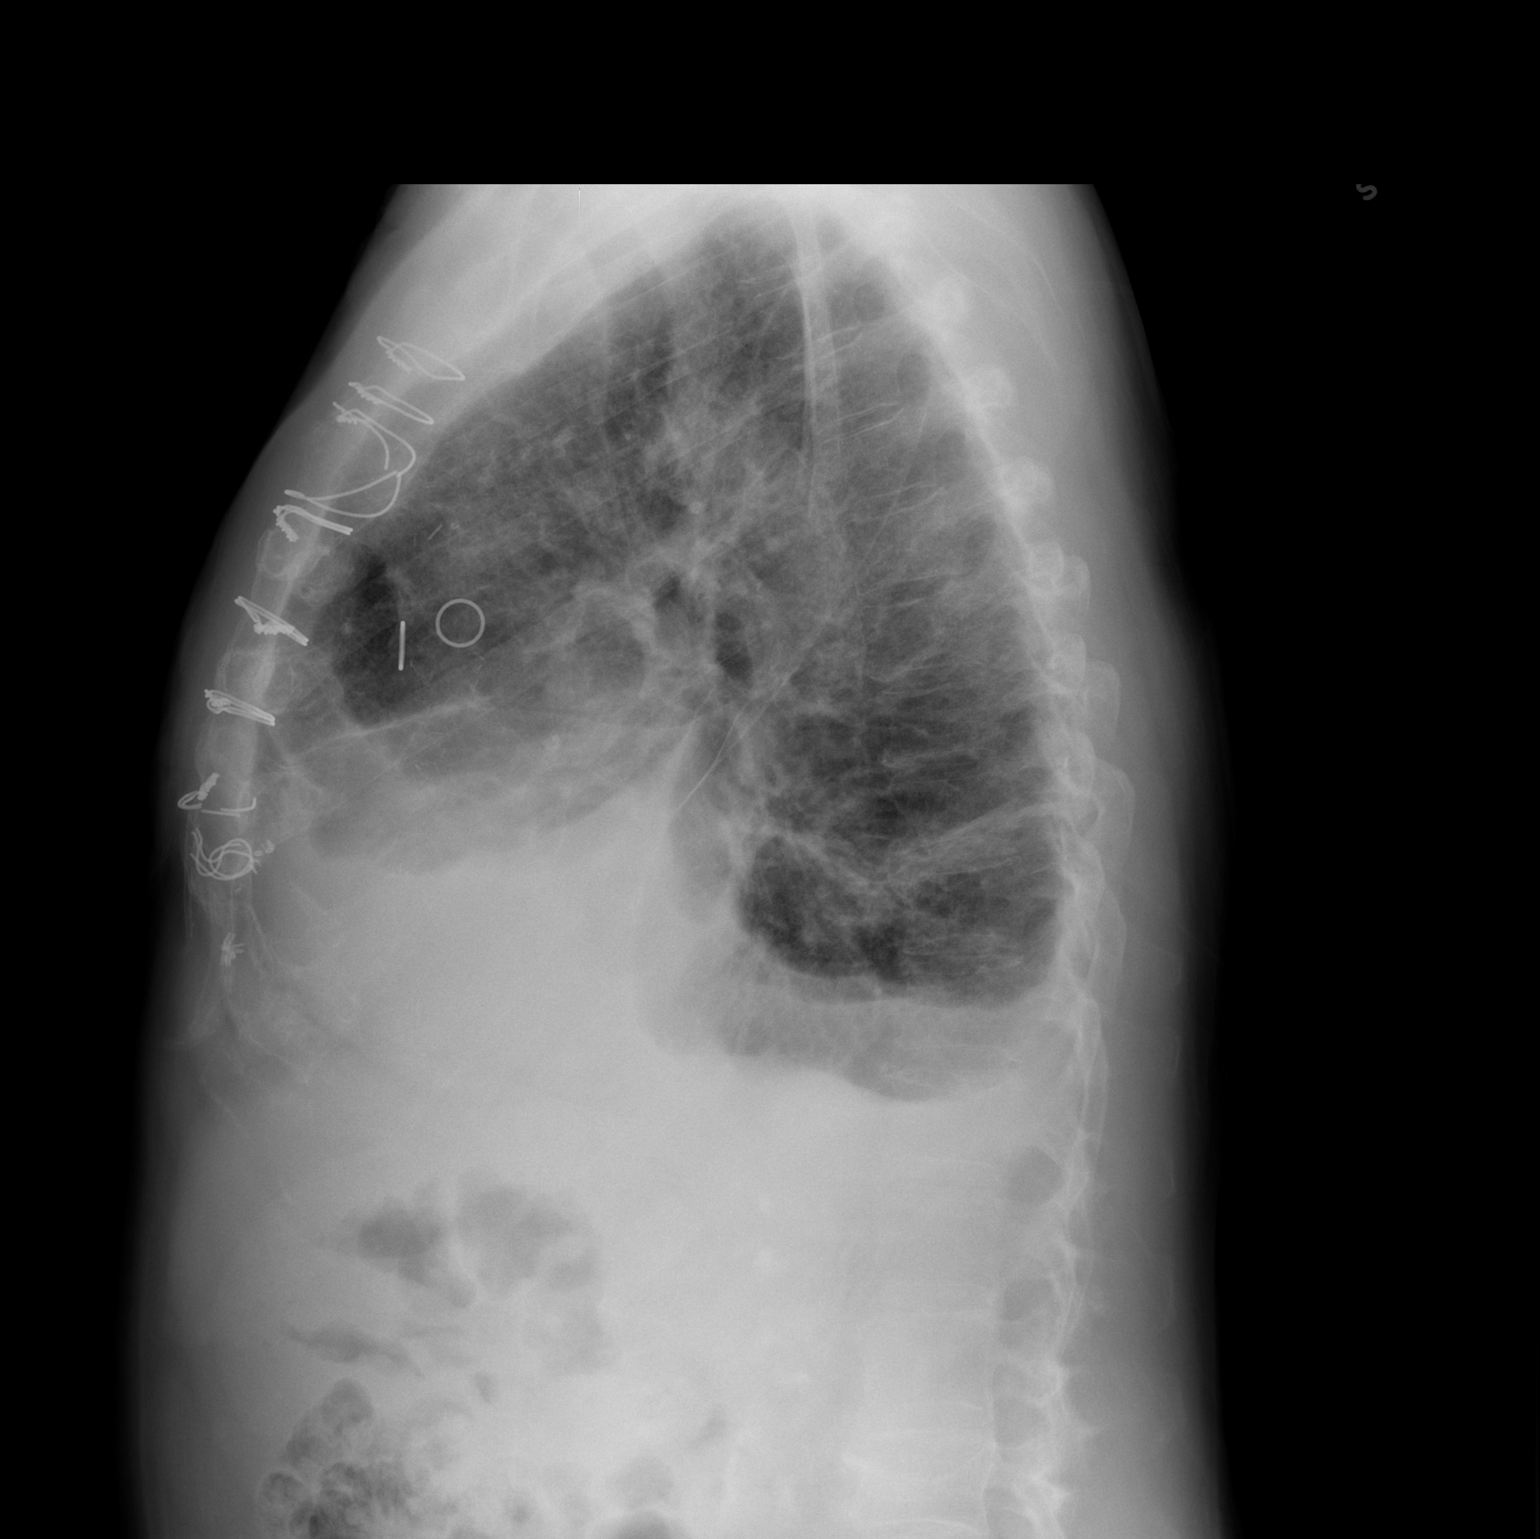

[2 of 2 positions shown; findings below may reference images not displayed]

FINDINGS: Moderate right greater than left pleural effusions persist and
appears slightly larger than in [place]. Stable underlying lung
volumes. No pneumothorax. Stable pulmonary vascularity, a decreased
compared to 08/19/2014. Stable cardiac size and mediastinal
contours. Sequelae of CABG. No superimposed pulmonary consolidation.
Stable visualized osseous structures. Calcified atherosclerosis of
the aorta.
IMPRESSION: Continued pulmonary vascular congestion and bilateral pleural
effusions which appear mildly larger than in [place].

## 2015-08-31 ENCOUNTER — Other Ambulatory Visit: Payer: Self-pay | Admitting: Cardiology

## 2015-09-01 NOTE — Telephone Encounter (Signed)
Rx(s) sent to pharmacy electronically.  

## 2015-11-08 ENCOUNTER — Other Ambulatory Visit: Payer: Self-pay | Admitting: Cardiology

## 2015-11-08 MED ORDER — FUROSEMIDE 40 MG PO TABS: 40.0000 mg | ORAL_TABLET | Freq: Two times a day (BID) | ORAL | Status: DC

## 2015-11-08 NOTE — Telephone Encounter (Signed)
Rx(s) sent to pharmacy electronically.  

## 2015-11-15 ENCOUNTER — Ambulatory Visit (INDEPENDENT_AMBULATORY_CARE_PROVIDER_SITE_OTHER): Payer: Medicare Other | Admitting: Physician Assistant

## 2015-11-15 ENCOUNTER — Encounter: Payer: Self-pay | Admitting: Physician Assistant

## 2015-11-15 VITALS — BP 114/66 | HR 72 | Temp 97.9°F | Resp 18 | Wt 150.0 lb

## 2015-11-15 DIAGNOSIS — I25810 Atherosclerosis of coronary artery bypass graft(s) without angina pectoris: Secondary | ICD-10-CM | POA: Diagnosis not present

## 2015-11-15 DIAGNOSIS — Z720 Tobacco use: Secondary | ICD-10-CM | POA: Diagnosis not present

## 2015-11-15 DIAGNOSIS — I5022 Chronic systolic (congestive) heart failure: Secondary | ICD-10-CM | POA: Diagnosis not present

## 2015-11-15 DIAGNOSIS — E871 Hypo-osmolality and hyponatremia: Secondary | ICD-10-CM | POA: Diagnosis not present

## 2015-11-15 DIAGNOSIS — Z951 Presence of aortocoronary bypass graft: Secondary | ICD-10-CM | POA: Diagnosis not present

## 2015-11-15 DIAGNOSIS — F1010 Alcohol abuse, uncomplicated: Secondary | ICD-10-CM

## 2015-11-15 DIAGNOSIS — R55 Syncope and collapse: Secondary | ICD-10-CM | POA: Diagnosis not present

## 2015-11-15 DIAGNOSIS — I255 Ischemic cardiomyopathy: Secondary | ICD-10-CM

## 2015-11-15 DIAGNOSIS — J439 Emphysema, unspecified: Secondary | ICD-10-CM

## 2015-11-15 NOTE — Progress Notes (Signed)
Patient ID: Cory Dennis MRN: [id number], DOB: 07-30-1945, 70 y.o. Date of Encounter: 11/15/2015, 4:59 PM    Chief Complaint:  Chief Complaint  Patient presents with  . episodes of cough spells    loosing breath and passing out     HPI: 70 y.o. year old white male --here today with a younger white male. I asked if she is his daughter-in-law. She responds that no, she isnt the same one that was here with him at prior visit. She says that they were not taking care of him and when she went to check on him in December and found the horrible condition they had him living in, she started helping care for him.   I aksed if she has witnessed any syncopal episiodes. She says no but "when she wnet up there Thursday, he looked really pale" and she asked what was wrong with him and found out that he had had a coughing spell an dpassed out. I asked where he was at tht time  When she says "up there"--she says at the bar.  I ask how much he is drinking--he says 5-6. She says 6-10----meaning beers per day. I asked about additional alcohol---he says "occasional shot" and she adds "supposed to be 3 or less--I have told him not to have more than 3".   Discussed with them that--today I reviewed my prior OV note from 03/2015---at that time had reviewed that he has known h/o hyponatremia, that alcohol cna cause hyponatremia. Also discussed that he was not being compliant with his COPD meds.   Again today--he says he is not using either Spiriva or Symbicort but is aware he is supposed to.   Discussed with both of them that the first step in evaluating/treating this is reduced alcohol intake and compliance with COPD medicaitons.      Home Meds:   Outpatient Prescriptions Prior to Visit  Medication Sig Dispense Refill  . albuterol (PROVENTIL HFA;VENTOLIN HFA) 108 (90 BASE) MCG/ACT inhaler Inhale 2 puffs into the lungs every 6 (six) hours as needed for shortness of breath.     Marland Kitchen aspirin 81 MG tablet  Take 81 mg by mouth daily.    . folic acid (FOLVITE) 1 MG tablet Take 1 tablet (1 mg total) by mouth daily. 30 tablet 6  . furosemide (LASIX) 40 MG tablet Take 1 tablet (40 mg total) by mouth 2 (two) times daily. <PLEASE MAKE APPOINTMENT FOR REFILLS> 180 tablet 0  . lisinopril (PRINIVIL,ZESTRIL) 10 MG tablet Take 1 tablet (10 mg total) by mouth daily. 30 tablet 7  . metoprolol tartrate (LOPRESSOR) 25 MG tablet Take 1 tablet (25 mg total) by mouth 2 (two) times daily. 60 tablet 6  . tiotropium (SPIRIVA HANDIHALER) 18 MCG inhalation capsule Place 1 capsule (18 mcg total) into inhaler and inhale daily. 30 capsule 12  . HYDROcodone-acetaminophen (LORTAB) 10-500 MG per tablet Take 1 tablet by mouth every 6 (six) hours as needed for pain. Reported on 11/15/2015    . ipratropium-albuterol (DUONEB) 0.5-2.5 (3) MG/3ML SOLN Take 3 mLs by nebulization every 4 (four) hours. (Patient not taking: Reported on 11/15/2015) 360 mL 1   No facility-administered medications prior to visit.    Allergies:  Allergies  Allergen Reactions  . Penicillins Other (See Comments)    Doesn't remember  Has patient had a PCN reaction causing immediate rash, facial/tongue/throat swelling, SOB or lightheadedness with hypotension: unknown Has patient had a PCN reaction causing severe rash involving mucus membranes or  skin necrosis: unknown Has patient had a PCN reaction that required hospitalization unknown Has patient had a PCN reaction occurring within the last 10 years: unknown If all of the above answers are "NO", then may proceed with Cephalosporin use.       Review of Systems: See HPI for pertinent ROS. All other ROS negative.    Physical Exam: Blood pressure 114/66, pulse 72, temperature 97.9 F (36.6 C), temperature source Oral, resp. rate 18, weight 150 lb (68.04 kg), SpO2 97 %., Body mass index is 25.73 kg/(m^2). General: WM.  Appears in no acute distress. Neck: Supple. No thyromegaly. No lymphadenopathy.No  carotid bruit.  Lungs: Clear bilaterally to auscultation without wheezes, rales, or rhonchi. Breathing is unlabored. Heart: Regular rhythm. No murmurs, rubs, or gallops. Abdomen: Soft, non-tender, non-distended with normoactive bowel sounds. No hepatomegaly. No rebound/guarding. No obvious abdominal masses. Msk:  Strength and tone normal for age. Extremities/Skin: Warm and dry.  No edema.  Neuro: Alert and oriented X 3. Moves all extremities spontaneously. Gait is normal. CNII-XII grossly in tact. Psych:  Responds to questions appropriately with a normal affect.     ASSESSMENT AND PLAN:  70 y.o. year old male with  1. Pulmonary emphysema, unspecified emphysema type (HCC) He needs to be compliant with treatment 2. Chronic systolic CHF (congestive heart failure) (HCC) Managed by Cardiology 3. Ischemic cardiomyopathy Managed by Cardiology  4. Coronary artery disease involving coronary bypass graft of native heart without angina pectoris Managed by Cardiology  5. Tobacco abuse  6. S/P CABG (coronary artery bypass graft) Managed by Cardiology  7. Alcohol abuse He needs to limit alcohol use but is not interested. Probably needs assistance with using Rehab or AA etc but pt not interested. - CBC with Differential/Platelet - COMPLETE METABOLIC PANEL WITH GFR  8. Hyponatremia Possibly secondary to alcohol. Will recheck levels now.  - COMPLETE METABOLIC PANEL WITH GFR  9. Syncope, unspecified syncope type May be secondary to alcohol, low sodium, COPD, etc---Will check labs and f/u with pt with results.  - CBC with Differential/Platelet - COMPLETE METABOLIC PANEL WITH GFR   Signed, [address] [place], [place], [facility] 11/15/2015 4:59 PM

## 2015-11-16 LAB — CBC WITH DIFFERENTIAL/PLATELET
BASOS ABS: 0 {cells}/uL (ref 0–200)
BASOS PCT: 0 %
EOS ABS: 0 {cells}/uL — AB (ref 15–500)
EOS PCT: 0 %
HCT: 42 % (ref 38.5–50.0)
Hemoglobin: 14.3 g/dL (ref 13.0–17.0)
LYMPHS ABS: 3400 {cells}/uL (ref 850–3900)
Lymphocytes Relative: 34 %
MCH: 31.3 pg (ref 27.0–33.0)
MCHC: 34 g/dL (ref 32.0–36.0)
MCV: 91.9 fL (ref 80.0–100.0)
MONO ABS: 900 {cells}/uL (ref 200–950)
MONOS PCT: 9 %
MPV: 9.5 fL (ref 7.5–12.5)
NEUTROS ABS: 5700 {cells}/uL (ref 1500–7800)
NEUTROS PCT: 57 %
PLATELETS: 217 10*3/uL (ref 140–400)
RBC: 4.57 MIL/uL (ref 4.20–5.80)
RDW: 14 % (ref 11.0–15.0)
WBC: 10 10*3/uL (ref 3.8–10.8)

## 2015-11-16 LAB — COMPLETE METABOLIC PANEL WITH GFR
ALT: 16 U/L (ref 9–46)
AST: 22 U/L (ref 10–35)
Albumin: 4.2 g/dL (ref 3.6–5.1)
Alkaline Phosphatase: 80 U/L (ref 40–115)
BILIRUBIN TOTAL: 0.5 mg/dL (ref 0.2–1.2)
BUN: 42 mg/dL — ABNORMAL HIGH (ref 7–25)
CO2: 27 mmol/L (ref 20–31)
Calcium: 9.7 mg/dL (ref 8.6–10.3)
Chloride: 97 mmol/L — ABNORMAL LOW (ref 98–110)
Creat: 1.46 mg/dL — ABNORMAL HIGH (ref 0.70–1.18)
GFR, EST NON AFRICAN AMERICAN: 48 mL/min — AB (ref >=60)
GFR, Est African American: 56 mL/min — ABNORMAL LOW (ref >=60)
GLUCOSE: 110 mg/dL — AB (ref 70–99)
Potassium: 5.7 mmol/L — ABNORMAL HIGH (ref 3.5–5.3)
SODIUM: 135 mmol/L (ref 135–146)
TOTAL PROTEIN: 7.3 g/dL (ref 6.1–8.1)

## 2015-11-18 ENCOUNTER — Other Ambulatory Visit: Payer: Self-pay | Admitting: *Deleted

## 2015-11-18 DIAGNOSIS — E876 Hypokalemia: Secondary | ICD-10-CM

## 2015-11-18 DIAGNOSIS — E875 Hyperkalemia: Secondary | ICD-10-CM

## 2015-11-24 ENCOUNTER — Other Ambulatory Visit: Payer: Medicare Other

## 2016-01-03 ENCOUNTER — Other Ambulatory Visit: Payer: Self-pay | Admitting: Cardiology

## 2016-03-02 ENCOUNTER — Other Ambulatory Visit: Payer: Self-pay | Admitting: Cardiology

## 2016-03-02 MED ORDER — FOLIC ACID 1 MG PO TABS: 1.0000 mg | ORAL_TABLET | Freq: Every day | ORAL | 6 refills | Status: DC

## 2016-03-28 ENCOUNTER — Other Ambulatory Visit: Payer: Self-pay | Admitting: Cardiology

## 2016-03-28 NOTE — Telephone Encounter (Signed)
Rx(s) sent to pharmacy electronically.  

## 2016-04-24 DIAGNOSIS — Z23 Encounter for immunization: Secondary | ICD-10-CM | POA: Diagnosis not present

## 2016-07-13 ENCOUNTER — Ambulatory Visit (INDEPENDENT_AMBULATORY_CARE_PROVIDER_SITE_OTHER): Payer: Medicare Other | Admitting: Physician Assistant

## 2016-07-13 ENCOUNTER — Other Ambulatory Visit: Payer: Self-pay | Admitting: Physician Assistant

## 2016-07-13 ENCOUNTER — Telehealth: Payer: Self-pay

## 2016-07-13 ENCOUNTER — Ambulatory Visit (HOSPITAL_COMMUNITY)
Admission: RE | Admit: 2016-07-13 | Discharge: 2016-07-13 | Disposition: A | Discharge status: Home / Self Care | Payer: Medicare Other | Source: Ambulatory Visit | Attending: Physician Assistant | Admitting: Physician Assistant

## 2016-07-13 VITALS — BP 100/60 | HR 92 | Temp 97.8°F | Resp 14 | Wt 152.2 lb

## 2016-07-13 DIAGNOSIS — Z720 Tobacco use: Secondary | ICD-10-CM

## 2016-07-13 DIAGNOSIS — R55 Syncope and collapse: Secondary | ICD-10-CM | POA: Insufficient documentation

## 2016-07-13 DIAGNOSIS — S0990XA Unspecified injury of head, initial encounter: Secondary | ICD-10-CM | POA: Insufficient documentation

## 2016-07-13 DIAGNOSIS — X58XXXA Exposure to other specified factors, initial encounter: Secondary | ICD-10-CM | POA: Insufficient documentation

## 2016-07-13 DIAGNOSIS — S065X9A Traumatic subdural hemorrhage with loss of consciousness of unspecified duration, initial encounter: Secondary | ICD-10-CM

## 2016-07-13 DIAGNOSIS — Z951 Presence of aortocoronary bypass graft: Secondary | ICD-10-CM | POA: Diagnosis not present

## 2016-07-13 DIAGNOSIS — Z8673 Personal history of transient ischemic attack (TIA), and cerebral infarction without residual deficits: Secondary | ICD-10-CM | POA: Diagnosis not present

## 2016-07-13 DIAGNOSIS — I6200 Nontraumatic subdural hemorrhage, unspecified: Secondary | ICD-10-CM | POA: Insufficient documentation

## 2016-07-13 DIAGNOSIS — R5383 Other fatigue: Secondary | ICD-10-CM

## 2016-07-13 DIAGNOSIS — S065XAA Traumatic subdural hemorrhage with loss of consciousness status unknown, initial encounter: Secondary | ICD-10-CM

## 2016-07-13 DIAGNOSIS — E871 Hypo-osmolality and hyponatremia: Secondary | ICD-10-CM | POA: Diagnosis not present

## 2016-07-13 DIAGNOSIS — I5022 Chronic systolic (congestive) heart failure: Secondary | ICD-10-CM

## 2016-07-13 DIAGNOSIS — J439 Emphysema, unspecified: Secondary | ICD-10-CM

## 2016-07-13 DIAGNOSIS — I255 Ischemic cardiomyopathy: Secondary | ICD-10-CM

## 2016-07-13 DIAGNOSIS — D638 Anemia in other chronic diseases classified elsewhere: Secondary | ICD-10-CM

## 2016-07-13 DIAGNOSIS — I2510 Atherosclerotic heart disease of native coronary artery without angina pectoris: Secondary | ICD-10-CM

## 2016-07-13 DIAGNOSIS — F1010 Alcohol abuse, uncomplicated: Secondary | ICD-10-CM | POA: Diagnosis not present

## 2016-07-13 NOTE — Telephone Encounter (Signed)
Cory Dennis  is req  a call with CT scan results as soon as they as they are avail.

## 2016-07-13 NOTE — Progress Notes (Signed)
Patient ID: Cory Dennis MRN: [id number], DOB: February 24, 1946, 71 y.o. Date of Encounter: 07/13/2016, 2:34 PM    Chief Complaint:  Chief Complaint  Patient presents with  . Diarrhea  . low blood pressure  . fainting spells  . Emesis  . Wheezing     HPI: 71 y.o. year old white male --   11/15/2015: here today with a younger white male. I asked if she is his daughter-in-law. She responds that no, she isnt the same one that was here with him at prior visit. She says that they were not taking care of him and when she went to check on him in December and found the horrible condition they had him living in, she started helping care for him.   I aksed if she has witnessed any syncopal episiodes. She says no but "when she wnet up there Thursday, he looked really pale" and she asked what was wrong with him and found out that he had had a coughing spell an dpassed out. I asked where he was at tht time  When she says "up there"--she says at the bar.  I ask how much he is drinking--he says 5-6. She says 6-10----meaning beers per day. I asked about additional alcohol---he says "occasional shot" and she adds "supposed to be 3 or less--I have told him not to have more than 3".   Discussed with them that--today I reviewed my prior OV note from 03/2015---at that time had reviewed that he has known h/o hyponatremia, that alcohol cna cause hyponatremia. Also discussed that he was not being compliant with his COPD meds.   Again today--he says he is not using either Spiriva or Symbicort but is aware he is supposed to.   Discussed with both of them that the first step in evaluating/treating this is reduced alcohol intake and compliance with COPD medicaitons.    07/13/2016: He is here for visit today accompanied by 2 females. They report that he has recently had 2 syncopal episodes within 9 days of each other. Says that one of them occurred on the day of the [place]. He was at the bar and coughed and  had syncope. Second time he was at the bar and had syncope. EMS was called but patient refused to go to the hospital. One of the females states that EMS said the blood pressure was 88/44 when EMS checked it and she says that she continued to monitor blood pressure and it took about 2 hours for it to get back to his normal. She also states that when EMS evaluated him his blood sugar was normal at 149 and his oxygen saturation was excellent. They note that he has had surgery to his head and they're concerned that he could have another blood clot.   They report that he continues to smoke a lot and continues to drink a lot of alcohol.  Nurse had noted the patient had had some vomiting and diarrhea. Patient states that he only vomited once and that the diarrhea resolved a week ago.    Home Meds:   Outpatient Medications Prior to Visit  Medication Sig Dispense Refill  . albuterol (PROVENTIL HFA;VENTOLIN HFA) 108 (90 BASE) MCG/ACT inhaler Inhale 2 puffs into the lungs every 6 (six) hours as needed for shortness of breath.     Marland Kitchen aspirin 81 MG tablet Take 81 mg by mouth daily.    . folic acid (FOLVITE) 1 MG tablet Take 1 tablet (1 mg total) by  mouth daily. 30 tablet 6  . furosemide (LASIX) 40 MG tablet Take 1 tablet (40 mg total) by mouth 2 (two) times daily. <PLEASE MAKE APPOINTMENT FOR REFILLS> 180 tablet 0  . HYDROcodone-acetaminophen (LORTAB) 10-500 MG per tablet Take 1 tablet by mouth every 6 (six) hours as needed for pain. Reported on 11/15/2015    . lisinopril (PRINIVIL,ZESTRIL) 10 MG tablet TAKE 1 TABLET BY MOUTH EVERY DAY 30 tablet 7  . metoprolol tartrate (LOPRESSOR) 25 MG tablet TAKE 1 TABLET TWICE A DAY 60 tablet 8  . tiotropium (SPIRIVA HANDIHALER) 18 MCG inhalation capsule Place 1 capsule (18 mcg total) into inhaler and inhale daily. 30 capsule 12  . ipratropium-albuterol (DUONEB) 0.5-2.5 (3) MG/3ML SOLN Take 3 mLs by nebulization every 4 (four) hours. (Patient not taking: Reported on  11/15/2015) 360 mL 1   No facility-administered medications prior to visit.     Allergies:  Allergies  Allergen Reactions  . Penicillins Other (See Comments)    Doesn't remember  Has patient had a PCN reaction causing immediate rash, facial/tongue/throat swelling, SOB or lightheadedness with hypotension: unknown Has patient had a PCN reaction causing severe rash involving mucus membranes or skin necrosis: unknown Has patient had a PCN reaction that required hospitalization unknown Has patient had a PCN reaction occurring within the last 10 years: unknown If all of the above answers are "NO", then may proceed with Cephalosporin use.       Review of Systems: See HPI for pertinent ROS. All other ROS negative.    Physical Exam: Blood pressure 100/60, pulse 92, temperature 97.8 F (36.6 C), temperature source Oral, resp. rate 14, weight 152 lb 3.2 oz (69 kg), SpO2 96 %., Body mass index is 26.13 kg/m. General: WM.  Appears in no acute distress. Neck: Supple. No thyromegaly. No lymphadenopathy.No carotid bruit.  Lungs: Breath sounds are distant and decreased. But no wheezes, rhonchi, rales.  Heart: Regular rhythm. No murmurs, rubs, or gallops. Abdomen: Soft, non-tender, non-distended with normoactive bowel sounds. No hepatomegaly. No rebound/guarding. He has incisional hernia -- soft, easily reducible. Msk:  Strength and tone normal for age. Extremities/Skin: Warm and dry.  No edema.  Neuro: Alert and oriented X 3. Moves all extremities spontaneously. Gait is normal. CNII-XII grossly in tact. Psych:  Responds to questions appropriately with a normal affect.     ASSESSMENT AND PLAN:  71 y.o. year old male with    1.  H/O Subdural hematoma [facility]) He has been scheduled for CT Head now---to be done at 4:00 at [facility] f/u with him when I get result/when study complete - CT Head Wo Contrast; Future  2. Chronic systolic CHF (congestive heart failure) [facility]) --Per  Cardiology. Saw Dr. Swaziland 07/19/2015  3. Ischemic cardiomyopathy --Per Cardiology. Saw Dr. Swaziland 07/19/2015   4. Coronary artery disease involving native coronary artery of native heart without angina pectoris --Per Cardiology. Saw Dr. Swaziland 07/19/2015   5. Pulmonary emphysema, unspecified emphysema type (HCC)   6. Tobacco abuse   7. S/P CABG (coronary artery bypass graft) --Per Cardiology. Saw Dr. Swaziland 07/19/2015   8. Alcohol abuse He is not interested / motivated to decrease / cease - COMPLETE METABOLIC PANEL WITH GFR  9. Anemia of chronic disease Will check lab to monitor - CBC with Differential/Platelet  10. Hyponatremia Will check lab to monitor. - COMPLETE METABOLIC PANEL WITH GFR  11. Syncope, unspecified syncope type Obtain CT to evaluate for any bleed or other trauma secondary to trauma to the head.  Psych check labs to evaluate for contributing factors to the syncope.  - CBC with Differential/Platelet - COMPLETE METABOLIC PANEL WITH GFR - TSH - CT Head Wo Contrast; Future  12. Injury of head, initial encounter - CT Head Wo Contrast; Future  13. Fatigue, unspecified type - TSH  Signed, [address] [place], [place], [facility] 07/13/2016 2:34 PM

## 2016-07-14 LAB — CBC WITH DIFFERENTIAL/PLATELET
Basophils Absolute: 0 cells/uL (ref 0–200)
Basophils Relative: 0 %
Eosinophils Absolute: 0 cells/uL — ABNORMAL LOW (ref 15–500)
Eosinophils Relative: 0 %
HEMATOCRIT: 42.7 % (ref 38.5–50.0)
Hemoglobin: 14.4 g/dL (ref 13.0–17.0)
LYMPHS PCT: 12 %
Lymphs Abs: 1860 cells/uL (ref 850–3900)
MCH: 31.4 pg (ref 27.0–33.0)
MCHC: 33.7 g/dL (ref 32.0–36.0)
MCV: 93.2 fL (ref 80.0–100.0)
MONO ABS: 1705 {cells}/uL — AB (ref 200–950)
MPV: 9.5 fL (ref 7.5–12.5)
Monocytes Relative: 11 %
NEUTROS ABS: 11935 {cells}/uL — AB (ref 1500–7800)
Neutrophils Relative %: 77 %
PLATELETS: 215 10*3/uL (ref 140–400)
RBC: 4.58 MIL/uL (ref 4.20–5.80)
RDW: 14.3 % (ref 11.0–15.0)
WBC: 15.5 10*3/uL — AB (ref 3.8–10.8)

## 2016-07-14 LAB — COMPLETE METABOLIC PANEL WITH GFR
ALT: 83 U/L — AB (ref 9–46)
AST: 85 U/L — AB (ref 10–35)
Albumin: 3.5 g/dL — ABNORMAL LOW (ref 3.6–5.1)
Alkaline Phosphatase: 216 U/L — ABNORMAL HIGH (ref 40–115)
BUN: 48 mg/dL — AB (ref 7–25)
CALCIUM: 8.3 mg/dL — AB (ref 8.6–10.3)
CHLORIDE: 90 mmol/L — AB (ref 98–110)
CO2: 26 mmol/L (ref 20–31)
CREATININE: 1.59 mg/dL — AB (ref 0.70–1.18)
GFR, Est African American: 50 mL/min — ABNORMAL LOW (ref >=60)
GFR, Est Non African American: 43 mL/min — ABNORMAL LOW (ref >=60)
Glucose, Bld: 107 mg/dL — ABNORMAL HIGH (ref 70–99)
Potassium: 4.1 mmol/L (ref 3.5–5.3)
Sodium: 131 mmol/L — ABNORMAL LOW (ref 135–146)
TOTAL PROTEIN: 7 g/dL (ref 6.1–8.1)
Total Bilirubin: 2.4 mg/dL — ABNORMAL HIGH (ref 0.2–1.2)

## 2016-07-14 LAB — HEPATITIS PANEL, ACUTE
HCV Ab: NEGATIVE
HEP A IGM: NONREACTIVE
Hep B C IgM: NONREACTIVE
Hepatitis B Surface Ag: NEGATIVE

## 2016-07-14 LAB — TSH: TSH: 0.9 mIU/L (ref 0.40–4.50)

## 2016-07-14 NOTE — Telephone Encounter (Signed)
I called her with results on 07/13/2016

## 2016-07-17 ENCOUNTER — Other Ambulatory Visit: Payer: Self-pay

## 2016-07-17 DIAGNOSIS — R7989 Other specified abnormal findings of blood chemistry: Secondary | ICD-10-CM

## 2016-07-17 DIAGNOSIS — R945 Abnormal results of liver function studies: Principal | ICD-10-CM

## 2016-07-19 ENCOUNTER — Encounter: Payer: Self-pay | Admitting: Family Medicine

## 2016-07-19 ENCOUNTER — Ambulatory Visit (INDEPENDENT_AMBULATORY_CARE_PROVIDER_SITE_OTHER): Payer: Medicare Other | Admitting: Physician Assistant

## 2016-07-19 VITALS — BP 102/64 | HR 95 | Temp 97.7°F | Resp 16 | Wt 148.4 lb

## 2016-07-19 DIAGNOSIS — R05 Cough: Secondary | ICD-10-CM | POA: Diagnosis not present

## 2016-07-19 DIAGNOSIS — R3989 Other symptoms and signs involving the genitourinary system: Secondary | ICD-10-CM | POA: Diagnosis not present

## 2016-07-19 DIAGNOSIS — D72829 Elevated white blood cell count, unspecified: Secondary | ICD-10-CM | POA: Diagnosis not present

## 2016-07-19 DIAGNOSIS — R059 Cough, unspecified: Secondary | ICD-10-CM

## 2016-07-19 DIAGNOSIS — I255 Ischemic cardiomyopathy: Secondary | ICD-10-CM | POA: Diagnosis not present

## 2016-07-19 DIAGNOSIS — J439 Emphysema, unspecified: Secondary | ICD-10-CM

## 2016-07-19 LAB — URINALYSIS, MICROSCOPIC ONLY
BACTERIA UA: NONE SEEN [HPF]
Casts: NONE SEEN [LPF]
Crystals: NONE SEEN [HPF]
WBC UA: NONE SEEN WBC/HPF (ref <=5)
YEAST: NONE SEEN [HPF]

## 2016-07-19 LAB — URINALYSIS, ROUTINE W REFLEX MICROSCOPIC
BILIRUBIN URINE: NEGATIVE
GLUCOSE, UA: NEGATIVE
KETONES UR: NEGATIVE
Leukocytes, UA: NEGATIVE
Nitrite: NEGATIVE
PROTEIN: NEGATIVE
Specific Gravity, Urine: 1.03 (ref 1.001–1.035)
pH: 5.5 (ref 5.0–8.0)

## 2016-07-19 MED ORDER — ALBUTEROL SULFATE HFA 108 (90 BASE) MCG/ACT IN AERS: 2.0000 | INHALATION_SPRAY | Freq: Four times a day (QID) | RESPIRATORY_TRACT | 5 refills | Status: DC | PRN

## 2016-07-19 MED ORDER — AZITHROMYCIN 250 MG PO TABS: ORAL_TABLET | ORAL | 0 refills | Status: DC

## 2016-07-20 NOTE — Progress Notes (Signed)
Patient ID: Cory Dennis MRN: [id number], DOB: 05/22/1946, 71 y.o. Date of Encounter: 07/20/2016, 1:58 PM    Chief Complaint:  Chief Complaint  Patient presents with  . high white blood cells     HPI: 71 y.o. year old white male --   11/15/2015: here today with a younger white male. I asked if she is his daughter-in-law. She responds that no, she isnt the same one that was here with him at prior visit. She says that they were not taking care of him and when she went to check on him in December and found the horrible condition they had him living in, she started helping care for him.   I aksed if she has witnessed any syncopal episiodes. She says no but "when she wnet up there Thursday, he looked really pale" and she asked what was wrong with him and found out that he had had a coughing spell an dpassed out. I asked where he was at tht time  When she says "up there"--she says at the bar.  I ask how much he is drinking--he says 5-6. She says 6-10----meaning beers per day. I asked about additional alcohol---he says "occasional shot" and she adds "supposed to be 3 or less--I have told him not to have more than 3".   Discussed with them that--today I reviewed my prior OV note from 03/2015---at that time had reviewed that he has known h/o hyponatremia, that alcohol cna cause hyponatremia. Also discussed that he was not being compliant with his COPD meds.   Again today--he says he is not using either Spiriva or Symbicort but is aware he is supposed to.   Discussed with both of them that the first step in evaluating/treating this is reduced alcohol intake and compliance with COPD medicaitons.    07/13/2016: He is here for visit today accompanied by 2 females. They report that he has recently had 2 syncopal episodes within 9 days of each other. Says that one of them occurred on the day of the [place]. He was at the bar and coughed and had syncope. Second time he was at the bar and had  syncope. EMS was called but patient refused to go to the hospital. One of the females states that EMS said the blood pressure was 88/44 when EMS checked it and she says that she continued to monitor blood pressure and it took about 2 hours for it to get back to his normal. She also states that when EMS evaluated him his blood sugar was normal at 149 and his oxygen saturation was excellent. They note that he has had surgery to his head and they're concerned that he could have another blood clot.   They report that he continues to smoke a lot and continues to drink a lot of alcohol.  Nurse had noted the patient had had some vomiting and diarrhea. Patient states that he only vomited once and that the diarrhea resolved a week ago.  AT OV 07/13/2016----- Obtained Head CT---showed no acute findings.  Obtained Labs----Pt to schedule f/u OV  07/19/2016: Today he is here for follow-up from last visit and from last visit's lab results. At that time labs were significant for WBC of 15.5. Sodium 131,  chloride 90, BUN 48,  creatinine 1.59. LFTs were elevated with total bili 2.4,  alkaline phosphatase 216,  AST 85,  and ALT 83. Hepatitis panel was added and is negative. He was then scheduled for right upper quadrant ultrasound  which is scheduled for the upcoming week.  Had him follow-up for office visit today so we could check a urinalysis and evaluate for etiology of his elevated white count. He is a smoker and has COPD but patient reports he does not have any increased amount of cough compared to usual. Has chronic cough. He reports that he has had no ear ache no mucus from the nose no sore throat no abdominal pain no nausea vomiting or diarrhea. Has had no fevers or chills.  Home Meds:   Outpatient Medications Prior to Visit  Medication Sig Dispense Refill  . aspirin 81 MG tablet Take 81 mg by mouth daily.    . folic acid (FOLVITE) 1 MG tablet Take 1 tablet (1 mg total) by mouth daily. 30 tablet 6  .  furosemide (LASIX) 40 MG tablet Take 1 tablet (40 mg total) by mouth 2 (two) times daily. <PLEASE MAKE APPOINTMENT FOR REFILLS> 180 tablet 0  . HYDROcodone-acetaminophen (LORTAB) 10-500 MG per tablet Take 1 tablet by mouth every 6 (six) hours as needed for pain. Reported on 11/15/2015    . ipratropium-albuterol (DUONEB) 0.5-2.5 (3) MG/3ML SOLN Take 3 mLs by nebulization every 4 (four) hours. (Patient not taking: Reported on 11/15/2015) 360 mL 1  . lisinopril (PRINIVIL,ZESTRIL) 10 MG tablet TAKE 1 TABLET BY MOUTH EVERY DAY 30 tablet 7  . metoprolol tartrate (LOPRESSOR) 25 MG tablet TAKE 1 TABLET TWICE A DAY 60 tablet 8  . tiotropium (SPIRIVA HANDIHALER) 18 MCG inhalation capsule Place 1 capsule (18 mcg total) into inhaler and inhale daily. 30 capsule 12  . albuterol (PROVENTIL HFA;VENTOLIN HFA) 108 (90 BASE) MCG/ACT inhaler Inhale 2 puffs into the lungs every 6 (six) hours as needed for shortness of breath.      No facility-administered medications prior to visit.     Allergies:  Allergies  Allergen Reactions  . Penicillins Other (See Comments)    Doesn't remember  Has patient had a PCN reaction causing immediate rash, facial/tongue/throat swelling, SOB or lightheadedness with hypotension: unknown Has patient had a PCN reaction causing severe rash involving mucus membranes or skin necrosis: unknown Has patient had a PCN reaction that required hospitalization unknown Has patient had a PCN reaction occurring within the last 10 years: unknown If all of the above answers are "NO", then may proceed with Cephalosporin use.       Review of Systems: See HPI for pertinent ROS. All other ROS negative.    Physical Exam: Blood pressure 102/64, pulse 95, temperature 97.7 F (36.5 C), temperature source Oral, resp. rate 16, weight 148 lb 6.4 oz (67.3 kg), SpO2 96 %., Body mass index is 25.47 kg/m. General: WM.  Appears in no acute distress. Neck: Supple. No thyromegaly. No lymphadenopathy.No carotid  bruit.  Lungs: Breath sounds are distant and decreased. But no wheezes, rhonchi, rales.  Heart: Regular rhythm. No murmurs, rubs, or gallops. Abdomen: Soft, non-tender, non-distended with normoactive bowel sounds. No hepatomegaly. No rebound/guarding. He has incisional hernia -- soft, easily reducible. Msk:  Strength and tone normal for age. Extremities/Skin: Warm and dry.  No edema.  Neuro: Alert and oriented X 3. Moves all extremities spontaneously. Gait is normal. CNII-XII grossly in tact. Psych:  Responds to questions appropriately with a normal affect.     ASSESSMENT AND PLAN:  71 y.o. year old male with    1. Leukocytosis, unspecified type Urinanalysis performed today is normal. No signs of urinary tract infection. Obtain chest x-ray. Given his cough will treat with  azithromycin. - DG Chest 2 View; Future - azithromycin (ZITHROMAX) 250 MG tablet; Day 1: Take 2 daily. Days 2 -5: Take 1 daily.  Dispense: 6 tablet; Refill: 0  2. Abnormal urine color - Urinalysis, Routine w reflex microscopic  3. Pulmonary emphysema, unspecified emphysema type (HCC) - azithromycin (ZITHROMAX) 250 MG tablet; Day 1: Take 2 daily. Days 2 -5: Take 1 daily.  Dispense: 6 tablet; Refill: 0 - albuterol (PROVENTIL HFA;VENTOLIN HFA) 108 (90 Base) MCG/ACT inhaler; Inhale 2 puffs into the lungs every 6 (six) hours as needed for shortness of breath.  Dispense: 1 Inhaler; Refill: 5  4. Cough - azithromycin (ZITHROMAX) 250 MG tablet; Day 1: Take 2 daily. Days 2 -5: Take 1 daily.  Dispense: 6 tablet; Refill: 0   H/O Subdural hematoma (HCC) --He had Ct Head 07/13/2016--no acute findings.   Chronic systolic CHF (congestive heart failure) [facility]) --Per Cardiology. Saw Dr. Swaziland 07/19/2015  Ischemic cardiomyopathy --Per Cardiology. Saw Dr. Swaziland 07/19/2015   Coronary artery disease involving native coronary artery of native heart without angina pectoris --Per Cardiology. Saw Dr. Swaziland 07/19/2015  Tobacco  abuse  S/P CABG (coronary artery bypass graft) --Per Cardiology. Saw Dr. Swaziland 07/19/2015  Alcohol abuse He is not interested / motivated to decrease / cease Hyponatremia most likely secondary to alcohol abuse.  Elevated LFTs---he is having ultrasound this week to further evaluate  Hyponatremia He is not interested / motivated to decrease / cease Hyponatremia most likely secondary to alcohol abuse.  Elevated LFTs---he is having ultrasound this week to further evaluate    Signed, Frazier Richards, [place], [facility] 07/20/2016 1:58 PM

## 2016-07-25 ENCOUNTER — Ambulatory Visit
Admission: RE | Admit: 2016-07-25 | Discharge: 2016-07-25 | Disposition: A | Discharge status: Home / Self Care | Payer: Medicare Other | Source: Ambulatory Visit | Attending: Physician Assistant | Admitting: Physician Assistant

## 2016-07-25 DIAGNOSIS — R945 Abnormal results of liver function studies: Principal | ICD-10-CM

## 2016-07-25 DIAGNOSIS — R7989 Other specified abnormal findings of blood chemistry: Secondary | ICD-10-CM

## 2016-07-25 DIAGNOSIS — D72829 Elevated white blood cell count, unspecified: Secondary | ICD-10-CM

## 2016-07-25 DIAGNOSIS — R05 Cough: Secondary | ICD-10-CM | POA: Diagnosis not present

## 2016-07-27 ENCOUNTER — Other Ambulatory Visit: Payer: Self-pay | Admitting: Cardiology

## 2016-07-27 NOTE — Telephone Encounter (Signed)
REFILL 

## 2016-07-28 ENCOUNTER — Telehealth: Payer: Self-pay

## 2016-07-28 NOTE — Telephone Encounter (Signed)
Melissa called to get the  U/S and chest X- ray results of Cory Dennis

## 2016-08-01 ENCOUNTER — Other Ambulatory Visit: Payer: Self-pay

## 2016-08-01 MED ORDER — LISINOPRIL 10 MG PO TABS
10.0000 mg | ORAL_TABLET | Freq: Every day | ORAL | 1 refills | Status: DC
Start: 2016-08-01 — End: 2016-12-24

## 2016-10-17 ENCOUNTER — Encounter: Payer: Self-pay | Admitting: Physician Assistant

## 2016-10-26 ENCOUNTER — Other Ambulatory Visit: Payer: Self-pay | Admitting: Cardiology

## 2016-10-26 NOTE — Telephone Encounter (Signed)
REFILL 

## 2016-11-27 ENCOUNTER — Other Ambulatory Visit: Payer: Self-pay | Admitting: Cardiology

## 2016-12-18 ENCOUNTER — Other Ambulatory Visit: Payer: Self-pay | Admitting: Cardiology

## 2016-12-24 ENCOUNTER — Other Ambulatory Visit: Payer: Self-pay | Admitting: Cardiology

## 2016-12-25 ENCOUNTER — Ambulatory Visit: Payer: Medicare Other | Admitting: Physician Assistant

## 2017-01-16 ENCOUNTER — Other Ambulatory Visit: Payer: Self-pay | Admitting: Cardiology

## 2017-01-26 ENCOUNTER — Other Ambulatory Visit: Payer: Self-pay | Admitting: Cardiology

## 2017-01-26 ENCOUNTER — Other Ambulatory Visit: Payer: Self-pay | Admitting: Cardiovascular Disease

## 2017-02-03 ENCOUNTER — Other Ambulatory Visit: Payer: Self-pay | Admitting: Cardiology

## 2017-02-05 ENCOUNTER — Other Ambulatory Visit: Payer: Self-pay | Admitting: Cardiology

## 2017-02-05 MED ORDER — LISINOPRIL 10 MG PO TABS: 10.0000 mg | ORAL_TABLET | Freq: Every day | ORAL | 1 refills | Status: DC

## 2017-02-05 NOTE — Telephone Encounter (Signed)
New Message      *STAT* If patient is at the pharmacy, call can be transferred to refill team.   1. Which medications need to be refilled? (please list name of each medication and dose if known)  lisinopril (PRINIVIL,ZESTRIL) 10 MG tablet TAKE 1 TABLET BY MOUTH EVERY DAY. APPT NEEDED FOR FURTHER REFILLS    2. Which pharmacy/location (including street and city if local pharmacy) is medication to be sent to? [facility] [address]   3. Do they need a 30 day or 90 day supply? 14 days worth  Has appt on 02/19/17

## 2017-02-05 NOTE — Telephone Encounter (Signed)
Rx(s) sent to pharmacy electronically.  

## 2017-02-08 ENCOUNTER — Other Ambulatory Visit: Payer: Self-pay | Admitting: Cardiology

## 2017-02-09 ENCOUNTER — Other Ambulatory Visit: Payer: Self-pay | Admitting: *Deleted

## 2017-02-09 MED ORDER — FOLIC ACID 1 MG PO TABS: ORAL_TABLET | ORAL | 0 refills | Status: DC

## 2017-02-16 ENCOUNTER — Other Ambulatory Visit: Payer: Self-pay | Admitting: Cardiology

## 2017-02-18 NOTE — Progress Notes (Signed)
Cardiology Office Note    Date:  02/19/2017   ID:  Cory Dennis 1946-01-28, MRN [id number]  PCP:  Cory Bodo, PA-C  Cardiologist: Dr. Swaziland   Chief Complaint  Patient presents with  . Follow-up    History of Present Illness:    Cory Dennis is a 71 y.o. male with past medical history of CAD (s/p CABG in 2011 with LIMA-LAD, RIMA-distal RCA, and sequential SVG-OM1/OM 2 complicated by nonunion of sternum), ischemic cardiomyopathy (declined ICD placement), chronic combined systolic and diastolic CHF (EF 40% by echo in 07/2014), HTN, HLD, COPD, and tobacco use who presents to the office today for annual follow-up.   He was last examined by Dr. Swaziland in 06/2015 and reported doing well from a cardiac perspective at that time, denying any chest pain or dyspnea on exertion. He did report a recent admission for a subdural hematoma but was progressing well.  In talking with the patient today, he reports doing well since his last office visit. He does not exercise regularly but is active around his home as he performs all the landscaping work and also cut down a tree just last week. He denies any anginal symptoms with this. No recent chest pain, orthopnea, PND, lower extremity edema, or palpitations. Does have baseline dyspnea on exertion but denies any acute worsening of his symptoms.  He reports good compliance with his medication regimen including Lasix. He does not weigh daily but reports trying to limit his sodium intake as much as possible. He still smokes one pack per day and is not interested in trying to quit. Still consumes 5-6 beers per day.    Past Medical History:  Diagnosis Date  . Alcohol abuse   . Anemia   . Anxiety   . Arthritis   . CAD (coronary artery disease)    a. s/p CABG in 2011 with LIMA-LAD, RIMA-distal RCA, and sequential SVG-OM1/OM 2 complicated by nonunion of sternum)  . CHF (congestive heart failure) (HCC)    a. EF 25% by echo in 07/2014.  Declined ICD placement.   . Chronic systolic CHF (congestive heart failure) (HCC) 11/06/2014  . COPD (chronic obstructive pulmonary disease) (HCC)   . Depression   . Emphysema of lung (HCC)   . Fatigue   . Hypertension   . Ischemic cardiomyopathy   . Musculoskeletal chest pain   . Myocardial infarction (HCC)   . SOB (shortness of breath)   . Syncope   . Tobacco abuse     Past Surgical History:  Procedure Laterality Date  . CORONARY ARTERY BYPASS GRAFT  2011   4v CABG [facility] in [place] in 06/2009 with LIMA to LAD, RIMA to distal RCA, sequential SVG to OM1/OM 2  . CRANIOTOMY Left 05/05/2015   Procedure: Left CRANIOTOMY HEMATOMA EVACUATION SUBDURAL;  Surgeon: Tia Alert, MD;  Location: [facility];  Service: Neurosurgery;  Laterality: Left;  . Left ventricular aneurysm repair      Current Medications: Outpatient Medications Prior to Visit  Medication Sig Dispense Refill  . albuterol (PROVENTIL HFA;VENTOLIN HFA) 108 (90 Base) MCG/ACT inhaler Inhale 2 puffs into the lungs every 6 (six) hours as needed for shortness of breath. 1 Inhaler 5  . aspirin 81 MG tablet Take 81 mg by mouth daily.    . folic acid (FOLVITE) 1 MG tablet TAKE 1 TABLET BY MOUTH EVERY DAY. APPT NEEDED FOR FURTHER REFILLS 15 tablet 0  . HYDROcodone-acetaminophen (LORTAB) 10-500 MG per tablet Take  1 tablet by mouth every 6 (six) hours as needed for pain. Reported on 11/15/2015    . ipratropium-albuterol (DUONEB) 0.5-2.5 (3) MG/3ML SOLN Take 3 mLs by nebulization every 4 (four) hours. 360 mL 1  . tiotropium (SPIRIVA HANDIHALER) 18 MCG inhalation capsule Place 1 capsule (18 mcg total) into inhaler and inhale daily. 30 capsule 12  . furosemide (LASIX) 40 MG tablet TAKE 1 TABLET BY MOUTH TWICE A DAY 30 tablet 0  . lisinopril (PRINIVIL,ZESTRIL) 10 MG tablet Take 1 tablet (10 mg total) by mouth daily. 30 tablet 1  . metoprolol tartrate (LOPRESSOR) 25 MG tablet TAKE 1 TABLET BY MOUTH TWICE DAILY 60 tablet 0  .  azithromycin (ZITHROMAX) 250 MG tablet Day 1: Take 2 daily. Days 2 -5: Take 1 daily. (Patient not taking: Reported on 02/19/2017) 6 tablet 0   No facility-administered medications prior to visit.      Allergies:   Penicillins   Social History   Social History  . Marital status: Single    Spouse name: N/A  . Number of children: N/A  . Years of education: N/A   Occupational History  . Mechanic    Social History Main Topics  . Smoking status: Current Every Day Smoker    Packs/day: 0.25    Years: 42.00    Types: Cigarettes  . Smokeless tobacco: Never Used     Comment: working on it  . Alcohol use 12.0 - 14.4 oz/week    20 - 24 Cans of beer per week     Comment: 5-6 beers per day  . Drug use: No  . Sexual activity: Not Currently   Other Topics Concern  . None   Social History Narrative  . None     Family History:  The patient's family history is unknown by patient.   Review of Systems:   Please see the history of present illness.     General:  No chills, fever, night sweats or weight changes.  Cardiovascular:  No chest pain, edema, orthopnea, palpitations, paroxysmal nocturnal dyspnea. Positive for dyspnea on exertion.  Dermatological: No rash, lesions/masses Respiratory: No cough, dyspnea Urologic: No hematuria, dysuria Abdominal:   No nausea, vomiting, diarrhea, bright red blood per rectum, melena, or hematemesis Neurologic:  No visual changes, wkns, changes in mental status. All other systems reviewed and are otherwise negative except as noted above.   Physical Exam:    VS:  BP 134/72 (BP Location: Right Arm)   Pulse 64   Ht  (1.626 m)   Wt 162 lb 6.4 oz (73.7 kg)   BMI 27.88 kg/m    General: Well developed, elderly Caucasian male appearing in no acute distress. Head: Normocephalic, atraumatic, sclera non-icteric, no xanthomas, nares are without discharge.  Neck: No carotid bruits. JVD not elevated.  Lungs: Respirations regular and unlabored, without  wheezes or rales.  Heart: Regular rate and rhythm. No S3 or S4.  No murmur, no rubs, or gallops appreciated. Sternal scar noted.  Abdomen: Soft, non-tender, non-distended with normoactive bowel sounds. No hepatomegaly. No rebound/guarding. No obvious abdominal masses. Msk:  Strength and tone appear normal for age. No joint deformities or effusions. Extremities: No clubbing or cyanosis. No lower extremity edema.  Distal pedal pulses are 2+ bilaterally. Neuro: Alert and oriented X 3. Moves all extremities spontaneously. No focal deficits noted. Psych:  Responds to questions appropriately with a normal affect. Skin: No rashes or lesions noted  Wt Readings from Last 3 Encounters:  02/19/17 162 lb  6.4 oz (73.7 kg)  07/19/16 148 lb 6.4 oz (67.3 kg)  07/13/16 152 lb 3.2 oz (69 kg)     Studies/Labs Reviewed:   EKG:  EKG is not ordered today.  The ekg ordered today demonstrates NSR, HR 64, with lateral TWI (similar to prior tracings).  h   Recent Labs: 07/13/2016: ALT 83; BUN 48; Creat 1.59; Hemoglobin 14.4; Platelets 215; Potassium 4.1; Sodium 131; TSH 0.90   Lipid Panel    Component Value Date/Time   CHOL 169 06/24/2014 1400   TRIG 48 06/24/2014 1400   HDL 70 06/24/2014 1400   CHOLHDL 2.4 06/24/2014 1400   VLDL 10 06/24/2014 1400   LDLCALC 89 06/24/2014 1400    Additional studies/ records that were reviewed today include:   Echocardiogram: 07/2014 Study Conclusions  - Left ventricle: The cavity size was mildly dilated. Wall thickness was normal. The estimated ejection fraction was 25%. The apical septal wall and true apex were akinetic. The inferolateral wall was akinetic. The basal and apical inferior wall segments were akinetic. The mid to apical anterolateral wall was akinetic. Definity contrast was used, no LV thrombus visualized. Features are consistent with a pseudonormal left ventricular filling pattern, with concomitant abnormal relaxation and increased  filling pressure (grade 2 diastolic dysfunction). E/medial e&' > 15 suggests LV end diastolic pressure at least 20 mmHg. - Aortic valve: There was no stenosis. - Mitral valve: Mildly calcified annulus. Mildly calcified leaflets . There was mild regurgitation. - Left atrium: The atrium was moderately dilated. - Right ventricle: Poorly visualized. The cavity size was normal. Systolic function was mildly reduced. - Tricuspid valve: Peak RV-RA gradient (S): 41 mm Hg. - Pulmonary arteries: PA peak pressure: 44 mm Hg (S). - Inferior vena cava: The vessel was normal in size. The respirophasic diameter changes were in the normal range (>= 50%), consistent with normal central venous pressure.  Impressions:  - Mildly dilated LV with EF 25%. Wall motion abnormalities as noted above. No LV thrombus noted with Definity. Moderate diastolic dysfunction with evidence for elevated LV filling pressure. Normal RV size with mildly decreased systolic function. The IVC was not dilated. Mild pulmonary hypertension.  Assessment:    1. Coronary artery disease involving coronary bypass graft of native heart without angina pectoris   2. Chronic combined systolic and diastolic heart failure (HCC)   3. Ischemic cardiomyopathy   4. Hyperlipidemia LDL goal <70   5. Essential hypertension   6. Tobacco abuse   7. Alcohol abuse      Plan:   In order of problems listed above:  1. CAD - s/p CABG in 2011 with LIMA-LAD, RIMA-distal RCA, and sequential SVG-OM1/OM 2 complicated by nonunion of sternum - he denies any recent chest pain. Has baseline dyspnea on exertion but denies any acute worsening of his symptoms.  - continue ASA and BB. Unclear why he is not on statin therapy. Recheck FLP today and initiate if not at goal.  2. Chronic Combined Systolic and Diastolic CHF/ Ischemic Cardiomyopathy - EF 25% by echo in 2016. He has declined ICD placement in the past. - he does not appear volume  overloaded by physical examination. Currently on Lasix  BID, Lisinopril  daily, and Lopressor  BID. Would like to switch to [facility] but he is not interesting in switching any of his medications as "they have been working well for so long".   3. HLD - Lipid Panel in 05/2014 showed total cholesterol of 169, HDL 70, and LDL 89. Not  at goal of LDL < 70. - it is unclear why he is not on statin therapy as he is unsure if he tolerated these in the past. Will recheck FLP and LFT's today. If not at goal, recommend initiation of Atorvastatin 20mg  daily.   4. HTN - BP is well-controlled at 134/72 during today's visit. - continue Lisinopril 10mg  daily and Lopressor 25mg  BID.   5. Tobacco Use - continues to smokes 1+ ppd. Cessation advised but he is not interested in quitting.   6. Alcohol Abuse - consumes 5-6 beers daily. Recommended limiting this to < 2 drinks per day.   Medication Adjustments/Labs and Tests Ordered: Current medicines are reviewed at length with the patient today.  Concerns regarding medicines are outlined above.  Medication changes, Labs and Tests ordered today are listed in the Patient Instructions below. Patient Instructions  Medication Instructions:  Continue current medications  If you need a refill on your cardiac medications before your next appointment, please call your pharmacy.  Labwork: CMP and Fasting Lipids HERE IN OUR OFFICE AT [facility]  Testing/Procedures: None Ordered  Follow-Up: Your physician wants you to follow-up in: 1 Year with Dr Swaziland. You should receive a reminder letter in the mail two months in advance. If you do not receive a letter, please call our office [phone].    Thank you for choosing [facility] at [employer], [place], [place]  02/19/2017 12:53 PM    [facility] [address] [place], [address] [place], [place]  [address] Phone: [phone]; Fax: [phone]  [address] [place], [place] [address] Phone: [phone]

## 2017-02-19 ENCOUNTER — Ambulatory Visit (INDEPENDENT_AMBULATORY_CARE_PROVIDER_SITE_OTHER): Payer: Medicare Other | Admitting: Student

## 2017-02-19 ENCOUNTER — Encounter: Payer: Self-pay | Admitting: Student

## 2017-02-19 VITALS — BP 134/72 | HR 64 | Ht 64.0 in | Wt 162.4 lb

## 2017-02-19 DIAGNOSIS — Z720 Tobacco use: Secondary | ICD-10-CM

## 2017-02-19 DIAGNOSIS — E785 Hyperlipidemia, unspecified: Secondary | ICD-10-CM

## 2017-02-19 DIAGNOSIS — I5042 Chronic combined systolic (congestive) and diastolic (congestive) heart failure: Secondary | ICD-10-CM | POA: Diagnosis not present

## 2017-02-19 DIAGNOSIS — I255 Ischemic cardiomyopathy: Secondary | ICD-10-CM

## 2017-02-19 DIAGNOSIS — I25810 Atherosclerosis of coronary artery bypass graft(s) without angina pectoris: Secondary | ICD-10-CM | POA: Diagnosis not present

## 2017-02-19 DIAGNOSIS — I10 Essential (primary) hypertension: Secondary | ICD-10-CM | POA: Diagnosis not present

## 2017-02-19 DIAGNOSIS — F1010 Alcohol abuse, uncomplicated: Secondary | ICD-10-CM

## 2017-02-19 LAB — LIPID PANEL
CHOLESTEROL TOTAL: 158 mg/dL (ref 100–199)
Chol/HDL Ratio: 2.3 ratio (ref 0.0–5.0)
HDL: 69 mg/dL (ref >39)
LDL Calculated: 79 mg/dL (ref 0–99)
TRIGLYCERIDES: 49 mg/dL (ref 0–149)
VLDL CHOLESTEROL CAL: 10 mg/dL (ref 5–40)

## 2017-02-19 LAB — COMPREHENSIVE METABOLIC PANEL
A/G RATIO: 1.6 (ref 1.2–2.2)
ALT: 34 IU/L (ref 0–44)
AST: 39 IU/L (ref 0–40)
Albumin: 4.1 g/dL (ref 3.5–4.8)
Alkaline Phosphatase: 114 IU/L (ref 39–117)
BILIRUBIN TOTAL: 0.3 mg/dL (ref 0.0–1.2)
BUN/Creatinine Ratio: 17 (ref 10–24)
BUN: 19 mg/dL (ref 8–27)
CHLORIDE: 96 mmol/L (ref 96–106)
CO2: 23 mmol/L (ref 20–29)
Calcium: 9.4 mg/dL (ref 8.6–10.2)
Creatinine, Ser: 1.09 mg/dL (ref 0.76–1.27)
GFR calc Af Amer: 79 mL/min/{1.73_m2} (ref >59)
GFR calc non Af Amer: 68 mL/min/{1.73_m2} (ref >59)
GLUCOSE: 109 mg/dL — AB (ref 65–99)
Globulin, Total: 2.5 g/dL (ref 1.5–4.5)
POTASSIUM: 5.2 mmol/L (ref 3.5–5.2)
Sodium: 132 mmol/L — ABNORMAL LOW (ref 134–144)
Total Protein: 6.6 g/dL (ref 6.0–8.5)

## 2017-02-19 MED ORDER — FUROSEMIDE 40 MG PO TABS: 40.0000 mg | ORAL_TABLET | Freq: Two times a day (BID) | ORAL | 3 refills | Status: DC

## 2017-02-19 MED ORDER — METOPROLOL TARTRATE 25 MG PO TABS: 25.0000 mg | ORAL_TABLET | Freq: Two times a day (BID) | ORAL | 3 refills | Status: DC

## 2017-02-19 MED ORDER — LISINOPRIL 10 MG PO TABS: 10.0000 mg | ORAL_TABLET | Freq: Every day | ORAL | 3 refills | Status: DC

## 2017-02-19 NOTE — Patient Instructions (Signed)
Medication Instructions:  Continue current medications  If you need a refill on your cardiac medications before your next appointment, please call your pharmacy.  Labwork: CMP and Fasting Lipids HERE IN OUR OFFICE AT [facility]  Testing/Procedures: None Ordered  Follow-Up: Your physician wants you to follow-up in: 1 Year with Dr Swaziland. You should receive a reminder letter in the mail two months in advance. If you do not receive a letter, please call our office [phone].    Thank you for choosing [facility] at [facility]!!

## 2017-02-26 ENCOUNTER — Other Ambulatory Visit: Payer: Self-pay | Admitting: Cardiology

## 2017-02-27 ENCOUNTER — Other Ambulatory Visit: Payer: Self-pay

## 2017-02-27 MED ORDER — FOLIC ACID 1 MG PO TABS: ORAL_TABLET | ORAL | 3 refills | Status: DC

## 2017-02-28 ENCOUNTER — Ambulatory Visit (INDEPENDENT_AMBULATORY_CARE_PROVIDER_SITE_OTHER): Payer: Medicare Other | Admitting: Physician Assistant

## 2017-02-28 ENCOUNTER — Encounter: Payer: Self-pay | Admitting: Physician Assistant

## 2017-02-28 VITALS — BP 138/78 | HR 82 | Temp 97.9°F | Resp 16 | Ht 64.0 in | Wt 161.0 lb

## 2017-02-28 DIAGNOSIS — Z23 Encounter for immunization: Secondary | ICD-10-CM | POA: Diagnosis not present

## 2017-02-28 DIAGNOSIS — R5383 Other fatigue: Secondary | ICD-10-CM

## 2017-02-28 DIAGNOSIS — I255 Ischemic cardiomyopathy: Secondary | ICD-10-CM

## 2017-02-28 NOTE — Progress Notes (Signed)
Patient ID: Cory Dennis MRN: [id number], DOB: 1945/07/05, 71 y.o. Date of Encounter: 02/28/2017, 2:24 PM    Chief Complaint:  Chief Complaint  Patient presents with  . Fatigue    states that he has no energy and would like testosterone check     HPI: 71 y.o. year old male presents with above.   I discussed with him that even if we checked his testosterone level and it was low, that I would not recommend testosterone repletion for him given his cardiovascular history, age etc.  He states that the only thing he wanted evaluated today was the fact that he is feeling tired. Says that he "doesn't have any get up and go".  Has no other concerns to address today.     Home Meds:   Outpatient Medications Prior to Visit  Medication Sig Dispense Refill  . albuterol (PROVENTIL HFA;VENTOLIN HFA) 108 (90 Base) MCG/ACT inhaler Inhale 2 puffs into the lungs every 6 (six) hours as needed for shortness of breath. 1 Inhaler 5  . aspirin 81 MG tablet Take 81 mg by mouth daily.    . folic acid (FOLVITE) 1 MG tablet TAKE 1 TABLET BY MOUTH EVERY DAY. APPT NEEDED FOR FURTHER REFILLS 90 tablet 3  . furosemide (LASIX) 40 MG tablet Take 1 tablet (40 mg total) by mouth 2 (two) times daily. 180 tablet 3  . HYDROcodone-acetaminophen (LORTAB) 10-500 MG per tablet Take 1 tablet by mouth every 6 (six) hours as needed for pain. Reported on 11/15/2015    . ipratropium-albuterol (DUONEB) 0.5-2.5 (3) MG/3ML SOLN Take 3 mLs by nebulization every 4 (four) hours. 360 mL 1  . lisinopril (PRINIVIL,ZESTRIL) 10 MG tablet Take 1 tablet (10 mg total) by mouth daily. 90 tablet 3  . metoprolol tartrate (LOPRESSOR) 25 MG tablet Take 1 tablet (25 mg total) by mouth 2 (two) times daily. 180 tablet 3  . tiotropium (SPIRIVA HANDIHALER) 18 MCG inhalation capsule Place 1 capsule (18 mcg total) into inhaler and inhale daily. 30 capsule 12   No facility-administered medications prior to visit.     Allergies:  Allergies    Allergen Reactions  . Penicillins Other (See Comments)    Doesn't remember  Has patient had a PCN reaction causing immediate rash, facial/tongue/throat swelling, SOB or lightheadedness with hypotension: unknown Has patient had a PCN reaction causing severe rash involving mucus membranes or skin necrosis: unknown Has patient had a PCN reaction that required hospitalization unknown Has patient had a PCN reaction occurring within the last 10 years: unknown If all of the above answers are "NO", then may proceed with Cephalosporin use.       Review of Systems: See HPI for pertinent ROS. All other ROS negative.    Physical Exam: Blood pressure 138/78, pulse 82, temperature 97.9 F (36.6 C), temperature source Oral, resp. rate 16, height  (1.626 m), weight 73 kg (161 lb), SpO2 97 %., Body mass index is 27.64 kg/m. General:  WM. Appears in no acute distress. Neck: Supple. No thyromegaly. No lymphadenopathy. No carotid bruit. Lungs: Clear bilaterally to auscultation without wheezes, rales, or rhonchi. Breathing is unlabored. Heart: Regular rhythm. No murmurs, rubs, or gallops. Msk:  Strength and tone normal for age. Extremities/Skin: Warm and dry.  No LE edema.  Neuro: Alert and oriented X 3. Moves all extremities spontaneously. Gait is normal. CNII-XII grossly in tact. Psych:  Responds to questions appropriately with a normal affect.     ASSESSMENT AND PLAN:  71  y.o. year old male with  1. Fatigue, unspecified type He just had CME T checked with cardiology 02/19/17 so will not repeat this. Will not check testosterone level given his age and cardiovascular history. - CBC with Differential/Platelet - TSH   Signed, [address] [place], [place], [facility] 02/28/2017 2:24 PM

## 2017-03-01 ENCOUNTER — Telehealth: Payer: Self-pay | Admitting: *Deleted

## 2017-03-01 LAB — CBC WITH DIFFERENTIAL/PLATELET
Basophils Absolute: 58 cells/uL (ref 0–200)
Basophils Relative: 0.5 %
EOS PCT: 0.7 %
Eosinophils Absolute: 81 cells/uL (ref 15–500)
HCT: 41.3 % (ref 38.5–50.0)
Hemoglobin: 14 g/dL (ref 13.2–17.1)
Lymphs Abs: 3004 cells/uL (ref 850–3900)
MCH: 31.5 pg (ref 27.0–33.0)
MCHC: 33.9 g/dL (ref 32.0–36.0)
MCV: 93 fL (ref 80.0–100.0)
MPV: 10.1 fL (ref 7.5–12.5)
Monocytes Relative: 13 %
Neutro Abs: 6948 cells/uL (ref 1500–7800)
Neutrophils Relative %: 59.9 %
PLATELETS: 271 10*3/uL (ref 140–400)
RBC: 4.44 10*6/uL (ref 4.20–5.80)
RDW: 12.8 % (ref 11.0–15.0)
TOTAL LYMPHOCYTE: 25.9 %
WBC mixed population: 1508 cells/uL — ABNORMAL HIGH (ref 200–950)
WBC: 11.6 10*3/uL — AB (ref 3.8–10.8)

## 2017-03-01 LAB — TSH: TSH: 1.25 mIU/L (ref 0.40–4.50)

## 2017-03-01 NOTE — Telephone Encounter (Signed)
Received call from patient.   Requested refill on Hydrocodone.   Ok to refill?

## 2017-03-01 NOTE — Telephone Encounter (Signed)
Call placed to patient and patient made aware.   States that he is aware that he has not had refill on Hydrocodone since 2016. Reports that he only takes prescription as needed, but that prescription finally ran out.   Advised to schedule OV with PCP to discuss. Appointment scheduled.

## 2017-03-01 NOTE — Telephone Encounter (Signed)
Denied. He was just here for a visit yesterday and did not mention any pain or need for pain medicine. He has not had this prescribed since 2016.

## 2017-03-05 ENCOUNTER — Ambulatory Visit (INDEPENDENT_AMBULATORY_CARE_PROVIDER_SITE_OTHER): Payer: Medicare Other | Admitting: Physician Assistant

## 2017-03-05 ENCOUNTER — Encounter: Payer: Self-pay | Admitting: Physician Assistant

## 2017-03-05 VITALS — BP 138/88 | HR 82 | Temp 97.9°F | Resp 16 | Ht 64.0 in | Wt 160.0 lb

## 2017-03-05 DIAGNOSIS — M545 Low back pain, unspecified: Secondary | ICD-10-CM

## 2017-03-05 DIAGNOSIS — I255 Ischemic cardiomyopathy: Secondary | ICD-10-CM | POA: Diagnosis not present

## 2017-03-05 MED ORDER — TRAMADOL HCL 50 MG PO TABS: ORAL_TABLET | ORAL | 0 refills | Status: DC

## 2017-03-05 NOTE — Progress Notes (Signed)
Patient ID: Cory Dennis MRN: [id number], DOB: 1945-12-23, 71 y.o. Date of Encounter: 03/05/2017, 11:55 AM    Chief Complaint:  Chief Complaint  Patient presents with  . Medication Management    discuss pain     HPI: 71 y.o. year old male presents to discuss pain and need for medication for pain.   He states that he recently had to "get a tree off the roof" and "now is going to have to put a motor in the car." Says that when he does strenuous activity like this, it causes him to have some aches and pains-- especially across his back. Feels that he needs to have some medication on hand to use when needed for significant pain that is not controlled with otc pain med.  No other concerns to address today.     Home Meds:   Outpatient Medications Prior to Visit  Medication Sig Dispense Refill  . albuterol (PROVENTIL HFA;VENTOLIN HFA) 108 (90 Base) MCG/ACT inhaler Inhale 2 puffs into the lungs every 6 (six) hours as needed for shortness of breath. 1 Inhaler 5  . aspirin 81 MG tablet Take 81 mg by mouth daily.    . folic acid (FOLVITE) 1 MG tablet TAKE 1 TABLET BY MOUTH EVERY DAY. APPT NEEDED FOR FURTHER REFILLS 90 tablet 3  . furosemide (LASIX) 40 MG tablet Take 1 tablet (40 mg total) by mouth 2 (two) times daily. 180 tablet 3  . HYDROcodone-acetaminophen (LORTAB) 10-500 MG per tablet Take 1 tablet by mouth every 6 (six) hours as needed for pain. Reported on 11/15/2015    . ipratropium-albuterol (DUONEB) 0.5-2.5 (3) MG/3ML SOLN Take 3 mLs by nebulization every 4 (four) hours. 360 mL 1  . lisinopril (PRINIVIL,ZESTRIL) 10 MG tablet Take 1 tablet (10 mg total) by mouth daily. 90 tablet 3  . metoprolol tartrate (LOPRESSOR) 25 MG tablet Take 1 tablet (25 mg total) by mouth 2 (two) times daily. 180 tablet 3  . tiotropium (SPIRIVA HANDIHALER) 18 MCG inhalation capsule Place 1 capsule (18 mcg total) into inhaler and inhale daily. 30 capsule 12   No facility-administered medications prior  to visit.     Allergies:  Allergies  Allergen Reactions  . Penicillins Other (See Comments)    Doesn't remember  Has patient had a PCN reaction causing immediate rash, facial/tongue/throat swelling, SOB or lightheadedness with hypotension: unknown Has patient had a PCN reaction causing severe rash involving mucus membranes or skin necrosis: unknown Has patient had a PCN reaction that required hospitalization unknown Has patient had a PCN reaction occurring within the last 10 years: unknown If all of the above answers are "NO", then may proceed with Cephalosporin use.       Review of Systems: See HPI for pertinent ROS. All other ROS negative.    Physical Exam: Blood pressure 138/88, pulse 82, temperature 97.9 F (36.6 C), temperature source Oral, resp. rate 16, height  (1.626 m), weight 72.6 kg (160 lb), SpO2 95 %., Body mass index is 27.46 kg/m. General:  WNWD WM. Appears in no acute distress. Neck: Supple. No thyromegaly. No lymphadenopathy. Lungs: Clear bilaterally to auscultation without wheezes, rales, or rhonchi. Breathing is unlabored. Heart: Regular rhythm. No murmurs, rubs, or gallops. Msk:  Strength and tone normal for age. Extremities/Skin: Warm and dry.  Neuro: Alert and oriented X 3. Moves all extremities spontaneously. Gait is normal. CNII-XII grossly in tact. Psych:  Responds to questions appropriately with a normal affect.     ASSESSMENT  AND PLAN:  71 y.o. year old male with  1. Acute bilateral low back pain without sciatica He continues the tramadol as directed as needed for pain that is not controlled with over-the-counter medication. - traMADol (ULTRAM) 50 MG tablet; Take 1-2 tablets every 8 hours as needed for pain.  Dispense: 60 tablet; Refill: 0   Signed, [address] [place], [place], [facility] 03/05/2017 11:55 AM

## 2017-09-18 ENCOUNTER — Other Ambulatory Visit: Payer: Self-pay | Admitting: Physician Assistant

## 2017-09-18 DIAGNOSIS — J439 Emphysema, unspecified: Secondary | ICD-10-CM

## 2018-02-15 ENCOUNTER — Other Ambulatory Visit: Payer: Self-pay | Admitting: Cardiology

## 2018-02-26 ENCOUNTER — Other Ambulatory Visit: Payer: Self-pay | Admitting: Student

## 2018-04-01 ENCOUNTER — Telehealth: Payer: Self-pay | Admitting: Cardiology

## 2018-04-01 MED ORDER — METOPROLOL TARTRATE 25 MG PO TABS: 25.0000 mg | ORAL_TABLET | Freq: Two times a day (BID) | ORAL | 0 refills | Status: DC

## 2018-04-01 MED ORDER — FUROSEMIDE 40 MG PO TABS: 40.0000 mg | ORAL_TABLET | Freq: Two times a day (BID) | ORAL | 0 refills | Status: DC

## 2018-04-01 NOTE — Telephone Encounter (Signed)
Spoke to patient, advised he is overdue for appt.  Offered to schedule but he request to call back when he can get transportation.  Advised will refill for 30 days but needs appt.  Advised to call back to schedule.

## 2018-04-01 NOTE — Telephone Encounter (Signed)
New Message          *STAT* If patient is at the pharmacy, call can be transferred to refill team.   1. Which medications need to be refilled? (please list name of each medication and dose if known) metoprolol tartrate (LOPRESSOR) 25 MG tablet/furosemide (LASIX) 40 MG tablet  2. Which pharmacy/location (including street and city if local pharmacy) is medication to be sent to? [id number] [place], [place] - [address] [phone] (Phone) [phone] (Fax)     3. Do they need a 30 day or 90 day supply?90

## 2018-04-17 ENCOUNTER — Encounter: Payer: Medicare Other | Admitting: Family Medicine

## 2018-04-18 ENCOUNTER — Encounter: Payer: Self-pay | Admitting: Family Medicine

## 2018-04-18 ENCOUNTER — Ambulatory Visit (INDEPENDENT_AMBULATORY_CARE_PROVIDER_SITE_OTHER): Payer: Medicare Other | Admitting: Family Medicine

## 2018-04-18 VITALS — BP 136/84 | HR 89 | Temp 97.7°F | Wt 163.4 lb

## 2018-04-18 DIAGNOSIS — I2510 Atherosclerotic heart disease of native coronary artery without angina pectoris: Secondary | ICD-10-CM

## 2018-04-18 DIAGNOSIS — Z0000 Encounter for general adult medical examination without abnormal findings: Secondary | ICD-10-CM | POA: Diagnosis not present

## 2018-04-18 DIAGNOSIS — Z9181 History of falling: Secondary | ICD-10-CM

## 2018-04-18 DIAGNOSIS — Z720 Tobacco use: Secondary | ICD-10-CM | POA: Diagnosis not present

## 2018-04-18 DIAGNOSIS — I5022 Chronic systolic (congestive) heart failure: Secondary | ICD-10-CM

## 2018-04-18 DIAGNOSIS — D638 Anemia in other chronic diseases classified elsewhere: Secondary | ICD-10-CM

## 2018-04-18 DIAGNOSIS — J439 Emphysema, unspecified: Secondary | ICD-10-CM | POA: Diagnosis not present

## 2018-04-18 DIAGNOSIS — Z23 Encounter for immunization: Secondary | ICD-10-CM

## 2018-04-18 DIAGNOSIS — F1010 Alcohol abuse, uncomplicated: Secondary | ICD-10-CM

## 2018-04-18 DIAGNOSIS — Z125 Encounter for screening for malignant neoplasm of prostate: Secondary | ICD-10-CM

## 2018-04-18 MED ORDER — TIOTROPIUM BROMIDE MONOHYDRATE 18 MCG IN CAPS: 18.0000 ug | ORAL_CAPSULE | Freq: Every day | RESPIRATORY_TRACT | 12 refills | Status: DC

## 2018-04-18 MED ORDER — IPRATROPIUM-ALBUTEROL 0.5-2.5 (3) MG/3ML IN SOLN: 3.0000 mL | RESPIRATORY_TRACT | 1 refills | Status: DC

## 2018-04-18 MED ORDER — ALBUTEROL SULFATE HFA 108 (90 BASE) MCG/ACT IN AERS: INHALATION_SPRAY | RESPIRATORY_TRACT | 0 refills | Status: DC

## 2018-04-18 NOTE — Patient Instructions (Signed)
  Cory Dennis , Thank you for taking time to come for your Medicare Wellness Visit. I appreciate your ongoing commitment to your health goals. Please review the following plan we discussed and let me know if I can assist you in the future.   List below I will update -   Most refused - but flu done  This is a list of the screening recommended for you and due dates:  Health Maintenance  Topic Date Due  . Colon Cancer Screening  04/29/1995  . Pneumonia vaccines (2 of 2 - PCV13) 05/06/2016  . Flu Shot  12/27/2017  . Tetanus Vaccine  02/27/2020  .  Hepatitis C: One time screening is recommended by Center for Disease Control  (CDC) for  adults born from [birth year] through 1965.   Completed

## 2018-04-18 NOTE — Progress Notes (Signed)
Subjective:   Cory Dennis is a 72 y.o. male who presents for Medicare Annual/Subsequent preventive examination.  Review of Systems:   Review of Systems  Constitutional: Negative.  Negative for activity change, appetite change, chills, diaphoresis, fatigue, fever and unexpected weight change.  HENT: Negative.   Eyes: Negative.   Respiratory: Positive for cough and shortness of breath (baseline ).   Cardiovascular: Negative.  Negative for chest pain.  Gastrointestinal: Negative.   Endocrine: Negative.   Genitourinary: Negative.   Musculoskeletal: Negative.   Skin: Negative.   Allergic/Immunologic: Negative.   Neurological: Negative.   Hematological: Negative.   Psychiatric/Behavioral: Negative.   All other systems reviewed and are negative.    Cardiac Risk Factors include: advanced age (>29men, >70 women);hypertension;smoking/ tobacco exposure;male gender;sedentary lifestyle;dyslipidemia     Objective:    Vitals: BP 136/84   Pulse 89   Temp 97.7 F (36.5 C) (Oral)   Wt 163 lb 6 oz (74.1 kg)   SpO2 92%   BMI 28.04 kg/m   Body mass index is 28.04 kg/m.  Advanced Directives 04/18/2018 05/05/2015 03/02/2015 08/19/2014  Does Patient Have a Medical Advance Directive? Yes No No Yes  Type of Advance Directive Living will;Healthcare Power of Attorney - - Living will  Copy of Healthcare Power of Attorney in Chart? No - copy requested - - No - copy requested    Tobacco Social History   Tobacco Use  Smoking Status Current Every Day Smoker  . Packs/day: 1.00  . Years: 42.00  . Pack years: 42.00  . Types: Cigarettes  Smokeless Tobacco Never Used  Tobacco Comment   working on it     Ready to quit: No Counseling given: Yes Comment: working on it   Clinical Intake:  Pre-visit preparation completed: No  Pain : No/denies pain     BMI - recorded: 28 Nutritional Status: BMI 25 -29 Overweight Nutritional Risks: None Diabetes: No  How often do you need to have  someone help you when you read instructions, pamphlets, or other written materials from your doctor or pharmacy?: 1 - Never  Interpreter Needed?: No  Information entered by :: LT-PA  Past Medical History:  Diagnosis Date  . Alcohol abuse   . Anemia   . Anxiety   . Arthritis   . CAD (coronary artery disease)    a. s/p CABG in 2011 with LIMA-LAD, RIMA-distal RCA, and sequential SVG-OM1/OM 2 complicated by nonunion of sternum)  . CHF (congestive heart failure) (HCC)    a. EF 25% by echo in 07/2014. Declined ICD placement.   . Chronic systolic CHF (congestive heart failure) (HCC) 11/06/2014  . COPD (chronic obstructive pulmonary disease) (HCC)   . Depression   . Emphysema of lung (HCC)   . Fatigue   . Hypertension   . Ischemic cardiomyopathy   . Musculoskeletal chest pain   . Myocardial infarction (HCC)   . SOB (shortness of breath)   . Syncope   . Tobacco abuse    Past Surgical History:  Procedure Laterality Date  . CORONARY ARTERY BYPASS GRAFT  2011   4v CABG [facility] in [place] in 06/2009 with LIMA to LAD, RIMA to distal RCA, sequential SVG to OM1/OM 2  . CRANIOTOMY Left 05/05/2015   Procedure: Left CRANIOTOMY HEMATOMA EVACUATION SUBDURAL;  Surgeon: Tia Alert, MD;  Location: [facility];  Service: Neurosurgery;  Laterality: Left;  . Left ventricular aneurysm repair     Family History  Family history unknown: Yes  Social History   Socioeconomic History  . Marital status: Single    Spouse name: Not on file  . Number of children: Not on file  . Years of education: Not on file  . Highest education level: Not on file  Occupational History  . Occupation: Research scientist (physical sciences)  . Financial resource strain: Not on file  . Food insecurity:    Worry: Not on file    Inability: Not on file  . Transportation needs:    Medical: Not on file    Non-medical: Not on file  Tobacco Use  . Smoking status: Current Every Day Smoker    Packs/day: 1.00    Years: 42.00     Pack years: 42.00    Types: Cigarettes  . Smokeless tobacco: Never Used  . Tobacco comment: working on it  Substance and Sexual Activity  . Alcohol use: Yes    Alcohol/week: 12.0 - 20.0 standard drinks    Types: 12 - 20 Cans of beer per week  . Drug use: No  . Sexual activity: Not Currently  Lifestyle  . Physical activity:    Days per week: 0 days    Minutes per session: Not on file  . Stress: Not on file  Relationships  . Social connections:    Talks on phone: Not on file    Gets together: Not on file    Attends religious service: Not on file    Active member of club or organization: Not on file    Attends meetings of clubs or organizations: Not on file    Relationship status: Not on file  Other Topics Concern  . Not on file  Social History Narrative  . Not on file    Outpatient Encounter Medications as of 04/18/2018  Medication Sig  . albuterol (PROAIR HFA) 108 (90 Base) MCG/ACT inhaler INHALE 2 PUFFS INTO THE LUNGS EVERY 6 (SIX) HOURS AS NEEDED FOR SHORTNESS OF BREATH.  Marland Kitchen aspirin 81 MG tablet Take 81 mg by mouth daily.  . folic acid (FOLVITE) 1 MG tablet TAKE 1 TABLET BY MOUTH EVERY DAY. APPT NEEDED FOR FURTHER REFILLS  . furosemide (LASIX) 40 MG tablet Take 1 tablet (40 mg total) by mouth 2 (two) times daily.  Marland Kitchen ipratropium-albuterol (DUONEB) 0.5-2.5 (3) MG/3ML SOLN Take 3 mLs by nebulization every 4 (four) hours.  Marland Kitchen lisinopril (PRINIVIL,ZESTRIL) 10 MG tablet TAKE 1 TABLET BY MOUTH EVERY DAY  . metoprolol tartrate (LOPRESSOR) 25 MG tablet Take 1 tablet (25 mg total) by mouth 2 (two) times daily.  Marland Kitchen tiotropium (SPIRIVA HANDIHALER) 18 MCG inhalation capsule Place 1 capsule (18 mcg total) into inhaler and inhale daily.  . traMADol (ULTRAM) 50 MG tablet Take 1-2 tablets every 8 hours as needed for pain.  . [DISCONTINUED] ipratropium-albuterol (DUONEB) 0.5-2.5 (3) MG/3ML SOLN Take 3 mLs by nebulization every 4 (four) hours.  . [DISCONTINUED] PROAIR HFA 108 (90 Base) MCG/ACT  inhaler INHALE 2 PUFFS INTO THE LUNGS EVERY 6 (SIX) HOURS AS NEEDED FOR SHORTNESS OF BREATH.  . [DISCONTINUED] tiotropium (SPIRIVA HANDIHALER) 18 MCG inhalation capsule Place 1 capsule (18 mcg total) into inhaler and inhale daily.   No facility-administered encounter medications on file as of 04/18/2018.     Activities of Daily Living In your present state of health, do you have any difficulty performing the following activities: 04/18/2018  Hearing? N  Vision? N  Difficulty concentrating or making decisions? N  Walking or climbing stairs? Y  Dressing or bathing? Y  Doing  errands, shopping? Y  Preparing Food and eating ? Y  Using the Toilet? Y  In the past six months, have you accidently leaked urine? N  Do you have problems with loss of bowel control? N  Managing your Medications? N  Managing your Finances? N  Housekeeping or managing your Housekeeping? N  Some recent data might be hidden    Patient Care Team: Deon Pilling as PCP - General (Physician Assistant)   Peter Swaziland - cardiology   Assessment:   This is a routine wellness examination for [place].  He continues to smoke, does not have any interest in quitting, he has cut back on his alcohol intake, he will get flu today, and does not want to pursue any other health maintenance screening test injections or preventative medicine.  Exercise Activities and Dietary recommendations Current Exercise Habits: The patient does not participate in regular exercise at present, Exercise limited by: respiratory conditions(s);cardiac condition(s);neurologic condition(s)  Goals   pt does not want to establish any goals, wants to live his life     Fall Risk Fall Risk  04/18/2018 02/28/2017 07/13/2016 03/22/2015  Falls in the past year? 1 Yes Yes No  Number falls in past yr: 0 2 or more 2 or more -  Injury with Fall? 0 No Yes -  Comment - - hit back of head -  Risk for fall due to : Other (Comment) - - -  Follow up Falls  evaluation completed;Education provided;Falls prevention discussed - - -   Is the patient's home free of loose throw rugs in walkways, pet beds, electrical cords, etc?   yes      Grab bars in the bathroom? yes      Handrails on the stairs?   yes      Adequate lighting?   yes    Depression Screen PHQ 2/9 Scores 04/18/2018 02/28/2017 03/22/2015  PHQ - 2 Score 0 3 0  PHQ- 9 Score - 7 -       Cognitive Function     6CIT Screen 04/18/2018  What Year? 0 points  What month? 0 points  What time? 0 points  Count back from 20 0 points  Months in reverse 0 points  Repeat phrase 0 points  Total Score 0    Immunization History  Administered Date(s) Administered  . Influenza, High Dose Seasonal PF 02/28/2017, 04/18/2018  . Influenza,inj,Quad PF,6+ Mos 03/22/2015  . Influenza-Unspecified 01/27/2013, 02/26/2014  . Pneumococcal Polysaccharide-23 05/10/2004, 05/07/2015  . Tdap 02/26/2010    Qualifies for Shingles Vaccine? refuses  Screening Tests Health Maintenance  Topic Date Due  . PNA vac Low Risk Adult (2 of 2 - PCV13) 04/19/2019 (Originally 05/06/2016)  . COLONOSCOPY  04/07/2024 (Originally 04/29/1995)  . TETANUS/TDAP  02/27/2020  . INFLUENZA VACCINE  Completed  . Hepatitis C Screening  Completed  Needs flu -today Fuses colonoscopy Refuses Pneumovax, he did have Pneumovax 23 thousand 5 and then in 2016 and was told he did not need anymore so he refuses PCV 13     Cancer Screenings: Lung: Low Dose CT Chest recommended if Age 28-80 years, 30 pack-year currently smoking OR have quit w/in 15years. Patient does qualify. - but does not want to do any lung cancer screening Colorectal: refuses      Plan:    Pt comes about once a year from what I can see - here for MWV, did not fast - will return for labs when fasting.    ICD-10-CM  1. Encounter for Medicare annual wellness exam Z00.00   2. Pulmonary emphysema, unspecified emphysema type (HCC) J43.9 ipratropium-albuterol  (DUONEB) 0.5-2.5 (3) MG/3ML SOLN    albuterol (PROAIR HFA) 108 (90 Base) MCG/ACT inhaler    tiotropium (SPIRIVA HANDIHALER) 18 MCG inhalation capsule  3. Chronic systolic CHF (congestive heart failure) (HCC) I50.22 CBC with Differential/Platelet    COMPLETE METABOLIC PANEL WITH GFR    Lipid panel    PSA   cardiology manages, on lasix, check chemistry  4. Tobacco abuse Z72.0    continues to smoke, no desire to quit  5. Coronary artery disease involving native coronary artery of native heart without angina pectoris I25.10 CBC with Differential/Platelet    COMPLETE METABOLIC PANEL WITH GFR    Lipid panel    Flu vaccine HIGH DOSE PF (Fluzone High dose)   cardiology manages s/p CABG  6. Anemia of chronic disease D63.8 CBC with Differential/Platelet   recheck cbc  7. Alcohol abuse F10.10 COMPLETE METABOLIC PANEL WITH GFR    Lipid panel   improving, not drinking every day  8. Prostate cancer screening Z12.5 PSA  9. Need for influenza vaccination Z23 Flu vaccine HIGH DOSE PF (Fluzone High dose)  10. Risk for falls Z91.81      I have personally reviewed and noted the following in the patient's chart:   . Medical and social history . Use of alcohol, tobacco or illicit drugs  . Current medications and supplements . Functional ability and status . Nutritional status . Physical activity . Advanced directives . List of other physicians . Hospitalizations, surgeries, and ER visits in previous 12 months . Vitals . Screenings to include cognitive, depression, and falls . Referrals and appointments  In addition, I have reviewed and discussed with patient certain preventive protocols, quality metrics, and best practice recommendations. A written personalized care plan for preventive services as well as general preventive health recommendations were provided to patient.     Danelle Berry, PA-C  04/18/2018

## 2018-04-23 ENCOUNTER — Other Ambulatory Visit: Payer: Self-pay | Admitting: Cardiology

## 2018-05-01 ENCOUNTER — Ambulatory Visit (INDEPENDENT_AMBULATORY_CARE_PROVIDER_SITE_OTHER): Payer: Medicare Other | Admitting: Cardiology

## 2018-05-01 ENCOUNTER — Encounter: Payer: Self-pay | Admitting: Cardiology

## 2018-05-01 ENCOUNTER — Other Ambulatory Visit: Payer: Self-pay

## 2018-05-01 VITALS — BP 128/72 | HR 68 | Ht 64.0 in | Wt 160.6 lb

## 2018-05-01 DIAGNOSIS — I10 Essential (primary) hypertension: Secondary | ICD-10-CM | POA: Diagnosis not present

## 2018-05-01 DIAGNOSIS — I255 Ischemic cardiomyopathy: Secondary | ICD-10-CM | POA: Diagnosis not present

## 2018-05-01 DIAGNOSIS — I5042 Chronic combined systolic (congestive) and diastolic (congestive) heart failure: Secondary | ICD-10-CM

## 2018-05-01 DIAGNOSIS — Z720 Tobacco use: Secondary | ICD-10-CM | POA: Diagnosis not present

## 2018-05-01 DIAGNOSIS — I2510 Atherosclerotic heart disease of native coronary artery without angina pectoris: Secondary | ICD-10-CM | POA: Diagnosis not present

## 2018-05-01 DIAGNOSIS — I25810 Atherosclerosis of coronary artery bypass graft(s) without angina pectoris: Secondary | ICD-10-CM | POA: Diagnosis not present

## 2018-05-01 DIAGNOSIS — R351 Nocturia: Secondary | ICD-10-CM | POA: Diagnosis not present

## 2018-05-01 NOTE — Progress Notes (Signed)
Cardiology Office Note    Date:  05/01/2018   ID:  Cory Dennis, DOB 12-31-1945, MRN [id number]  PCP:  Dorena Bodo, PA-C  Cardiologist: Dr. [place]   Chief Complaint  Patient presents with  . Follow-up  . Coronary Artery Disease    History of Present Illness:    Cory Dennis is a 72 y.o. male with past medical history of CAD (s/p CABG in 2011 with LIMA-LAD, RIMA-distal RCA, and sequential SVG-OM1/OM 2 complicated by nonunion of sternum), ischemic cardiomyopathy (declined ICD placement), chronic combined systolic and diastolic CHF (EF 91% by echo in 07/2014), HTN, HLD, COPD, and tobacco use who presents today for follow up CAD and CHF.    He was admitted in Dec 2016  for a subdural hematoma requiring evacuation. Last seen by Randall An PA-C one year ago.   He is seen today with his daughter. He states he is doing well. No chest pain other than discomfort in his sternum when he lies on his left side. It still clicks from nonunion. He is still smoking 1 ppd. States he rarely drinks anymore. No dyspnea. He was seen by primary care 2 weeks ago and fasting labs ordered but he has not returned to have them drawn. He has not eaten today.   Past Medical History:  Diagnosis Date  . Alcohol abuse   . Anemia   . Anxiety   . Arthritis   . CAD (coronary artery disease)    a. s/p CABG in 2011 with LIMA-LAD, RIMA-distal RCA, and sequential SVG-OM1/OM 2 complicated by nonunion of sternum)  . CHF (congestive heart failure) (HCC)    a. EF 25% by echo in 07/2014. Declined ICD placement.   . Chronic systolic CHF (congestive heart failure) (HCC) 11/06/2014  . COPD (chronic obstructive pulmonary disease) (HCC)   . Depression   . Emphysema of lung (HCC)   . Fatigue   . Hypertension   . Ischemic cardiomyopathy   . Musculoskeletal chest pain   . Myocardial infarction (HCC)   . SOB (shortness of breath)   . Syncope   . Tobacco abuse     Past Surgical History:  Procedure  Laterality Date  . CORONARY ARTERY BYPASS GRAFT  2011   4v CABG [facility] in [place] in 06/2009 with LIMA to LAD, RIMA to distal RCA, sequential SVG to OM1/OM 2  . CRANIOTOMY Left 05/05/2015   Procedure: Left CRANIOTOMY HEMATOMA EVACUATION SUBDURAL;  Surgeon: Tia Alert, MD;  Location: [facility];  Service: Neurosurgery;  Laterality: Left;  . Left ventricular aneurysm repair      Current Medications: Outpatient Medications Prior to Visit  Medication Sig Dispense Refill  . albuterol (PROAIR HFA) 108 (90 Base) MCG/ACT inhaler INHALE 2 PUFFS INTO THE LUNGS EVERY 6 (SIX) HOURS AS NEEDED FOR SHORTNESS OF BREATH. 8.5 Inhaler 0  . aspirin 81 MG tablet Take 81 mg by mouth daily.    . folic acid (FOLVITE) 1 MG tablet TAKE 1 TABLET BY MOUTH EVERY DAY. APPT NEEDED FOR FURTHER REFILLS 90 tablet 3  . furosemide (LASIX) 40 MG tablet TAKE 1 TABLET BY MOUTH TWICE A DAY 30 tablet 0  . ipratropium-albuterol (DUONEB) 0.5-2.5 (3) MG/3ML SOLN Take 3 mLs by nebulization every 4 (four) hours. 360 mL 1  . lisinopril (PRINIVIL,ZESTRIL) 10 MG tablet TAKE 1 TABLET BY MOUTH EVERY DAY 90 tablet 1  . metoprolol tartrate (LOPRESSOR) 25 MG tablet TAKE 1 TABLET BY MOUTH TWICE A DAY 30 tablet  0  . tiotropium (SPIRIVA HANDIHALER) 18 MCG inhalation capsule Place 1 capsule (18 mcg total) into inhaler and inhale daily. 30 capsule 12  . traMADol (ULTRAM) 50 MG tablet Take 1-2 tablets every 8 hours as needed for pain. 60 tablet 0   No facility-administered medications prior to visit.      Allergies:   Penicillins   Social History   Socioeconomic History  . Marital status: Single    Spouse name: Not on file  . Number of children: Not on file  . Years of education: Not on file  . Highest education level: Not on file  Occupational History  . Occupation: Research scientist (physical sciences)  . Financial resource strain: Not on file  . Food insecurity:    Worry: Not on file    Inability: Not on file  . Transportation needs:     Medical: Not on file    Non-medical: Not on file  Tobacco Use  . Smoking status: Current Every Day Smoker    Packs/day: 1.00    Years: 42.00    Pack years: 42.00    Types: Cigarettes  . Smokeless tobacco: Never Used  . Tobacco comment: working on it  Substance and Sexual Activity  . Alcohol use: Yes    Alcohol/week: 12.0 - 20.0 standard drinks    Types: 12 - 20 Cans of beer per week  . Drug use: No  . Sexual activity: Not Currently  Lifestyle  . Physical activity:    Days per week: 0 days    Minutes per session: Not on file  . Stress: Not on file  Relationships  . Social connections:    Talks on phone: Not on file    Gets together: Not on file    Attends religious service: Not on file    Active member of club or organization: Not on file    Attends meetings of clubs or organizations: Not on file    Relationship status: Not on file  Other Topics Concern  . Not on file  Social History Narrative  . Not on file     Family History:  The patient's family history is unknown by patient.   Review of Systems:   As noted in the history of present illness.     All other systems reviewed and are otherwise negative except as noted above.   Physical Exam:    VS:  BP 128/72   Pulse 68   Ht 5\' 4"  (1.626 m)   Wt 160 lb 9.6 oz (72.8 kg)   BMI 27.57 kg/m    GENERAL:  Well appearing WM in NAD HEENT:  PERRL, EOMI, sclera are clear. Oropharynx is clear. NECK:  No jugular venous distention, carotid upstroke brisk and symmetric, no bruits, no thyromegaly or adenopathy LUNGS:  Clear to auscultation bilaterally CHEST:  Sternal instability with click HEART:  RRR,  PMI not displaced or sustained,S1 and S2 within normal limits, no S3, no S4: no clicks, no rubs, no murmurs ABD:  Soft, nontender. BS +, no masses or bruits. No hepatomegaly, no splenomegaly EXT:  2 + pulses throughout, no edema, no cyanosis no clubbing SKIN:  Warm and dry.  No rashes NEURO:  Alert and oriented x 3. Cranial  nerves II through XII intact. PSYCH:  Cognitively intact    Wt Readings from Last 3 Encounters:  05/01/18 160 lb 9.6 oz (72.8 kg)  04/18/18 163 lb 6 oz (74.1 kg)  03/05/17 160 lb (72.6 kg)  Studies/Labs Reviewed:   EKG:  EKG is  ordered today.  The ekg ordered today demonstrates NSR, HR 68, frequent PVCs, with infero- lateral TWI (similar to prior tracings). I have personally reviewed and interpreted this study.    Recent Labs: No results found for requested labs within last 8760 hours.   Lipid Panel    Component Value Date/Time   CHOL 158 02/19/2017 0937   TRIG 49 02/19/2017 0937   HDL 69 02/19/2017 0937   CHOLHDL 2.3 02/19/2017 0937   CHOLHDL 2.4 06/24/2014 1400   VLDL 10 06/24/2014 1400   LDLCALC 79 02/19/2017 0937    Additional studies/ records that were reviewed today include:   Echocardiogram: 07/2014 Study Conclusions  - Left ventricle: The cavity size was mildly dilated. Wall thickness was normal. The estimated ejection fraction was 25%. The apical septal wall and true apex were akinetic. The inferolateral wall was akinetic. The basal and apical inferior wall segments were akinetic. The mid to apical anterolateral wall was akinetic. Definity contrast was used, no LV thrombus visualized. Features are consistent with a pseudonormal left ventricular filling pattern, with concomitant abnormal relaxation and increased filling pressure (grade 2 diastolic dysfunction). E/medial e&' > 15 suggests LV end diastolic pressure at least 20 mmHg. - Aortic valve: There was no stenosis. - Mitral valve: Mildly calcified annulus. Mildly calcified leaflets . There was mild regurgitation. - Left atrium: The atrium was moderately dilated. - Right ventricle: Poorly visualized. The cavity size was normal. Systolic function was mildly reduced. - Tricuspid valve: Peak RV-RA gradient (S): 41 mm Hg. - Pulmonary arteries: PA peak pressure: 44 mm Hg (S). -  Inferior vena cava: The vessel was normal in size. The respirophasic diameter changes were in the normal range (>= 50%), consistent with normal central venous pressure.  Impressions:  - Mildly dilated LV with EF 25%. Wall motion abnormalities as noted above. No LV thrombus noted with Definity. Moderate diastolic dysfunction with evidence for elevated LV filling pressure. Normal RV size with mildly decreased systolic function. The IVC was not dilated. Mild pulmonary hypertension.  Assessment:    1. Coronary artery disease involving coronary bypass graft of native heart without angina pectoris   2. Chronic combined systolic and diastolic heart failure (HCC)   3. Ischemic cardiomyopathy   4. Essential hypertension   5. Tobacco abuse      Plan:   In order of problems listed above:  1. CAD - s/p CABG in 2011 with LIMA-LAD, RIMA-distal RCA, and sequential SVG-OM1/OM 2 complicated by nonunion of sternum - he has no anginal symptoms - continue ASA and BB. - unclear why he is not on statin. Will go ahead and draw labs today. Would add statin if LDL > 70.   2. Chronic Combined Systolic and Diastolic CHF/ Ischemic Cardiomyopathy - EF 25% by echo in 2016. He has declined ICD placement in the past. - no history of VT/VF - he appears euvolemic on exam.  Currently on Lasix 40mg  BID, Lisinopril 10mg  daily, and Lopressor 25mg  BID. Refused Entresto in past.  3. HLD -  Will recheck FLP and LFT's today. If not at goal, recommend initiation of Atorvastatin 20mg  daily.   4. HTN - BP is well-controlled  - continue Lisinopril 10mg  daily and Lopressor 25mg  BID.   5. Tobacco Use - continues to smokes 1+ ppd. Cessation advised but he is not interested in quitting.   6. Alcohol Abuse - he has reduced his alcohol intake significantly.  Follow up in one  year.       Signed, Peter Sw[place], MD  05/01/2018 4:30 PM

## 2018-05-02 ENCOUNTER — Other Ambulatory Visit: Payer: Self-pay

## 2018-05-02 ENCOUNTER — Telehealth: Payer: Self-pay | Admitting: Cardiology

## 2018-05-02 DIAGNOSIS — I5042 Chronic combined systolic (congestive) and diastolic (congestive) heart failure: Secondary | ICD-10-CM

## 2018-05-02 DIAGNOSIS — I2510 Atherosclerotic heart disease of native coronary artery without angina pectoris: Secondary | ICD-10-CM

## 2018-05-02 LAB — LIPID PANEL
Chol/HDL Ratio: 3.8 ratio (ref 0.0–5.0)
Cholesterol, Total: 186 mg/dL (ref 100–199)
HDL: 49 mg/dL (ref >39)
LDL Calculated: 113 mg/dL — ABNORMAL HIGH (ref 0–99)
Triglycerides: 118 mg/dL (ref 0–149)
VLDL Cholesterol Cal: 24 mg/dL (ref 5–40)

## 2018-05-02 LAB — CBC WITH DIFFERENTIAL/PLATELET
Basophils Absolute: 0.1 10*3/uL (ref 0.0–0.2)
Basos: 1 %
EOS (ABSOLUTE): 0.1 10*3/uL (ref 0.0–0.4)
Eos: 1 %
Hematocrit: 41.9 % (ref 37.5–51.0)
Hemoglobin: 14.7 g/dL (ref 13.0–17.7)
Immature Grans (Abs): 0.1 10*3/uL (ref 0.0–0.1)
Immature Granulocytes: 1 %
Lymphocytes Absolute: 2.7 10*3/uL (ref 0.7–3.1)
Lymphs: 26 %
MCH: 31.3 pg (ref 26.6–33.0)
MCHC: 35.1 g/dL (ref 31.5–35.7)
MCV: 89 fL (ref 79–97)
Monocytes Absolute: 1 10*3/uL — ABNORMAL HIGH (ref 0.1–0.9)
Monocytes: 10 %
Neutrophils Absolute: 6.4 10*3/uL (ref 1.4–7.0)
Neutrophils: 61 %
Platelets: 237 10*3/uL (ref 150–450)
RBC: 4.69 x10E6/uL (ref 4.14–5.80)
RDW: 12.7 % (ref 12.3–15.4)
WBC: 10.2 10*3/uL (ref 3.4–10.8)

## 2018-05-02 LAB — COMPREHENSIVE METABOLIC PANEL
ALT: 27 IU/L (ref 0–44)
AST: 24 IU/L (ref 0–40)
Albumin/Globulin Ratio: 1.5 (ref 1.2–2.2)
Albumin: 4.4 g/dL (ref 3.5–4.8)
Alkaline Phosphatase: 110 IU/L (ref 39–117)
BILIRUBIN TOTAL: 0.5 mg/dL (ref 0.0–1.2)
BUN/Creatinine Ratio: 31 — ABNORMAL HIGH (ref 10–24)
BUN: 48 mg/dL — ABNORMAL HIGH (ref 8–27)
CO2: 21 mmol/L (ref 20–29)
Calcium: 9.4 mg/dL (ref 8.6–10.2)
Chloride: 91 mmol/L — ABNORMAL LOW (ref 96–106)
Creatinine, Ser: 1.57 mg/dL — ABNORMAL HIGH (ref 0.76–1.27)
GFR calc Af Amer: 50 mL/min/{1.73_m2} — ABNORMAL LOW (ref >59)
GFR calc non Af Amer: 43 mL/min/{1.73_m2} — ABNORMAL LOW (ref >59)
Globulin, Total: 2.9 g/dL (ref 1.5–4.5)
Glucose: 97 mg/dL (ref 65–99)
POTASSIUM: 5.1 mmol/L (ref 3.5–5.2)
Sodium: 131 mmol/L — ABNORMAL LOW (ref 134–144)
TOTAL PROTEIN: 7.3 g/dL (ref 6.0–8.5)

## 2018-05-02 LAB — PSA: Prostate Specific Ag, Serum: 2.2 ng/mL (ref 0.0–4.0)

## 2018-05-02 MED ORDER — ROSUVASTATIN CALCIUM 20 MG PO TABS: 20.0000 mg | ORAL_TABLET | Freq: Every day | ORAL | 6 refills | Status: DC

## 2018-05-02 NOTE — Progress Notes (Signed)
[facility]

## 2018-05-02 NOTE — Telephone Encounter (Signed)
New Message ° ° °Pt returning call for nurse °

## 2018-05-02 NOTE — Telephone Encounter (Signed)
Spoke to patient lab results given. 

## 2018-05-06 ENCOUNTER — Telehealth: Payer: Self-pay | Admitting: Cardiology

## 2018-05-06 NOTE — Telephone Encounter (Signed)
Pt c/o medication issue:  1. Name of Medication: Rosuvastatin  2. How are you currently taking this medication (dosage and times per day)? 1 time a day  3. Are you having a reaction (difficulty breathing--STAT)? no  4. What is your medication issue? Went out for a minute, confused-thinks this new medicine is causing this

## 2018-05-06 NOTE — Telephone Encounter (Signed)
Returned call to patient spoke to [place] she stated patient started Crestor 20 mg last Fri 12/6.Stated about 1 hour after taking he became very swimmy headed and hallucinated.Stated she read side effects and he is afraid to take.Advised to hold. I will send message to Dr.Jordan for advice.

## 2018-05-06 NOTE — Telephone Encounter (Signed)
I am not aware of Crestor causing these symptoms. Let's try lipitor instead. Can start at lower dose of 10 mg daily.  Maxime Beckner [place] MD, [facility]

## 2018-05-08 NOTE — Telephone Encounter (Signed)
Spoke to patient he stated he is able to take Crestor now.Stated he starting taking in afternoon and has not had anymore problems.Advised to continue.

## 2018-05-10 ENCOUNTER — Ambulatory Visit: Payer: Medicare Other | Admitting: Cardiology

## 2018-05-13 ENCOUNTER — Other Ambulatory Visit: Payer: Self-pay | Admitting: Cardiology

## 2018-07-08 ENCOUNTER — Telehealth: Payer: Self-pay | Admitting: Cardiology

## 2018-07-08 NOTE — Telephone Encounter (Signed)
New Message:      Please call, question about his Cholesterol medicine.

## 2018-07-09 MED ORDER — ROSUVASTATIN CALCIUM 5 MG PO TABS: ORAL_TABLET | ORAL | 6 refills | Status: DC

## 2018-07-09 NOTE — Telephone Encounter (Signed)
Returned call to patient no answer.LMTC. 

## 2018-07-09 NOTE — Telephone Encounter (Signed)
Follow up  Cory Dennis is returning your call.

## 2018-07-09 NOTE — Telephone Encounter (Signed)
Returned call to patient's daughter Archie Patten.She stated patient stopped taking Crestor 1 week ago.Stated he was having weakness,no appetite,nausea and vomiting.Stated he feels better since he stopped taking.Advised I will speak to Dr.Jordan and call you back.

## 2018-07-09 NOTE — Telephone Encounter (Signed)
Returned call to [place] Dr.Jordan advised decrease Crestor to 5 mg every other day.Advised to call back if he is unable to take.

## 2018-07-09 NOTE — Telephone Encounter (Signed)
Unable to leave message, will try again later.

## 2018-08-01 DIAGNOSIS — I5042 Chronic combined systolic (congestive) and diastolic (congestive) heart failure: Secondary | ICD-10-CM | POA: Diagnosis not present

## 2018-08-01 DIAGNOSIS — I2510 Atherosclerotic heart disease of native coronary artery without angina pectoris: Secondary | ICD-10-CM | POA: Diagnosis not present

## 2018-08-01 LAB — HEPATIC FUNCTION PANEL
ALT: 27 IU/L (ref 0–44)
AST: 29 IU/L (ref 0–40)
Albumin: 3.6 g/dL — ABNORMAL LOW (ref 3.7–4.7)
Alkaline Phosphatase: 140 IU/L — ABNORMAL HIGH (ref 39–117)
Bilirubin Total: 0.4 mg/dL (ref 0.0–1.2)
Bilirubin, Direct: 0.14 mg/dL (ref 0.00–0.40)
TOTAL PROTEIN: 6.8 g/dL (ref 6.0–8.5)

## 2018-08-01 LAB — LIPID PANEL
CHOL/HDL RATIO: 3.6 ratio (ref 0.0–5.0)
Cholesterol, Total: 138 mg/dL (ref 100–199)
HDL: 38 mg/dL — ABNORMAL LOW (ref >39)
LDL Calculated: 74 mg/dL (ref 0–99)
Triglycerides: 131 mg/dL (ref 0–149)
VLDL CHOLESTEROL CAL: 26 mg/dL (ref 5–40)

## 2018-08-13 ENCOUNTER — Other Ambulatory Visit: Payer: Self-pay

## 2018-08-13 MED ORDER — LISINOPRIL 10 MG PO TABS: 10.0000 mg | ORAL_TABLET | Freq: Every day | ORAL | 2 refills | Status: DC

## 2018-08-19 ENCOUNTER — Ambulatory Visit: Payer: Medicare Other | Admitting: Podiatry

## 2018-08-19 ENCOUNTER — Ambulatory Visit: Payer: Medicare Other | Admitting: Podiatrist

## 2018-08-20 ENCOUNTER — Encounter: Payer: Self-pay | Admitting: Podiatry

## 2018-08-20 ENCOUNTER — Ambulatory Visit (INDEPENDENT_AMBULATORY_CARE_PROVIDER_SITE_OTHER): Payer: Medicare Other | Admitting: Podiatry

## 2018-08-20 ENCOUNTER — Other Ambulatory Visit: Payer: Self-pay

## 2018-08-20 VITALS — BP 98/49

## 2018-08-20 DIAGNOSIS — I739 Peripheral vascular disease, unspecified: Secondary | ICD-10-CM | POA: Diagnosis not present

## 2018-08-20 DIAGNOSIS — L539 Erythematous condition, unspecified: Secondary | ICD-10-CM | POA: Diagnosis not present

## 2018-08-20 DIAGNOSIS — B351 Tinea unguium: Secondary | ICD-10-CM

## 2018-08-20 DIAGNOSIS — M79675 Pain in left toe(s): Secondary | ICD-10-CM

## 2018-08-20 DIAGNOSIS — M79674 Pain in right toe(s): Secondary | ICD-10-CM

## 2018-08-21 ENCOUNTER — Other Ambulatory Visit: Payer: Self-pay | Admitting: *Deleted

## 2018-08-21 MED ORDER — FUROSEMIDE 40 MG PO TABS: 40.0000 mg | ORAL_TABLET | Freq: Two times a day (BID) | ORAL | 5 refills | Status: DC

## 2018-08-21 NOTE — Telephone Encounter (Signed)
This is Dr. Elvis Coil patient.

## 2018-08-26 ENCOUNTER — Telehealth: Payer: Self-pay | Admitting: *Deleted

## 2018-08-26 DIAGNOSIS — M79675 Pain in left toe(s): Secondary | ICD-10-CM

## 2018-08-26 DIAGNOSIS — M79674 Pain in right toe(s): Secondary | ICD-10-CM

## 2018-08-26 DIAGNOSIS — L539 Erythematous condition, unspecified: Secondary | ICD-10-CM

## 2018-08-26 DIAGNOSIS — I739 Peripheral vascular disease, unspecified: Secondary | ICD-10-CM

## 2018-08-26 NOTE — Progress Notes (Signed)
Subjective:   Patient ID: Cory Dennis, male   DOB: 73 y.o.   MRN: [id number]   HPI 73 year old male presents to the office today for concerns of pain to all of his toes particularly his toenails.  This is been ongoing for last couple of years.  He also states that he has had some numbness to the toes.  Denies any open sores.  He does not walk much. He does relate occasional calf pain.  No other concerns.   Review of Systems  All other systems reviewed and are negative.  Past Medical History:  Diagnosis Date  . Alcohol abuse   . Anemia   . Anxiety   . Arthritis   . CAD (coronary artery disease)    a. s/p CABG in 2011 with LIMA-LAD, RIMA-distal RCA, and sequential SVG-OM1/OM 2 complicated by nonunion of sternum)  . CHF (congestive heart failure) (HCC)    a. EF 25% by echo in 07/2014. Declined ICD placement.   . Chronic systolic CHF (congestive heart failure) (HCC) 11/06/2014  . COPD (chronic obstructive pulmonary disease) (HCC)   . Depression   . Emphysema of lung (HCC)   . Fatigue   . Hypertension   . Ischemic cardiomyopathy   . Musculoskeletal chest pain   . Myocardial infarction (HCC)   . SOB (shortness of breath)   . Syncope   . Tobacco abuse     Past Surgical History:  Procedure Laterality Date  . CORONARY ARTERY BYPASS GRAFT  2011   4v CABG [facility] in [place] in 06/2009 with LIMA to LAD, RIMA to distal RCA, sequential SVG to OM1/OM 2  . CRANIOTOMY Left 05/05/2015   Procedure: Left CRANIOTOMY HEMATOMA EVACUATION SUBDURAL;  Surgeon: Tia Alert, MD;  Location: [facility];  Service: Neurosurgery;  Laterality: Left;  . Left ventricular aneurysm repair       Current Outpatient Medications:  .  albuterol (PROAIR HFA) 108 (90 Base) MCG/ACT inhaler, INHALE 2 PUFFS INTO THE LUNGS EVERY 6 (SIX) HOURS AS NEEDED FOR SHORTNESS OF BREATH., Disp: 8.5 Inhaler, Rfl: 0 .  aspirin 81 MG tablet, Take 81 mg by mouth daily., Disp: , Rfl:  .  folic acid (FOLVITE) 1 MG tablet,  TAKE 1 TABLET BY MOUTH EVERY DAY. APPT NEEDED FOR FURTHER REFILLS, Disp: 90 tablet, Rfl: 3 .  ipratropium-albuterol (DUONEB) 0.5-2.5 (3) MG/3ML SOLN, Take 3 mLs by nebulization every 4 (four) hours., Disp: 360 mL, Rfl: 1 .  lisinopril (PRINIVIL,ZESTRIL) 10 MG tablet, Take 1 tablet (10 mg total) by mouth daily., Disp: 90 tablet, Rfl: 2 .  metoprolol tartrate (LOPRESSOR) 25 MG tablet, TAKE 1 TABLET BY MOUTH TWICE A DAY, Disp: 30 tablet, Rfl: 0 .  rosuvastatin (CRESTOR) 5 MG tablet, Take 5 mg every other day, Disp: 30 tablet, Rfl: 6 .  tiotropium (SPIRIVA HANDIHALER) 18 MCG inhalation capsule, Place 1 capsule (18 mcg total) into inhaler and inhale daily., Disp: 30 capsule, Rfl: 12 .  traMADol (ULTRAM) 50 MG tablet, Take 1-2 tablets every 8 hours as needed for pain., Disp: 60 tablet, Rfl: 0 .  furosemide (LASIX) 40 MG tablet, Take 1 tablet (40 mg total) by mouth 2 (two) times daily., Disp: 30 tablet, Rfl: 5  Allergies  Allergen Reactions  . Penicillins Other (See Comments)    Doesn't remember  Has patient had a PCN reaction causing immediate rash, facial/tongue/throat swelling, SOB or lightheadedness with hypotension: unknown Has patient had a PCN reaction causing severe rash involving mucus membranes or  skin necrosis: unknown Has patient had a PCN reaction that required hospitalization unknown Has patient had a PCN reaction occurring within the last 10 years: unknown If all of the above answers are "NO", then may proceed with Cephalosporin use.         Objective:  Physical Exam  General: AAO x3, NAD  Dermatological: Nails are hypertrophic, dystrophic, brittle, discolored, elongated 10. No surrounding redness or drainage. Tenderness nails 1-5 bilaterally.  There is mild erythema to left fifth toe.  This is more from irritation.  There is no increase in warmth there is no edema.  There is no open sores.  There is no fluctuation or crepitation or any malodor.  No open lesions or pre-ulcerative  lesions are identified today.  Vascular: Dorsalis Pedis artery and Posterior Tibial artery pedal pulses decreased.  There is no pain with calf compression, swelling, warmth, erythema.   Neruologic: Grossly intact via light touch bilateral.   Musculoskeletal: No gross boney pedal deformities bilateral. No pain, crepitus, or limitation noted with foot and ankle range of motion bilateral. Muscular strength 5/5 in all groups tested bilateral.  Assessment:   Symptomatic onychomycosis, left fifth toe irritation; PAD  Plan:  -Treatment options discussed including all alternatives, risks, and complications -Etiology of symptoms were discussed -I did ABIs in the office.  They were both read as "PAD".  There is a similar place consult for Dr. Allyson Sabal and order arterial studies.  -Nails sharply debrided x10 without any complications or bleeding -Mild erythema to the left fifth toe but is not appear to be infected there is no open sores.  Monitor daily.  Discussed importance daily foot inspection and proper shoe gear.  Return in about 9 weeks (around 10/22/2018).  Vivi Barrack DPM

## 2018-08-26 NOTE — Telephone Encounter (Signed)
Faxed doppler order to [facility] - doppler lab and referral to Main Scheduling.

## 2018-08-26 NOTE — Telephone Encounter (Signed)
-----   Message from Vivi Barrack, DPM sent at 08/26/2018  7:58 AM EDT -----  Can you please order arterial studies and put in for a consult with Dr. Allyson Sabal for PAD? Thanks.

## 2018-09-04 ENCOUNTER — Other Ambulatory Visit: Payer: Self-pay

## 2018-09-04 ENCOUNTER — Other Ambulatory Visit: Payer: Self-pay | Admitting: Cardiology

## 2018-09-04 MED ORDER — METOPROLOL TARTRATE 25 MG PO TABS: 25.0000 mg | ORAL_TABLET | Freq: Two times a day (BID) | ORAL | 11 refills | Status: DC

## 2018-09-04 NOTE — Telephone Encounter (Signed)
Furosemide refilled. 

## 2018-10-11 ENCOUNTER — Ambulatory Visit (HOSPITAL_COMMUNITY)
Admission: RE | Admit: 2018-10-11 | Discharge: 2018-10-11 | Disposition: A | Discharge status: Home / Self Care | Payer: Medicare Other | Source: Ambulatory Visit | Attending: Cardiology | Admitting: Cardiology

## 2018-10-11 ENCOUNTER — Other Ambulatory Visit: Payer: Self-pay

## 2018-10-11 DIAGNOSIS — I739 Peripheral vascular disease, unspecified: Secondary | ICD-10-CM | POA: Diagnosis not present

## 2018-10-11 DIAGNOSIS — M79675 Pain in left toe(s): Secondary | ICD-10-CM | POA: Diagnosis not present

## 2018-10-11 DIAGNOSIS — M79674 Pain in right toe(s): Secondary | ICD-10-CM | POA: Diagnosis not present

## 2018-10-11 DIAGNOSIS — L539 Erythematous condition, unspecified: Secondary | ICD-10-CM | POA: Insufficient documentation

## 2018-10-22 ENCOUNTER — Encounter (INDEPENDENT_AMBULATORY_CARE_PROVIDER_SITE_OTHER): Payer: Medicare Other | Admitting: Podiatry

## 2018-10-22 ENCOUNTER — Other Ambulatory Visit: Payer: Self-pay

## 2018-10-22 ENCOUNTER — Encounter: Payer: Self-pay | Admitting: Podiatry

## 2018-10-23 NOTE — Progress Notes (Signed)
Patient left without being seen today.  He was in the treatment room for less than 5 minutes and he states that he waited long enough and did not want to be seen.  He states that he did not know why he was even at the appointment today and is having no issues apparently.

## 2018-11-01 ENCOUNTER — Encounter: Payer: Self-pay | Admitting: Cardiovascular Disease

## 2018-11-01 ENCOUNTER — Ambulatory Visit (INDEPENDENT_AMBULATORY_CARE_PROVIDER_SITE_OTHER): Payer: Medicare Other | Admitting: Cardiovascular Disease

## 2018-11-01 ENCOUNTER — Other Ambulatory Visit: Payer: Self-pay

## 2018-11-01 VITALS — BP 93/56 | HR 83 | Temp 97.7°F | Ht 64.0 in | Wt 138.6 lb

## 2018-11-01 DIAGNOSIS — I10 Essential (primary) hypertension: Secondary | ICD-10-CM

## 2018-11-01 DIAGNOSIS — I739 Peripheral vascular disease, unspecified: Secondary | ICD-10-CM | POA: Insufficient documentation

## 2018-11-01 NOTE — Assessment & Plan Note (Signed)
Cory Dennis was referred to me by Dr. Swaziland for evaluation of PAD.  He has a history of CAD status post remote CABG and ischemic cardiomyopathy.  He has multiple cardiac risk factors including ongoing tobacco abuse.  He has left greater than right lower extremity lifestyle limiting claudication with recent Doppler studies performed 10/11/2018 revealing ABIs in the 0.5 range bilaterally occluded common femorals bilaterally, occluded SFAs bilaterally.  I cannot feel good right radial pulse.  His serum creatinine is 1.57.  I do not think he is a good candidate to undergo angiography or any interventional procedure at this time given his lack of access.

## 2018-11-01 NOTE — Progress Notes (Signed)
11/01/2018 Cory Dennis   08-16-1945  [id number]  Primary Physician Tanya Nones Priscille Heidelberg, MD Primary Cardiologist: Runell Gess MD FACP, [place], [place], [place]  HPI:  Cory Dennis is a 73 y.o. thin and chronically ill-appearing widowed Caucasian male father of 2 children referred by Dr. Swaziland for peripheral vascular valuation because of lifestyle limiting claudication.  His risk factors include over 50 pack years of tobacco abuse continue to smoke as well as treated hypertension hyperlipidemia.  He does have ischemic heart disease status post CABG in 2011 with ischemic cardiomyopathy and EF in the 25% range.  He has lifestyle limiting claudication left greater than right with recent Dopplers performed 10/11/2018 revealing ABIs in the 0.5 range bilaterally, occluded SFAs bilaterally and occluded common femoral arteries.   No outpatient medications have been marked as taking for the 11/01/18 encounter (Office Visit) with Runell Gess, MD.     Allergies  Allergen Reactions  . Penicillins Other (See Comments)    Doesn't remember  Has patient had a PCN reaction causing immediate rash, facial/tongue/throat swelling, SOB or lightheadedness with hypotension: unknown Has patient had a PCN reaction causing severe rash involving mucus membranes or skin necrosis: unknown Has patient had a PCN reaction that required hospitalization unknown Has patient had a PCN reaction occurring within the last 10 years: unknown If all of the above answers are "NO", then may proceed with Cephalosporin use.     Social History   Socioeconomic History  . Marital status: Single    Spouse name: Not on file  . Number of children: Not on file  . Years of education: Not on file  . Highest education level: Not on file  Occupational History  . Occupation: Research scientist (physical sciences)  . Financial resource strain: Not on file  . Food insecurity:    Worry: Not on file    Inability: Not on file  .  Transportation needs:    Medical: Not on file    Non-medical: Not on file  Tobacco Use  . Smoking status: Current Every Day Smoker    Packs/day: 1.00    Years: 42.00    Pack years: 42.00    Types: Cigarettes  . Smokeless tobacco: Never Used  . Tobacco comment: working on it  Substance and Sexual Activity  . Alcohol use: Yes    Alcohol/week: 12.0 - 20.0 standard drinks    Types: 12 - 20 Cans of beer per week  . Drug use: No  . Sexual activity: Not Currently  Lifestyle  . Physical activity:    Days per week: 0 days    Minutes per session: Not on file  . Stress: Not on file  Relationships  . Social connections:    Talks on phone: Not on file    Gets together: Not on file    Attends religious service: Not on file    Active member of club or organization: Not on file    Attends meetings of clubs or organizations: Not on file    Relationship status: Not on file  . Intimate partner violence:    Fear of current or ex partner: Not on file    Emotionally abused: Not on file    Physically abused: Not on file    Forced sexual activity: Not on file  Other Topics Concern  . Not on file  Social History Narrative  . Not on file     Review of Systems: General: negative for chills, fever,  night sweats or weight changes.  Cardiovascular: negative for chest pain, dyspnea on exertion, edema, orthopnea, palpitations, paroxysmal nocturnal dyspnea or shortness of breath Dermatological: negative for rash Respiratory: negative for cough or wheezing Urologic: negative for hematuria Abdominal: negative for nausea, vomiting, diarrhea, bright red blood per rectum, melena, or hematemesis Neurologic: negative for visual changes, syncope, or dizziness All other systems reviewed and are otherwise negative except as noted above.    Blood pressure (!) 93/56, pulse 83, temperature 97.7 F (36.5 C), height 5\' 4"  (1.626 m), weight 138 lb 9.6 oz (62.9 kg).  General appearance: alert and no distress  Neck: no adenopathy, no JVD, supple, symmetrical, trachea midline, thyroid not enlarged, symmetric, no tenderness/mass/nodules and Soft right carotid bruit Lungs: clear to auscultation bilaterally Heart: regular rate and rhythm, S1, S2 normal, no murmur, click, rub or gallop Extremities: Faint right femoral pulse, absent left femoral pulse Pulses: Faint right femoral pulse, absent left femoral pulse Skin: Skin color, texture, turgor normal. No rashes or lesions Neurologic: Alert and oriented X 3, normal strength and tone. Normal symmetric reflexes. Normal coordination and gait  EKG not performed today  ASSESSMENT AND PLAN:   Peripheral arterial disease [facility]) Mr. Chiaro was referred to me by Dr. Swaziland for evaluation of PAD.  He has a history of CAD status post remote CABG and ischemic cardiomyopathy.  He has multiple cardiac risk factors including ongoing tobacco abuse.  He has left greater than right lower extremity lifestyle limiting claudication with recent Doppler studies performed 10/11/2018 revealing ABIs in the 0.5 range bilaterally occluded common femorals bilaterally, occluded SFAs bilaterally.  I cannot feel good right radial pulse.  His serum creatinine is 1.57.  I do not think he is a good candidate to undergo angiography or any interventional procedure at this time given his lack of access.      Runell Gess MD FACP,FACC,FAHA, [facility] 11/01/2018 2:30 PM

## 2018-11-01 NOTE — Patient Instructions (Signed)
Medication Instructions:  Your physician recommends that you continue on your current medications as directed. Please refer to the Current Medication list given to you today.  If you need a refill on your cardiac medications before your next appointment, please call your pharmacy.   Lab work: none If you have labs (blood work) drawn today and your tests are completely normal, you will receive your results only by: Marland Kitchen MyChart Message (if you have MyChart) OR . A paper copy in the mail If you have any lab test that is abnormal or we need to change your treatment, we will call you to review the results.  Testing/Procedures: none  Follow-Up: At [facility], you and your health needs are our priority.  As part of our continuing mission to provide you with exceptional heart care, we have created designated Provider Care Teams.  These Care Teams include your primary Cardiologist (physician) and Advanced Practice Providers (APPs -  Physician Assistants and Nurse Practitioners) who all work together to provide you with the care you need, when you need it. . You will need a follow up appointment as needed. You may see Dr. Allyson Sabal or one of the following Advanced Practice Providers on your designated Care Team:   . Corine Shelter, [place] . Azalee Course, PA-C . Micah Flesher, PA-C . Joni Reining, DNP . Theodore Demark, PA-C . Judy Pimple, PA-C . Marjie Skiff, PA-C

## 2018-11-06 ENCOUNTER — Other Ambulatory Visit: Payer: Self-pay

## 2018-11-06 MED ORDER — ROSUVASTATIN CALCIUM 5 MG PO TABS: ORAL_TABLET | ORAL | 6 refills | Status: DC

## 2018-12-15 ENCOUNTER — Other Ambulatory Visit: Payer: Self-pay | Admitting: Cardiology

## 2018-12-15 ENCOUNTER — Telehealth: Payer: Self-pay | Admitting: Physician Assistant

## 2018-12-15 MED ORDER — FUROSEMIDE 40 MG PO TABS: 40.0000 mg | ORAL_TABLET | Freq: Two times a day (BID) | ORAL | 0 refills | Status: DC

## 2018-12-15 NOTE — Telephone Encounter (Signed)
   The patient called the answering service after-hours today requesting refill on his Lasix 40mg  BID - about to run out. Last Cr 04/2018 was 1.57 with hyponatremia of 131, felt to represent CKD stage III. Cr in 92018 was 1.09 but 1.4-1.5 before that. Patient does not have a way to check his BP at home but per recent OVs in 2020, this was running 98X systolic. Patient is asymptomatic with this. In 08/2018, Lasix was refilled but for only 3 months' time.   I told him I would send in refill of current dose of Lasix for 30 days but would route this message to Dr. Martinique to review if any med adjustments or repeat labs are felt necessary at this time given tendency towards softer BP and CKD/hyponatremia. Further refills will need to be sent in per those recommendations. Will route to his nurse so they can communicate plan to patient.  Pt verbalized understanding and gratitude.  Charlie Pitter

## 2018-12-16 ENCOUNTER — Telehealth: Payer: Self-pay | Admitting: Cardiology

## 2018-12-16 MED ORDER — FUROSEMIDE 40 MG PO TABS: 40.0000 mg | ORAL_TABLET | Freq: Two times a day (BID) | ORAL | 3 refills | Status: DC

## 2018-12-16 MED ORDER — ROSUVASTATIN CALCIUM 5 MG PO TABS: ORAL_TABLET | ORAL | 3 refills | Status: DC

## 2018-12-16 MED ORDER — LISINOPRIL 10 MG PO TABS: 10.0000 mg | ORAL_TABLET | Freq: Every day | ORAL | 3 refills | Status: DC

## 2018-12-16 MED ORDER — METOPROLOL TARTRATE 25 MG PO TABS: 25.0000 mg | ORAL_TABLET | Freq: Two times a day (BID) | ORAL | 3 refills | Status: DC

## 2018-12-16 NOTE — Telephone Encounter (Signed)
New Message    Patient's daughter states they were unable to get medications refilled due to needing labs for kidney function, and she states patient already had that done.  Would like to speak to you states your familiar with patients case.

## 2018-12-16 NOTE — Telephone Encounter (Signed)
Spoke to patient's care giver Kenney Houseman.Stated patient needs 90 day prescriptions sent in on all medications Dr.Jordan prescribes.Advised I will send refills to pharmacy.

## 2019-01-16 NOTE — Telephone Encounter (Signed)
Spoke to patient's care giver Kenney Houseman 12/16/18 advised patient will need to have a bmet done.

## 2019-02-05 ENCOUNTER — Other Ambulatory Visit: Payer: Self-pay | Admitting: Cardiology

## 2019-04-14 ENCOUNTER — Telehealth: Payer: Self-pay | Admitting: Cardiology

## 2019-04-14 NOTE — Telephone Encounter (Signed)
Sounds like he needs to see Dr Gwenlyn Found again.   Peter Martinique MD, [facility]

## 2019-04-14 NOTE — Telephone Encounter (Signed)
Spoke to patient's caregiver Lavella Lemons.Dr.Jordan advised to see Dr.Berry.Appointment scheduled with Dr.Berry 04/22/19 at 3:15 pm.

## 2019-04-14 NOTE — Telephone Encounter (Signed)
Patient caregiver Cory Dennis;  She states that his foot is worsening- and she does not know what else to do.    Per Dr.Berry last note: Cory Dennis was referred to me by Dr. Martinique for evaluation of PAD.  He has a history of CAD status post remote CABG and ischemic cardiomyopathy.  He has multiple cardiac risk factors including ongoing tobacco abuse.  He has left greater than right lower extremity lifestyle limiting claudication with recent Doppler studies performed 10/11/2018 revealing ABIs in the 0.5 range bilaterally occluded common femorals bilaterally, occluded SFAs bilaterally.  I cannot feel good right radial pulse.  His serum creatinine is 1.57.  I do not think he is a good candidate to undergo angiography or any interventional procedure at this time given his lack of access.   She would like to know what else to do- she is getting frustrated. Advised I would route message to Dr.Jordan to advise.

## 2019-04-14 NOTE — Telephone Encounter (Signed)
New message    Cory Dennis calling,    Patient complaining of left foot swelling, states foot feels cold, numbness, red in color

## 2019-04-21 ENCOUNTER — Ambulatory Visit: Payer: Medicare Other | Admitting: Cardiovascular Disease

## 2019-04-22 ENCOUNTER — Ambulatory Visit: Payer: Medicare Other | Admitting: Cardiovascular Disease

## 2019-05-06 ENCOUNTER — Telehealth: Payer: Self-pay | Admitting: Cardiovascular Disease

## 2019-05-06 NOTE — Telephone Encounter (Signed)
Per stepdaughter granddaughter is coming to appt with pt she has  POA .Pt has poor memory /cy

## 2019-05-06 NOTE — Telephone Encounter (Signed)
° ° °  Daughter requesting to join patient during 12/9 appointment, patient doesn't give enough information to provide care at home

## 2019-05-07 ENCOUNTER — Other Ambulatory Visit: Payer: Self-pay

## 2019-05-07 ENCOUNTER — Ambulatory Visit (INDEPENDENT_AMBULATORY_CARE_PROVIDER_SITE_OTHER): Payer: Medicare Other | Admitting: Cardiovascular Disease

## 2019-05-07 ENCOUNTER — Encounter: Payer: Self-pay | Admitting: Cardiovascular Disease

## 2019-05-07 DIAGNOSIS — I739 Peripheral vascular disease, unspecified: Secondary | ICD-10-CM

## 2019-05-07 NOTE — Assessment & Plan Note (Signed)
Cory Dennis returns today for peripheral vascular follow-up.  I last saw him in the office 11/01/2018 as referral from Dr. Jacqualyn Posey, his podiatrist.  He has ABIs in the 8.5 range bilaterally with occluded common femoral and SFAs.  I do not think he has an endovascular option given lack of access.  In addition, I do not think he is a surgical candidate given his severe LV dysfunction and prior bypass surgery as well as continued tobacco abuse.  I contemplated doing a CT angiogram but his serum creatinine of 1.6 precludes this.  There are no invasive options available to him at this time.

## 2019-05-07 NOTE — Progress Notes (Signed)
05/07/2019 DREYDEN ROHRMAN   [birth year]  [id number]  Primary Physician Dennard Schaumann Cammie Mcgee, MD Primary Cardiologist: Lorretta Harp MD FACP, [place], [place], [place]  HPI:  Cory Dennis is a 73 y.o.  thin and chronically ill-appearing widowed Caucasian male father of 2 children referred by Dr. Martinique for peripheral vascular valuation because of lifestyle limiting claudication.  I last saw him in the office 11/01/2018. His risk factors include over 50 pack years of tobacco abuse continue to smoke as well as treated hypertension hyperlipidemia.  He does have ischemic heart disease status post CABG in 2011 with ischemic cardiomyopathy and EF in the 25% range.  He has lifestyle limiting claudication left greater than right with recent Dopplers performed 10/11/2018 revealing ABIs in the 0.5 range bilaterally, occluded SFAs bilaterally and occluded common femoral arteries.  He returns today for follow-up.  He complains of chronic left foot pain with some swelling and purplish discoloration.  His right foot is not as affected.  He does live alone and is minimally ambulatory.  He mostly lives a sedentary lifestyle.  There is no evidence of critical limb ischemia or an open wound.  Unfortunately, given his Doppler studies at do not think he has an endovascular option or surgical option for revascularization.   Current Meds  Medication Sig  . albuterol (PROAIR HFA) 108 (90 Base) MCG/ACT inhaler INHALE 2 PUFFS INTO THE LUNGS EVERY 6 (SIX) HOURS AS NEEDED FOR SHORTNESS OF BREATH.  Marland Kitchen aspirin 81 MG tablet Take 81 mg by mouth daily.  . folic acid (FOLVITE) 1 MG tablet TAKE 1 TABLET BY MOUTH EVERY DAY. APPT NEEDED FOR FURTHER REFILLS  . furosemide (LASIX) 40 MG tablet Take 1 tablet (40 mg total) by mouth 2 (two) times daily.  Marland Kitchen ipratropium-albuterol (DUONEB) 0.5-2.5 (3) MG/3ML SOLN Take 3 mLs by nebulization every 4 (four) hours.  Marland Kitchen lisinopril (ZESTRIL) 10 MG tablet Take 1 tablet (10 mg total) by mouth daily.   . metoprolol tartrate (LOPRESSOR) 25 MG tablet Take 1 tablet (25 mg total) by mouth 2 (two) times daily.  . rosuvastatin (CRESTOR) 5 MG tablet Take 5 mg every other day  . tiotropium (SPIRIVA HANDIHALER) 18 MCG inhalation capsule Place 1 capsule (18 mcg total) into inhaler and inhale daily.  . traMADol (ULTRAM) 50 MG tablet Take 1-2 tablets every 8 hours as needed for pain.     Allergies  Allergen Reactions  . Penicillins Other (See Comments)    Doesn't remember  Has patient had a PCN reaction causing immediate rash, facial/tongue/throat swelling, SOB or lightheadedness with hypotension: unknown Has patient had a PCN reaction causing severe rash involving mucus membranes or skin necrosis: unknown Has patient had a PCN reaction that required hospitalization unknown Has patient had a PCN reaction occurring within the last 10 years: unknown If all of the above answers are "NO", then may proceed with Cephalosporin use.     Social History   Socioeconomic History  . Marital status: Single    Spouse name: Not on file  . Number of children: Not on file  . Years of education: Not on file  . Highest education level: Not on file  Occupational History  . Occupation: Manufacturing systems engineer  . Financial resource strain: Not on file  . Food insecurity    Worry: Not on file    Inability: Not on file  . Transportation needs    Medical: Not on file    Non-medical: Not on file  Tobacco Use  . Smoking status: Current Every Day Smoker    Packs/day: 1.00    Years: 42.00    Pack years: 42.00    Types: Cigarettes  . Smokeless tobacco: Never Used  . Tobacco comment: working on it  Substance and Sexual Activity  . Alcohol use: Yes    Alcohol/week: 12.0 - 20.0 standard drinks    Types: 12 - 20 Cans of beer per week  . Drug use: No  . Sexual activity: Not Currently  Lifestyle  . Physical activity    Days per week: 0 days    Minutes per session: Not on file  . Stress: Not on file   Relationships  . Social Musician on phone: Not on file    Gets together: Not on file    Attends religious service: Not on file    Active member of club or organization: Not on file    Attends meetings of clubs or organizations: Not on file    Relationship status: Not on file  . Intimate partner violence    Fear of current or ex partner: Not on file    Emotionally abused: Not on file    Physically abused: Not on file    Forced sexual activity: Not on file  Other Topics Concern  . Not on file  Social History Narrative  . Not on file     Review of Systems: General: negative for chills, fever, night sweats or weight changes.  Cardiovascular: negative for chest pain, dyspnea on exertion, edema, orthopnea, palpitations, paroxysmal nocturnal dyspnea or shortness of breath Dermatological: negative for rash Respiratory: negative for cough or wheezing Urologic: negative for hematuria Abdominal: negative for nausea, vomiting, diarrhea, bright red blood per rectum, melena, or hematemesis Neurologic: negative for visual changes, syncope, or dizziness All other systems reviewed and are otherwise negative except as noted above.    Blood pressure 102/66, pulse (!) 55, temperature (!) 96.8 F (36 C), height 5\' 4"  (1.626 m), weight 132 lb (59.9 kg).  General appearance: alert and no distress Neck: no adenopathy, no JVD, supple, symmetrical, trachea midline, thyroid not enlarged, symmetric, no tenderness/mass/nodules and Soft right carotid bruit Lungs: clear to auscultation bilaterally Heart: regular rate and rhythm, S1, S2 normal, no murmur, click, rub or gallop Extremities: Cyanosis of left greater than right foot Pulses: Absent pedal pulses Skin: Skin color, texture, turgor normal. No rashes or lesions Neurologic: Mental status: Alert, oriented, thought content appropriate  EKG not performed today  ASSESSMENT AND PLAN:   Peripheral arterial disease [facility]) Mr. Gendron  returns today for peripheral vascular follow-up.  I last saw him in the office 11/01/2018 as referral from Dr. 01/01/2019, his podiatrist.  He has ABIs in the 8.5 range bilaterally with occluded common femoral and SFAs.  I do not think he has an endovascular option given lack of access.  In addition, I do not think he is a surgical candidate given his severe LV dysfunction and prior bypass surgery as well as continued tobacco abuse.  I contemplated doing a CT angiogram but his serum creatinine of 1.6 precludes this.  There are no invasive options available to him at this time.      Ardelle Anton MD FACP,FACC,FAHA, [facility] 05/07/2019 11:18 AM

## 2019-05-07 NOTE — Patient Instructions (Addendum)

## 2019-06-03 ENCOUNTER — Telehealth: Payer: Self-pay | Admitting: Cardiovascular Disease

## 2019-06-03 NOTE — Telephone Encounter (Signed)
Spoke with pt and not sure what message pt is referring to no note or recent tests results where a message was left ./cy

## 2019-06-03 NOTE — Telephone Encounter (Signed)
Patient states he received a call from our office. I don't see any notes where someone states that they called. He said no name was given but just told him to call the office. He is a patient of Dr. Allyson Sabal and Dr. [place]. Went ahead and scheduled his overdue office visit with Dr. [place].

## 2019-06-30 ENCOUNTER — Telehealth: Payer: Self-pay | Admitting: Cardiology

## 2019-06-30 NOTE — Telephone Encounter (Signed)
Pt daughter is stating that pt foot is infected now. Asked about the wound. She states its not a wound it's "4 little puncture holes on the back of his foot that clear and yellow pus are coming out of". She also stated that he has 2 blisters on his big and pinky toe. Pt daughter very frustrated on phone. She stated she felt like no one was helping her dad and she has been to 3 different providers about it. Advised pt daughter that she needs to get the infection treated first before Dr. Allyson Sabal looks at pt foot again. Advised pt daughter to call PCP or take him to urgent care and let him get treated and call us back. She verbalized understanding.

## 2019-06-30 NOTE — Telephone Encounter (Signed)
Agree 

## 2019-06-30 NOTE — Telephone Encounter (Signed)
Cory Dennis is calling stating Dr. Allyson Sabal stated a surgery cannot be performed for the patient's foot due to the extent of his health. She states she believes the foot has gotten infected & there is puss coming out of the back of the foot. She feels the patient needs antibiotics & would like to know what to do in regards to this. Please advise.

## 2019-07-01 ENCOUNTER — Other Ambulatory Visit: Payer: Self-pay

## 2019-07-01 ENCOUNTER — Ambulatory Visit (INDEPENDENT_AMBULATORY_CARE_PROVIDER_SITE_OTHER): Payer: Medicare HMO | Admitting: Family Medicine

## 2019-07-01 ENCOUNTER — Encounter: Payer: Self-pay | Admitting: Family Medicine

## 2019-07-01 ENCOUNTER — Ambulatory Visit
Admission: RE | Admit: 2019-07-01 | Discharge: 2019-07-01 | Disposition: A | Discharge status: Home / Self Care | Payer: Medicare Other | Source: Ambulatory Visit | Attending: Family Medicine | Admitting: Family Medicine

## 2019-07-01 VITALS — BP 100/62 | HR 86 | Temp 96.7°F | Resp 18 | Ht 64.0 in | Wt 134.0 lb

## 2019-07-01 DIAGNOSIS — M869 Osteomyelitis, unspecified: Secondary | ICD-10-CM | POA: Diagnosis not present

## 2019-07-01 DIAGNOSIS — M86172 Other acute osteomyelitis, left ankle and foot: Secondary | ICD-10-CM | POA: Diagnosis not present

## 2019-07-01 DIAGNOSIS — L03119 Cellulitis of unspecified part of limb: Secondary | ICD-10-CM

## 2019-07-01 DIAGNOSIS — I739 Peripheral vascular disease, unspecified: Secondary | ICD-10-CM

## 2019-07-01 DIAGNOSIS — R600 Localized edema: Secondary | ICD-10-CM | POA: Diagnosis not present

## 2019-07-01 MED ORDER — SULFAMETHOXAZOLE-TRIMETHOPRIM 800-160 MG PO TABS: 1.0000 | ORAL_TABLET | Freq: Two times a day (BID) | ORAL | 0 refills | Status: DC

## 2019-07-01 MED ORDER — CLINDAMYCIN HCL 300 MG PO CAPS: 300.0000 mg | ORAL_CAPSULE | Freq: Three times a day (TID) | ORAL | 0 refills | Status: DC

## 2019-07-01 NOTE — Telephone Encounter (Signed)
Patient needs to be sent back to the wound care center or to Dr. Ardelle Anton for further evaluation and treatment.

## 2019-07-01 NOTE — Progress Notes (Signed)
Subjective:    Patient ID: Cory Dennis, male    DOB: May 06, 1946, 74 y.o.   MRN: [id number]  HPI  Patient states that his left foot has been red for over a year.  He has been told that he has peripheral artery disease and that he is not a candidate for any revascularization.  I reviewed arterial ultrasounds from May 2020.  ABI of the left foot was 0.45.  TBI was 0.24.  The patient was referred to Dr. Allyson Sabal.  I have copied his assessment and plan from his last visit in 2020.  Peripheral arterial disease [facility]) Mr. Demelo returns today for peripheral vascular follow-up.  I last saw him in the office 11/01/2018 as referral from Dr. Ardelle Anton, his podiatrist.  He has ABIs in the 8.5 range bilaterally with occluded common femoral and SFAs.  I do not think he has an endovascular option given lack of access.  In addition, I do not think he is a surgical candidate given his severe LV dysfunction and prior bypass surgery as well as continued tobacco abuse.  I contemplated doing a CT angiogram but his serum creatinine of 1.6 precludes this.  There are no invasive options available to him at this time.  Patient is accompanied today with his family member.  He has 2 large bullae that have developed on the dorsum of his left foot.  1 over the first MTP joint and 1 over the fourth and fifth MTP joint.  The toes and the midfoot are erythematous.  They are warm and painful.  He also has erythema and pain covering the medial and plantar surface of his calcaneus.  The foot is swollen and tender.  The patient is now having pain at rest and with ambulation.  However they state that the redness has been present for quite some time.  There has not been a sudden change.  They are here today requesting antibiotics for an infection.  Please see the photograph below.    The skin over the third toe is white with an area of necrosis at the PIP joint.  Past Medical History:  Diagnosis Date  . Alcohol abuse   . Anemia   .  Anxiety   . Arthritis   . CAD (coronary artery disease)    a. s/p CABG in 2011 with LIMA-LAD, RIMA-distal RCA, and sequential SVG-OM1/OM 2 complicated by nonunion of sternum)  . CHF (congestive heart failure) (HCC)    a. EF 25% by echo in 07/2014. Declined ICD placement.   . Chronic systolic CHF (congestive heart failure) (HCC) 11/06/2014  . COPD (chronic obstructive pulmonary disease) (HCC)   . Depression   . Emphysema of lung (HCC)   . Fatigue   . Hypertension   . Ischemic cardiomyopathy   . Musculoskeletal chest pain   . Myocardial infarction (HCC)   . SOB (shortness of breath)   . Syncope   . Tobacco abuse    Past Surgical History:  Procedure Laterality Date  . CORONARY ARTERY BYPASS GRAFT  2011   4v CABG [facility] in [place] in 06/2009 with LIMA to LAD, RIMA to distal RCA, sequential SVG to OM1/OM 2  . CRANIOTOMY Left 05/05/2015   Procedure: Left CRANIOTOMY HEMATOMA EVACUATION SUBDURAL;  Surgeon: Tia Alert, MD;  Location: [facility];  Service: Neurosurgery;  Laterality: Left;  . Left ventricular aneurysm repair     Current Outpatient Medications on File Prior to Visit  Medication Sig Dispense Refill  .  albuterol (PROAIR HFA) 108 (90 Base) MCG/ACT inhaler INHALE 2 PUFFS INTO THE LUNGS EVERY 6 (SIX) HOURS AS NEEDED FOR SHORTNESS OF BREATH. 8.5 Inhaler 0  . aspirin 81 MG tablet Take 81 mg by mouth daily.    . folic acid (FOLVITE) 1 MG tablet TAKE 1 TABLET BY MOUTH EVERY DAY. APPT NEEDED FOR FURTHER REFILLS 90 tablet 1  . furosemide (LASIX) 40 MG tablet Take 1 tablet (40 mg total) by mouth 2 (two) times daily. 180 tablet 3  . ipratropium-albuterol (DUONEB) 0.5-2.5 (3) MG/3ML SOLN Take 3 mLs by nebulization every 4 (four) hours. 360 mL 1  . lisinopril (ZESTRIL) 10 MG tablet Take 1 tablet (10 mg total) by mouth daily. 90 tablet 3  . metoprolol tartrate (LOPRESSOR) 25 MG tablet Take 1 tablet (25 mg total) by mouth 2 (two) times daily. 180 tablet 3  . rosuvastatin (CRESTOR) 5 MG  tablet Take 5 mg every other day 45 tablet 3  . tiotropium (SPIRIVA HANDIHALER) 18 MCG inhalation capsule Place 1 capsule (18 mcg total) into inhaler and inhale daily. 30 capsule 12  . traMADol (ULTRAM) 50 MG tablet Take 1-2 tablets every 8 hours as needed for pain. 60 tablet 0   No current facility-administered medications on file prior to visit.   Allergies  Allergen Reactions  . Penicillins Other (See Comments)    Doesn't remember  Has patient had a PCN reaction causing immediate rash, facial/tongue/throat swelling, SOB or lightheadedness with hypotension: unknown Has patient had a PCN reaction causing severe rash involving mucus membranes or skin necrosis: unknown Has patient had a PCN reaction that required hospitalization unknown Has patient had a PCN reaction occurring within the last 10 years: unknown If all of the above answers are "NO", then may proceed with Cephalosporin use.    Social History   Socioeconomic History  . Marital status: Single    Spouse name: Not on file  . Number of children: Not on file  . Years of education: Not on file  . Highest education level: Not on file  Occupational History  . Occupation: Dealer  Tobacco Use  . Smoking status: Current Every Day Smoker    Packs/day: 1.00    Years: 42.00    Pack years: 42.00    Types: Cigarettes  . Smokeless tobacco: Never Used  . Tobacco comment: working on it  Substance and Sexual Activity  . Alcohol use: Yes    Alcohol/week: 12.0 - 20.0 standard drinks    Types: 12 - 20 Cans of beer per week  . Drug use: No  . Sexual activity: Not Currently  Other Topics Concern  . Not on file  Social History Narrative  . Not on file   Social Determinants of Health   Financial Resource Strain:   . Difficulty of Paying Living Expenses: Not on file  Food Insecurity:   . Worried About Charity fundraiser in the Last Year: Not on file  . Ran Out of Food in the Last Year: Not on file  Transportation Needs:   .  Lack of Transportation (Medical): Not on file  . Lack of Transportation (Non-Medical): Not on file  Physical Activity:   . Days of Exercise per Week: Not on file  . Minutes of Exercise per Session: Not on file  Stress:   . Feeling of Stress : Not on file  Social Connections:   . Frequency of Communication with Friends and Family: Not on file  . Frequency of Social Gatherings  with Friends and Family: Not on file  . Attends Religious Services: Not on file  . Active Member of Clubs or Organizations: Not on file  . Attends Banker Meetings: Not on file  . Marital Status: Not on file  Intimate Partner Violence:   . Fear of Current or Ex-Partner: Not on file  . Emotionally Abused: Not on file  . Physically Abused: Not on file  . Sexually Abused: Not on file     Review of Systems  All other systems reviewed and are negative.      Objective:   Physical Exam Vitals reviewed.  Cardiovascular:     Rate and Rhythm: Normal rate and regular rhythm.     Heart sounds: Normal heart sounds.  Pulmonary:     Effort: Pulmonary effort is normal. No respiratory distress.     Breath sounds: Wheezing present.  Musculoskeletal:        General: Swelling and tenderness present.     Left lower leg: Edema present.  Skin:    Findings: Erythema present.     I am unable to palpate a dorsalis pedis or posterior tibialis pulse in the left foot.      Assessment & Plan:  Osteomyelitis of ankle and foot (HCC) - Plan: DG Foot Complete Left  PAD (peripheral artery disease) (HCC)  Cellulitis of foot  Patient clearly has cellulitis in the left foot.  However given the area of necrosis over the third toe and the white, nonblanchable skin along with his history of peripheral artery disease, I am concerned that may have chronic osteomyelitis as well.  I explained this to the patient.  If this is in fact the case, the patient will likely need a surgical consultation to discuss amputation.  We  will start the patient on antibiotics immediately.  He has a history of allergies to penicillin.  Therefore I will start him on Bactrim double strength tablets to cover MRSA as well as clindamycin to cover anaerobic bacteria given the lack of circulation.  Send the patient immediately for an x-ray of the foot to see if there is evidence of chronic osteomyelitis given the duration of his symptoms.

## 2019-07-07 ENCOUNTER — Other Ambulatory Visit: Payer: Self-pay

## 2019-07-07 ENCOUNTER — Encounter: Payer: Self-pay | Admitting: Family Medicine

## 2019-07-07 ENCOUNTER — Ambulatory Visit (INDEPENDENT_AMBULATORY_CARE_PROVIDER_SITE_OTHER): Payer: Medicare HMO | Admitting: Family Medicine

## 2019-07-07 VITALS — BP 110/70 | HR 86 | Temp 97.2°F | Resp 20 | Ht 64.0 in | Wt 124.0 lb

## 2019-07-07 DIAGNOSIS — I739 Peripheral vascular disease, unspecified: Secondary | ICD-10-CM

## 2019-07-07 DIAGNOSIS — L03119 Cellulitis of unspecified part of limb: Secondary | ICD-10-CM

## 2019-07-07 NOTE — Progress Notes (Signed)
Subjective:    Patient ID: Cory Dennis, male    DOB: December 25, 1945, 74 y.o.   MRN: [id number]  HPI  07/01/19 Patient states that his left foot has been red for over a year.  He has been told that he has peripheral artery disease and that he is not a candidate for any revascularization.  I reviewed arterial ultrasounds from May 2020.  ABI of the left foot was 0.45.  TBI was 0.24.  The patient was referred to Cory Dennis.  I have copied his assessment and plan from his last visit in 2020.  Peripheral arterial disease [facility]) Cory Dennis returns today for peripheral vascular follow-up.  I last saw him in the office 11/01/2018 as referral from Cory Dennis, his podiatrist.  He has ABIs in the 8.5 range bilaterally with occluded common femoral and SFAs.  I do not think he has an endovascular option given lack of access.  In addition, I do not think he is a surgical candidate given his severe LV dysfunction and prior bypass surgery as well as continued tobacco abuse.  I contemplated doing a CT angiogram but his serum creatinine of 1.6 precludes this.  There are no invasive options available to him at this time.  Patient is accompanied today with his family member.  He has 2 large bullae that have developed on the dorsum of his left foot.  1 over the first MTP joint and 1 over the fourth and fifth MTP joint.  The toes and the midfoot are erythematous.  They are warm and painful.  He also has erythema and pain covering the medial and plantar surface of his calcaneus.  The foot is swollen and tender.  The patient is now having pain at rest and with ambulation.  However they state that the redness has been present for quite some time.  There has not been a sudden change.  They are here today requesting antibiotics for an infection.  Please see the photograph below.    The skin over the third toe is white with an area of necrosis at the PIP joint.  At that time, my plan was: Patient clearly has cellulitis in the  left foot.  However given the area of necrosis over the third toe and the white, nonblanchable skin along with his history of peripheral artery disease, I am concerned that may have chronic osteomyelitis as well.  I explained this to the patient.  If this is in fact the case, the patient will likely need a surgical consultation to discuss amputation.  We will start the patient on antibiotics immediately.  He has a history of allergies to penicillin.  Therefore I will start him on Bactrim double strength tablets to cover MRSA as well as clindamycin to cover anaerobic bacteria given the lack of circulation.  Send the patient immediately for an x-ray of the foot to see if there is evidence of chronic osteomyelitis given the duration of his symptoms.  07/07/19 Xray showed no osteomyelitis.  The bullae shown in the photograph above have ruptured.  The erythema has faded and is now left with a dusky violaceous skin.  Skin is cold to the touch.  The patient reports burning pain in all of his toes.  The edema in the midfoot has improved.  Therefore I believe the acute infection is responding to the antibiotics.  The swelling is down in the toes however there is still evidence of dry gangrene in the third toe.  Past Medical  History:  Diagnosis Date  . Alcohol abuse   . Anemia   . Anxiety   . Arthritis   . CAD (coronary artery disease)    a. s/p CABG in 2011 with LIMA-LAD, RIMA-distal RCA, and sequential SVG-OM1/OM 2 complicated by nonunion of sternum)  . CHF (congestive heart failure) (HCC)    a. EF 25% by echo in 07/2014. Declined ICD placement.   . Chronic systolic CHF (congestive heart failure) (HCC) 11/06/2014  . COPD (chronic obstructive pulmonary disease) (HCC)   . Depression   . Emphysema of lung (HCC)   . Fatigue   . Hypertension   . Ischemic cardiomyopathy   . Musculoskeletal chest pain   . Myocardial infarction (HCC)   . SOB (shortness of breath)   . Syncope   . Tobacco abuse    Past  Surgical History:  Procedure Laterality Date  . CORONARY ARTERY BYPASS GRAFT  2011   4v CABG [facility] in [place] in 06/2009 with LIMA to LAD, RIMA to distal RCA, sequential SVG to OM1/OM 2  . CRANIOTOMY Left 05/05/2015   Procedure: Left CRANIOTOMY HEMATOMA EVACUATION SUBDURAL;  Surgeon: Tia Alert, MD;  Location: [facility];  Service: Neurosurgery;  Laterality: Left;  . Left ventricular aneurysm repair     Current Outpatient Medications on File Prior to Visit  Medication Sig Dispense Refill  . albuterol (PROAIR HFA) 108 (90 Base) MCG/ACT inhaler INHALE 2 PUFFS INTO THE LUNGS EVERY 6 (SIX) HOURS AS NEEDED FOR SHORTNESS OF BREATH. 8.5 Inhaler 0  . aspirin 81 MG tablet Take 81 mg by mouth daily.    . clindamycin (CLEOCIN) 300 MG capsule Take 1 capsule (300 mg total) by mouth 3 (three) times daily. 30 capsule 0  . folic acid (FOLVITE) 1 MG tablet TAKE 1 TABLET BY MOUTH EVERY DAY. APPT NEEDED FOR FURTHER REFILLS 90 tablet 1  . furosemide (LASIX) 40 MG tablet Take 1 tablet (40 mg total) by mouth 2 (two) times daily. 180 tablet 3  . ipratropium-albuterol (DUONEB) 0.5-2.5 (3) MG/3ML SOLN Take 3 mLs by nebulization every 4 (four) hours. 360 mL 1  . lisinopril (ZESTRIL) 10 MG tablet Take 1 tablet (10 mg total) by mouth daily. 90 tablet 3  . metoprolol tartrate (LOPRESSOR) 25 MG tablet Take 1 tablet (25 mg total) by mouth 2 (two) times daily. 180 tablet 3  . rosuvastatin (CRESTOR) 5 MG tablet Take 5 mg every other day 45 tablet 3  . sulfamethoxazole-trimethoprim (BACTRIM DS) 800-160 MG tablet Take 1 tablet by mouth 2 (two) times daily. 20 tablet 0  . tiotropium (SPIRIVA HANDIHALER) 18 MCG inhalation capsule Place 1 capsule (18 mcg total) into inhaler and inhale daily. 30 capsule 12  . traMADol (ULTRAM) 50 MG tablet Take 1-2 tablets every 8 hours as needed for pain. 60 tablet 0   No current facility-administered medications on file prior to visit.   Allergies  Allergen Reactions  . Penicillins  Other (See Comments)    Doesn't remember  Has patient had a PCN reaction causing immediate rash, facial/tongue/throat swelling, SOB or lightheadedness with hypotension: unknown Has patient had a PCN reaction causing severe rash involving mucus membranes or skin necrosis: unknown Has patient had a PCN reaction that required hospitalization unknown Has patient had a PCN reaction occurring within the last 10 years: unknown If all of the above answers are "NO", then may proceed with Cephalosporin use.    Social History   Socioeconomic History  . Marital status: Single  Spouse name: Not on file  . Number of children: Not on file  . Years of education: Not on file  . Highest education level: Not on file  Occupational History  . Occupation: Curator  Tobacco Use  . Smoking status: Current Every Day Smoker    Packs/day: 1.00    Years: 42.00    Pack years: 42.00    Types: Cigarettes  . Smokeless tobacco: Never Used  . Tobacco comment: working on it  Substance and Sexual Activity  . Alcohol use: Yes    Alcohol/week: 12.0 - 20.0 standard drinks    Types: 12 - 20 Cans of beer per week  . Drug use: No  . Sexual activity: Not Currently  Other Topics Concern  . Not on file  Social History Narrative  . Not on file   Social Determinants of Health   Financial Resource Strain:   . Difficulty of Paying Living Expenses: Not on file  Food Insecurity:   . Worried About Programme researcher, broadcasting/film/video in the Last Year: Not on file  . Ran Out of Food in the Last Year: Not on file  Transportation Needs:   . Lack of Transportation (Medical): Not on file  . Lack of Transportation (Non-Medical): Not on file  Physical Activity:   . Days of Exercise per Week: Not on file  . Minutes of Exercise per Session: Not on file  Stress:   . Feeling of Stress : Not on file  Social Connections:   . Frequency of Communication with Friends and Family: Not on file  . Frequency of Social Gatherings with Friends and  Family: Not on file  . Attends Religious Services: Not on file  . Active Member of Clubs or Organizations: Not on file  . Attends Banker Meetings: Not on file  . Marital Status: Not on file  Intimate Partner Violence:   . Fear of Current or Ex-Partner: Not on file  . Emotionally Abused: Not on file  . Physically Abused: Not on file  . Sexually Abused: Not on file     Review of Systems  All other systems reviewed and are negative.      Objective:   Physical Exam Vitals reviewed.  Cardiovascular:     Rate and Rhythm: Normal rate and regular rhythm.     Heart sounds: Normal heart sounds.  Pulmonary:     Effort: Pulmonary effort is normal. No respiratory distress.     Breath sounds: Wheezing present.  Musculoskeletal:        General: Tenderness present. No swelling.     Left lower leg: No edema.  Skin:    Findings: Erythema present.     I am unable to palpate a dorsalis pedis or posterior tibialis pulse in the left foot.      Assessment & Plan:  PAD (peripheral artery disease) (HCC)  Cellulitis of foot  The edema and swelling has improved.  However the skin from the midfoot to the tip of the toes is cool to the touch erythematous and violaceous in color.  There is area of dry gangrene on the third toe.  The bullae have ruptured leaving shallow ulcerations in the area as shown above.  I believe the cellulitis is resolving.  He should complete the antibiotic.  However he needs to see the vascular surgeon as soon as possible.  If there is no vascular option for this patient, I would recommend having him see the wound clinic to try to facilitate  healing of the ulcerations on his feet possibly with hyperbaric oxygen.  If this is not viable, the patient may require amputation.  Await the results of vascular surgery consultation

## 2019-07-15 ENCOUNTER — Ambulatory Visit (HOSPITAL_COMMUNITY)
Admission: RE | Admit: 2019-07-15 | Discharge: 2019-07-15 | Disposition: A | Discharge status: Home / Self Care | Payer: Medicare HMO | Source: Ambulatory Visit | Attending: Surgery | Admitting: Surgery

## 2019-07-15 ENCOUNTER — Other Ambulatory Visit: Payer: Self-pay

## 2019-07-15 ENCOUNTER — Ambulatory Visit (INDEPENDENT_AMBULATORY_CARE_PROVIDER_SITE_OTHER): Payer: Medicare HMO | Admitting: Vascular Surgery

## 2019-07-15 VITALS — BP 88/54 | HR 91 | Temp 96.4°F | Resp 16 | Ht 64.0 in | Wt 126.0 lb

## 2019-07-15 DIAGNOSIS — I739 Peripheral vascular disease, unspecified: Secondary | ICD-10-CM | POA: Diagnosis not present

## 2019-07-15 NOTE — Progress Notes (Signed)
Patient name: Cory Dennis MRN: [id number] DOB: 04-24-1946 Sex: male  REASON FOR CONSULT: Left lower extremity nonhealing wound and cellulitis  HPI: Cory Dennis is a 74 y.o. male, with history of COPD, tobacco abuse, CKD, systolic CHF with ischemic cardiomyopathy and EF 25%, coronary artery disease status post remote MI that presents for evaluation of left lower extremity cellulitis and foot wound. Patient is unable to quantify how long he has had trouble with his left lower extremity. In review of the records, he is previously has been evaluated by Dr. Allyson Sabal with cardiology. Dr. Allyson Sabal did not offer him any invasive intervention given occluded common femoral and SFAs bilaterally and also felt he was not likely a surgical candidate given severe LV dysfunction.  Patient states he walks very little but is ambulatory. He is still smoking at least a pack to pack and half a day. Denies any previous lower extremity interventions. States he has a fair amount of pain in the foot at night. States is very attached to his leg. Patient really unable to quantify the duration of his foot wounds and when the foot started looking worse.  Barely able to wiggle his toes. In reviewing the notes this has been ongoing since last year.  Past Medical History:  Diagnosis Date  . Alcohol abuse   . Anemia   . Anxiety   . Arthritis   . CAD (coronary artery disease)    a. s/p CABG in 2011 with LIMA-LAD, RIMA-distal RCA, and sequential SVG-OM1/OM 2 complicated by nonunion of sternum)  . CHF (congestive heart failure) (HCC)    a. EF 25% by echo in 07/2014. Declined ICD placement.   . Chronic systolic CHF (congestive heart failure) (HCC) 11/06/2014  . COPD (chronic obstructive pulmonary disease) (HCC)   . Depression   . Emphysema of lung (HCC)   . Fatigue   . Hypertension   . Ischemic cardiomyopathy   . Musculoskeletal chest pain   . Myocardial infarction (HCC)   . SOB (shortness of breath)   . Syncope   .  Tobacco abuse     Past Surgical History:  Procedure Laterality Date  . CORONARY ARTERY BYPASS GRAFT  2011   4v CABG [facility] in [place] in 06/2009 with LIMA to LAD, RIMA to distal RCA, sequential SVG to OM1/OM 2  . CRANIOTOMY Left 05/05/2015   Procedure: Left CRANIOTOMY HEMATOMA EVACUATION SUBDURAL;  Surgeon: Tia Alert, MD;  Location: [facility];  Service: Neurosurgery;  Laterality: Left;  . Left ventricular aneurysm repair      Family History  Family history unknown: Yes    SOCIAL HISTORY: Social History   Socioeconomic History  . Marital status: Single    Spouse name: Not on file  . Number of children: Not on file  . Years of education: Not on file  . Highest education level: Not on file  Occupational History  . Occupation: Curator  Tobacco Use  . Smoking status: Current Every Day Smoker    Packs/day: 1.00    Years: 42.00    Pack years: 42.00    Types: Cigarettes  . Smokeless tobacco: Never Used  . Tobacco comment: working on it  Substance and Sexual Activity  . Alcohol use: Yes    Alcohol/week: 12.0 - 20.0 standard drinks    Types: 12 - 20 Cans of beer per week  . Drug use: No  . Sexual activity: Not Currently  Other Topics Concern  . Not on file  Social History Narrative  . Not on file   Social Determinants of Health   Financial Resource Strain:   . Difficulty of Paying Living Expenses: Not on file  Food Insecurity:   . Worried About Programme researcher, broadcasting/film/video in the Last Year: Not on file  . Ran Out of Food in the Last Year: Not on file  Transportation Needs:   . Lack of Transportation (Medical): Not on file  . Lack of Transportation (Non-Medical): Not on file  Physical Activity:   . Days of Exercise per Week: Not on file  . Minutes of Exercise per Session: Not on file  Stress:   . Feeling of Stress : Not on file  Social Connections:   . Frequency of Communication with Friends and Family: Not on file  . Frequency of Social Gatherings with Friends  and Family: Not on file  . Attends Religious Services: Not on file  . Active Member of Clubs or Organizations: Not on file  . Attends Banker Meetings: Not on file  . Marital Status: Not on file  Intimate Partner Violence:   . Fear of Current or Ex-Partner: Not on file  . Emotionally Abused: Not on file  . Physically Abused: Not on file  . Sexually Abused: Not on file    Allergies  Allergen Reactions  . Penicillins Other (See Comments)    Doesn't remember  Has patient had a PCN reaction causing immediate rash, facial/tongue/throat swelling, SOB or lightheadedness with hypotension: unknown Has patient had a PCN reaction causing severe rash involving mucus membranes or skin necrosis: unknown Has patient had a PCN reaction that required hospitalization unknown Has patient had a PCN reaction occurring within the last 10 years: unknown If all of the above answers are "NO", then may proceed with Cephalosporin use.     Current Outpatient Medications  Medication Sig Dispense Refill  . albuterol (PROAIR HFA) 108 (90 Base) MCG/ACT inhaler INHALE 2 PUFFS INTO THE LUNGS EVERY 6 (SIX) HOURS AS NEEDED FOR SHORTNESS OF BREATH. 8.5 Inhaler 0  . aspirin 81 MG tablet Take 81 mg by mouth daily.    . clindamycin (CLEOCIN) 300 MG capsule Take 1 capsule (300 mg total) by mouth 3 (three) times daily. 30 capsule 0  . folic acid (FOLVITE) 1 MG tablet TAKE 1 TABLET BY MOUTH EVERY DAY. APPT NEEDED FOR FURTHER REFILLS 90 tablet 1  . furosemide (LASIX) 40 MG tablet Take 1 tablet (40 mg total) by mouth 2 (two) times daily. 180 tablet 3  . ipratropium-albuterol (DUONEB) 0.5-2.5 (3) MG/3ML SOLN Take 3 mLs by nebulization every 4 (four) hours. 360 mL 1  . lisinopril (ZESTRIL) 10 MG tablet Take 1 tablet (10 mg total) by mouth daily. 90 tablet 3  . metoprolol tartrate (LOPRESSOR) 25 MG tablet Take 1 tablet (25 mg total) by mouth 2 (two) times daily. 180 tablet 3  . rosuvastatin (CRESTOR) 5 MG tablet  Take 5 mg every other day 45 tablet 3  . sulfamethoxazole-trimethoprim (BACTRIM DS) 800-160 MG tablet Take 1 tablet by mouth 2 (two) times daily. 20 tablet 0  . tiotropium (SPIRIVA HANDIHALER) 18 MCG inhalation capsule Place 1 capsule (18 mcg total) into inhaler and inhale daily. 30 capsule 12  . traMADol (ULTRAM) 50 MG tablet Take 1-2 tablets every 8 hours as needed for pain. 60 tablet 0   No current facility-administered medications for this visit.    REVIEW OF SYSTEMS:  [X]  denotes positive finding, [ ]  denotes negative finding  Cardiac  Comments:  Chest pain or chest pressure:    Shortness of breath upon exertion:    Short of breath when lying flat:    Irregular heart rhythm:        Vascular    Pain in calf, thigh, or hip brought on by ambulation:    Pain in feet at night that wakes you up from your sleep:  x Left foot   Blood clot in your veins:    Leg swelling:         Pulmonary    Oxygen at home:    Productive cough:     Wheezing:         Neurologic    Sudden weakness in arms or legs:     Sudden numbness in arms or legs:     Sudden onset of difficulty speaking or slurred speech:    Temporary loss of vision in one eye:     Problems with dizziness:         Gastrointestinal    Blood in stool:     Vomited blood:         Genitourinary    Burning when urinating:     Blood in urine:        Psychiatric    Major depression:         Hematologic    Bleeding problems:    Problems with blood clotting too easily:        Skin    Rashes or ulcers:        Constitutional    Fever or chills:      PHYSICAL EXAM: Vitals:   07/15/19 1140  BP: (!) 88/54  Pulse: 91  Resp: 16  Temp: (!) 96.4 F (35.8 C)  TempSrc: Temporal  SpO2: 98%  Weight: 126 lb (57.2 kg)  Height: 5\' 4"  (1.626 m)    GENERAL: The patient is a well-nourished male, in no acute distress. The vital signs are documented above. CARDIAC: There is a regular rate and rhythm.  VASCULAR:  Weak right  femoral pulse, no left femoral pulse No palpable pulses in either lower extremity. Left foot is cold, he is cold above the left ankle as well. Foot appears partially mottled and significant wounds in the left foot. Sloughing skin on left foot. Pictured below. PULMONARY: There is good air exchange bilaterally without wheezing or rales. ABDOMEN: Soft and non-tender with normal pitched bowel sounds.  MUSCULOSKELETAL: There are no major deformities or cyanosis. NEUROLOGIC: barely able to wiggle toes in left foot  SKIN: There are no ulcers or rashes noted. PSYCHIATRIC: The patient has a normal affect.      DATA:   ABIs in the left lower extremity are 0 with no identifiable flow in the anterior tibial, posterior tibial, peroneal, popliteal, or SFA and no toe pressure.  Assessment/Plan:  74 year old male with a multitude of comorbidities including systolic CHF/ischemic cardiomyopathy with severely reduced EF of 25% that presents with chronically ischemic left lower extremity. Ultimately there is no documented flow in his left anterior tibial, posterior tibial, peroneal artery, popliteal, or SFA today.  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 His left foot is ice cold and he is also cold up above the ankle onto the shin. I have discussed with him and his daughter that I think he would need a primary above-knee amputation and the foot is likely not salvageable at this time. Discussed would not heal a below knee amputation  and would need above knee amputation.  Some risk even an above knee amputation would not heal given common femoral disease.  Patient ultimately states he is very attached to his leg and does not want an amputation at this time. I discussed risks of infection and/or ongoing severe rest pain. He states he'll discuss with his daughter will call back to schedule if he changes his mind.  This has been ongoing since last year in review of the records and I am unclear  of the exact timeline as to when his foot progressed to its current appearance but certainly would be very difficult to have any success at limb salvage at this time.    Marty Heck, MD [facility] Office: [phone]

## 2019-07-16 ENCOUNTER — Telehealth: Payer: Self-pay | Admitting: Family Medicine

## 2019-07-16 NOTE — Telephone Encounter (Signed)
[phone] Patient is calling to say that it was recommended that had leg amputated above the knee, tonya called regarding this, and she says that he is defiantly not going to do this, would like to know if there is anyway he can get pain medication and something to help him sleep

## 2019-07-18 ENCOUNTER — Other Ambulatory Visit: Payer: Self-pay | Admitting: Family Medicine

## 2019-07-18 MED ORDER — HYDROCODONE-ACETAMINOPHEN 5-325 MG PO TABS: 1.0000 | ORAL_TABLET | Freq: Four times a day (QID) | ORAL | 0 refills | Status: DC | PRN

## 2019-07-18 NOTE — Telephone Encounter (Signed)
I am willing to give pain medication to be used sparingly.  But we do not want to simply mask the pain and allow gangrene to spread because he can die.  I will give him 30 pills.  I would not mix with sleeping pills.

## 2019-07-18 NOTE — Telephone Encounter (Signed)
Pt aware.

## 2019-07-21 ENCOUNTER — Emergency Department (HOSPITAL_COMMUNITY): Payer: Medicare HMO

## 2019-07-21 ENCOUNTER — Encounter (HOSPITAL_COMMUNITY): Payer: Self-pay

## 2019-07-21 ENCOUNTER — Inpatient Hospital Stay (HOSPITAL_COMMUNITY)
Admission: EM | Admit: 2019-07-21 | Discharge: 2019-07-23 | DRG: 951 | Disposition: A | Discharge status: Hospice | Payer: Medicare HMO | Attending: Internal Medicine | Admitting: Internal Medicine

## 2019-07-21 DIAGNOSIS — N179 Acute kidney failure, unspecified: Secondary | ICD-10-CM | POA: Diagnosis not present

## 2019-07-21 DIAGNOSIS — F17210 Nicotine dependence, cigarettes, uncomplicated: Secondary | ICD-10-CM | POA: Diagnosis present

## 2019-07-21 DIAGNOSIS — R4182 Altered mental status, unspecified: Secondary | ICD-10-CM | POA: Diagnosis present

## 2019-07-21 DIAGNOSIS — Z7189 Other specified counseling: Secondary | ICD-10-CM | POA: Diagnosis not present

## 2019-07-21 DIAGNOSIS — R0689 Other abnormalities of breathing: Secondary | ICD-10-CM | POA: Diagnosis not present

## 2019-07-21 DIAGNOSIS — I5042 Chronic combined systolic (congestive) and diastolic (congestive) heart failure: Secondary | ICD-10-CM | POA: Diagnosis not present

## 2019-07-21 DIAGNOSIS — I252 Old myocardial infarction: Secondary | ICD-10-CM

## 2019-07-21 DIAGNOSIS — Z79899 Other long term (current) drug therapy: Secondary | ICD-10-CM

## 2019-07-21 DIAGNOSIS — G9341 Metabolic encephalopathy: Secondary | ICD-10-CM | POA: Diagnosis present

## 2019-07-21 DIAGNOSIS — Z951 Presence of aortocoronary bypass graft: Secondary | ICD-10-CM | POA: Diagnosis not present

## 2019-07-21 DIAGNOSIS — D638 Anemia in other chronic diseases classified elsewhere: Secondary | ICD-10-CM | POA: Diagnosis present

## 2019-07-21 DIAGNOSIS — I130 Hypertensive heart and chronic kidney disease with heart failure and stage 1 through stage 4 chronic kidney disease, or unspecified chronic kidney disease: Secondary | ICD-10-CM | POA: Diagnosis not present

## 2019-07-21 DIAGNOSIS — R6520 Severe sepsis without septic shock: Secondary | ICD-10-CM | POA: Diagnosis not present

## 2019-07-21 DIAGNOSIS — J9601 Acute respiratory failure with hypoxia: Secondary | ICD-10-CM | POA: Diagnosis not present

## 2019-07-21 DIAGNOSIS — R509 Fever, unspecified: Secondary | ICD-10-CM | POA: Diagnosis not present

## 2019-07-21 DIAGNOSIS — I2510 Atherosclerotic heart disease of native coronary artery without angina pectoris: Secondary | ICD-10-CM | POA: Diagnosis present

## 2019-07-21 DIAGNOSIS — I739 Peripheral vascular disease, unspecified: Secondary | ICD-10-CM | POA: Diagnosis not present

## 2019-07-21 DIAGNOSIS — N183 Chronic kidney disease, stage 3 unspecified: Secondary | ICD-10-CM | POA: Diagnosis present

## 2019-07-21 DIAGNOSIS — Z7982 Long term (current) use of aspirin: Secondary | ICD-10-CM

## 2019-07-21 DIAGNOSIS — G9340 Encephalopathy, unspecified: Secondary | ICD-10-CM | POA: Diagnosis present

## 2019-07-21 DIAGNOSIS — I998 Other disorder of circulatory system: Secondary | ICD-10-CM | POA: Diagnosis present

## 2019-07-21 DIAGNOSIS — Z515 Encounter for palliative care: Secondary | ICD-10-CM | POA: Diagnosis not present

## 2019-07-21 DIAGNOSIS — Z880 Allergy status to penicillin: Secondary | ICD-10-CM

## 2019-07-21 DIAGNOSIS — Z66 Do not resuscitate: Secondary | ICD-10-CM | POA: Diagnosis not present

## 2019-07-21 DIAGNOSIS — A419 Sepsis, unspecified organism: Secondary | ICD-10-CM | POA: Diagnosis not present

## 2019-07-21 DIAGNOSIS — Z20822 Contact with and (suspected) exposure to covid-19: Secondary | ICD-10-CM | POA: Diagnosis present

## 2019-07-21 DIAGNOSIS — R0902 Hypoxemia: Secondary | ICD-10-CM | POA: Diagnosis not present

## 2019-07-21 DIAGNOSIS — J439 Emphysema, unspecified: Secondary | ICD-10-CM | POA: Diagnosis not present

## 2019-07-21 DIAGNOSIS — R404 Transient alteration of awareness: Secondary | ICD-10-CM | POA: Diagnosis not present

## 2019-07-21 DIAGNOSIS — L089 Local infection of the skin and subcutaneous tissue, unspecified: Secondary | ICD-10-CM | POA: Diagnosis not present

## 2019-07-21 DIAGNOSIS — I255 Ischemic cardiomyopathy: Secondary | ICD-10-CM | POA: Diagnosis present

## 2019-07-21 DIAGNOSIS — R000 Tachycardia, unspecified: Secondary | ICD-10-CM | POA: Diagnosis not present

## 2019-07-21 DIAGNOSIS — J449 Chronic obstructive pulmonary disease, unspecified: Secondary | ICD-10-CM | POA: Diagnosis present

## 2019-07-21 DIAGNOSIS — I959 Hypotension, unspecified: Secondary | ICD-10-CM | POA: Diagnosis not present

## 2019-07-21 LAB — COMPREHENSIVE METABOLIC PANEL
ALT: 88 U/L — ABNORMAL HIGH (ref 0–44)
AST: 169 U/L — ABNORMAL HIGH (ref 15–41)
Albumin: 3.2 g/dL — ABNORMAL LOW (ref 3.5–5.0)
Alkaline Phosphatase: 294 U/L — ABNORMAL HIGH (ref 38–126)
Anion gap: 16 — ABNORMAL HIGH (ref 5–15)
BUN: 139 mg/dL — ABNORMAL HIGH (ref 8–23)
CO2: 14 mmol/L — ABNORMAL LOW (ref 22–32)
Calcium: 9.3 mg/dL (ref 8.9–10.3)
Chloride: 118 mmol/L — ABNORMAL HIGH (ref 98–111)
Creatinine, Ser: 3.4 mg/dL — ABNORMAL HIGH (ref 0.61–1.24)
GFR calc Af Amer: 19 mL/min — ABNORMAL LOW (ref >60)
GFR calc non Af Amer: 17 mL/min — ABNORMAL LOW (ref >60)
Glucose, Bld: 136 mg/dL — ABNORMAL HIGH (ref 70–99)
Potassium: 5.3 mmol/L — ABNORMAL HIGH (ref 3.5–5.1)
Sodium: 148 mmol/L — ABNORMAL HIGH (ref 135–145)
Total Bilirubin: 0.4 mg/dL (ref 0.3–1.2)
Total Protein: 8 g/dL (ref 6.5–8.1)

## 2019-07-21 LAB — POCT I-STAT 7, (LYTES, BLD GAS, ICA,H+H)
Acid-base deficit: 14 mmol/L — ABNORMAL HIGH (ref 0.0–2.0)
Bicarbonate: 10.4 mmol/L — ABNORMAL LOW (ref 20.0–28.0)
Calcium, Ion: 1.23 mmol/L (ref 1.15–1.40)
HCT: 33 % — ABNORMAL LOW (ref 39.0–52.0)
Hemoglobin: 11.2 g/dL — ABNORMAL LOW (ref 13.0–17.0)
O2 Saturation: 93 %
Patient temperature: 98.7
Potassium: 5 mmol/L (ref 3.5–5.1)
Sodium: 148 mmol/L — ABNORMAL HIGH (ref 135–145)
TCO2: 11 mmol/L — ABNORMAL LOW (ref 22–32)
pCO2 arterial: 19.9 mmHg — CL (ref 32.0–48.0)
pH, Arterial: 7.327 — ABNORMAL LOW (ref 7.350–7.450)
pO2, Arterial: 68 mmHg — ABNORMAL LOW (ref 83.0–108.0)

## 2019-07-21 LAB — PROTIME-INR
INR: 1 (ref 0.8–1.2)
Prothrombin Time: 13.4 seconds (ref 11.4–15.2)

## 2019-07-21 LAB — CBC WITH DIFFERENTIAL/PLATELET
Abs Immature Granulocytes: 0.11 10*3/uL — ABNORMAL HIGH (ref 0.00–0.07)
Basophils Absolute: 0.1 10*3/uL (ref 0.0–0.1)
Basophils Relative: 0 %
Eosinophils Absolute: 0 10*3/uL (ref 0.0–0.5)
Eosinophils Relative: 0 %
HCT: 39.8 % (ref 39.0–52.0)
Hemoglobin: 12.9 g/dL — ABNORMAL LOW (ref 13.0–17.0)
Immature Granulocytes: 1 %
Lymphocytes Relative: 7 %
Lymphs Abs: 1.3 10*3/uL (ref 0.7–4.0)
MCH: 30.4 pg (ref 26.0–34.0)
MCHC: 32.4 g/dL (ref 30.0–36.0)
MCV: 93.9 fL (ref 80.0–100.0)
Monocytes Absolute: 1.3 10*3/uL — ABNORMAL HIGH (ref 0.1–1.0)
Monocytes Relative: 7 %
Neutro Abs: 15.2 10*3/uL — ABNORMAL HIGH (ref 1.7–7.7)
Neutrophils Relative %: 85 %
Platelets: 479 10*3/uL — ABNORMAL HIGH (ref 150–400)
RBC: 4.24 MIL/uL (ref 4.22–5.81)
RDW: 15.4 % (ref 11.5–15.5)
WBC: 18 10*3/uL — ABNORMAL HIGH (ref 4.0–10.5)
nRBC: 0 % (ref 0.0–0.2)

## 2019-07-21 LAB — LACTIC ACID, PLASMA: Lactic Acid, Venous: 2.1 mmol/L (ref 0.5–1.9)

## 2019-07-21 LAB — AMMONIA: Ammonia: 34 umol/L (ref 9–35)

## 2019-07-21 LAB — ETHANOL: Alcohol, Ethyl (B): <10 mg/dL (ref <10)

## 2019-07-21 MED ORDER — SODIUM CHLORIDE 0.9 % IV SOLN
2.0000 g | Freq: Once | INTRAVENOUS | Status: AC
  Administered 2019-07-21: 2 g via INTRAVENOUS
  Filled 2019-07-21: qty 2

## 2019-07-21 MED ORDER — LACTATED RINGERS IV BOLUS (SEPSIS)
500.0000 mL | Freq: Once | INTRAVENOUS | Status: AC
  Administered 2019-07-21: 500 mL via INTRAVENOUS

## 2019-07-21 MED ORDER — LACTATED RINGERS IV BOLUS (SEPSIS)
250.0000 mL | Freq: Once | INTRAVENOUS | Status: AC
  Administered 2019-07-21: 250 mL via INTRAVENOUS

## 2019-07-21 MED ORDER — MORPHINE SULFATE (PF) 4 MG/ML IV SOLN
4.0000 mg | Freq: Once | INTRAVENOUS | Status: AC
  Administered 2019-07-22: 4 mg via INTRAVENOUS
  Filled 2019-07-21: qty 1

## 2019-07-21 MED ORDER — LEVOFLOXACIN IN D5W 750 MG/150ML IV SOLN: 750.0000 mg | Freq: Once | INTRAVENOUS | Status: DC

## 2019-07-21 MED ORDER — VANCOMYCIN HCL IN DEXTROSE 1-5 GM/200ML-% IV SOLN
1000.0000 mg | Freq: Once | INTRAVENOUS | Status: AC
  Administered 2019-07-21: 1000 mg via INTRAVENOUS
  Filled 2019-07-21: qty 200

## 2019-07-21 MED ORDER — LACTATED RINGERS IV BOLUS (SEPSIS)
1000.0000 mL | Freq: Once | INTRAVENOUS | Status: AC
  Administered 2019-07-21: 1000 mL via INTRAVENOUS

## 2019-07-21 NOTE — ED Notes (Signed)
Aldean Ast [place] [phone] looking for an update

## 2019-07-21 NOTE — ED Triage Notes (Addendum)
Patient with infection to left foot.  Seen by wound care specialist who told him it needed amputated.  Patient refused.  Family called EMS due to patient being altered.  Patient normally A&O but today repeating questions and not oriented to time

## 2019-07-21 NOTE — ED Provider Notes (Addendum)
[facility] EMERGENCY DEPARTMENT Provider Note   CSN: [id number] Arrival date & time: 07/21/19  2204     History Chief Complaint  Patient presents with  . Altered Mental Status    Cory Dennis is a 74 y.o. male.  HPI   Patient presented to the ED for evaluation of altered mental status.  According to EMS report the patient has history of significant peripheral vascular disease.  He also has history of COPD and ischemic cardiomyopathy.  Patient has been having difficulty with a left lower extremity cellulitis.  According to the medical records the patient continues to smoke and has had progressive infection and pain in his foot.  Patient has been barely able to move his toes.  He was seen in the vascular clinic on February 16.  There was no documented flow noted in his foot.  His foot was ice cold on exam at that time. an above-the-knee amputation was recommended.  In the ED the patient's altered and is unable to communicate with me.  Past Medical History:  Diagnosis Date  . Alcohol abuse   . Anemia   . Anxiety   . Arthritis   . CAD (coronary artery disease)    a. s/p CABG in 2011 with LIMA-LAD, RIMA-distal RCA, and sequential ERD-EY8/XK 2 complicated by nonunion of sternum)  . CHF (congestive heart failure) (HCC)    a. EF 25% by echo in 07/2014. Declined ICD placement.   . Chronic systolic CHF (congestive heart failure) ([place]) 11/06/2014  . COPD (chronic obstructive pulmonary disease) ([place])   . Depression   . Emphysema of lung ([place])   . Fatigue   . Hypertension   . Ischemic cardiomyopathy   . Musculoskeletal chest pain   . Myocardial infarction ([place])   . SOB (shortness of breath)   . Syncope   . Tobacco abuse     Patient Active Problem List   Diagnosis Date Noted  . Peripheral arterial disease ([place]) 11/01/2018  . Subdural hematoma ([place]) 05/05/2015  . Chronic combined systolic and diastolic heart failure ([place]) 11/06/2014  . COPD (chronic obstructive  pulmonary disease) ([place]) 09/16/2014  . Congestive heart disease ([place])   . Acute congestive heart failure ([place]) 08/19/2014  . CHF exacerbation ([place]) 08/19/2014  . Hyperkalemia 08/19/2014  . Alcohol abuse 08/19/2014  . Hypoxia   . Acute respiratory failure with hypoxia ([place])   . Anemia of chronic disease 08/07/2014  . S/P CABG (coronary artery bypass graft) 07/01/2012  . Hyponatremia 12/28/2011  . Myocardial infarction ([place])   . Musculoskeletal chest pain   . Ischemic cardiomyopathy   . CAD (coronary artery disease)   . Tobacco abuse   . COPD exacerbation [facility])     Past Surgical History:  Procedure Laterality Date  . CORONARY ARTERY BYPASS GRAFT  2011   4v CABG [facility] in [place] in 06/2009 with LIMA to LAD, RIMA to distal RCA, sequential SVG to OM1/OM 2  . CRANIOTOMY Left 05/05/2015   Procedure: Left CRANIOTOMY HEMATOMA EVACUATION SUBDURAL;  Surgeon: Eustace Moore, MD;  Location: [facility];  Service: Neurosurgery;  Laterality: Left;  . Left ventricular aneurysm repair         Family History  Family history unknown: Yes    Social History   Tobacco Use  . Smoking status: Current Every Day Smoker    Packs/day: 1.00    Years: 42.00    Pack years: 42.00    Types: Cigarettes  . Smokeless tobacco:  Never Used  . Tobacco comment: working on it  Substance Use Topics  . Alcohol use: Yes    Alcohol/week: 12.0 - 20.0 standard drinks    Types: 12 - 20 Cans of beer per week  . Drug use: No    Home Medications Prior to Admission medications   Medication Sig Start Date End Date Taking? Authorizing Provider  albuterol (PROAIR HFA) 108 (90 Base) MCG/ACT inhaler INHALE 2 PUFFS INTO THE LUNGS EVERY 6 (SIX) HOURS AS NEEDED FOR SHORTNESS OF BREATH. 04/18/18   Danelle Berry, PA-C  aspirin 81 MG tablet Take 81 mg by mouth daily.    [provider]  clindamycin (CLEOCIN) 300 MG capsule Take 1 capsule (300 mg total) by mouth 3 (three) times daily. 07/01/19   Donita Brooks,  MD  folic acid (FOLVITE) 1 MG tablet TAKE 1 TABLET BY MOUTH EVERY DAY. APPT NEEDED FOR FURTHER REFILLS 02/05/19   [place], Peter M, MD  furosemide (LASIX) 40 MG tablet Take 1 tablet (40 mg total) by mouth 2 (two) times daily. 12/16/18   [place], Peter M, MD  HYDROcodone-acetaminophen (NORCO) 5-325 MG tablet Take 1 tablet by mouth every 6 (six) hours as needed for moderate pain. 07/18/19   Donita Brooks, MD  ipratropium-albuterol (DUONEB) 0.5-2.5 (3) MG/3ML SOLN Take 3 mLs by nebulization every 4 (four) hours. 04/18/18   Danelle Berry, PA-C  lisinopril (ZESTRIL) 10 MG tablet Take 1 tablet (10 mg total) by mouth daily. 12/16/18   [place], Peter M, MD  metoprolol tartrate (LOPRESSOR) 25 MG tablet Take 1 tablet (25 mg total) by mouth 2 (two) times daily. 12/16/18   [place], Peter M, MD  rosuvastatin (CRESTOR) 5 MG tablet Take 5 mg every other day 12/16/18   [place], Peter M, MD  sulfamethoxazole-trimethoprim (BACTRIM DS) 800-160 MG tablet Take 1 tablet by mouth 2 (two) times daily. 07/01/19   Donita Brooks, MD  tiotropium (SPIRIVA HANDIHALER) 18 MCG inhalation capsule Place 1 capsule (18 mcg total) into inhaler and inhale daily. 04/18/18   Danelle Berry, PA-C    Allergies    Penicillins  Review of Systems   Review of Systems  All other systems reviewed and are negative.   Physical Exam Updated Vital Signs BP (!) 130/118   Pulse (!) 101   Temp 98.7 F (37.1 C) (Oral)   Resp (!) 21   Ht 1.626 m (5\' 4" )   Wt 57 kg   SpO2 90%   BMI 21.57 kg/m   Physical Exam Vitals and nursing note reviewed.  Constitutional:      General: He is in acute distress.     Appearance: He is well-developed. He is ill-appearing.  HENT:     Head: Normocephalic and atraumatic.     Right Ear: External ear normal.     Left Ear: External ear normal.  Eyes:     General: No scleral icterus.       Right eye: No discharge.        Left eye: No discharge.     Conjunctiva/sclera: Conjunctivae normal.  Neck:     Trachea:  No tracheal deviation.  Cardiovascular:     Rate and Rhythm: Regular rhythm. Tachycardia present.  Pulmonary:     Effort: Pulmonary effort is normal. No respiratory distress.     Breath sounds: No stridor. Wheezing present. No rales.  Abdominal:     General: Bowel sounds are normal. There is no distension.     Palpations: Abdomen is soft.  Tenderness: There is no abdominal tenderness. There is no guarding or rebound.  Musculoskeletal:        General: No tenderness.     Cervical back: Neck supple.     Comments: Left foot is cold, pulse not palpable, foot dusky, ulceration and edema noted  Skin:    General: Skin is warm and dry.     Findings: No rash.  Neurological:     GCS: GCS eye subscore is 4. GCS verbal subscore is 2. GCS motor subscore is 5.     Motor: No abnormal muscle tone or seizure activity.     Coordination: Coordination normal.     Comments: Patient does not respond to verbal stimuli, he is unresponsive, is moving extremities     ED Results / Procedures / Treatments   Labs (all labs ordered are listed, but only abnormal results are displayed) Labs Reviewed  CBC WITH DIFFERENTIAL/PLATELET - Abnormal; Notable for the following components:      Result Value   WBC 18.0 (*)    Hemoglobin 12.9 (*)    Platelets 479 (*)    Neutro Abs 15.2 (*)    Monocytes Absolute 1.3 (*)    Abs Immature Granulocytes 0.11 (*)    All other components within normal limits  POCT I-STAT 7, (LYTES, BLD GAS, ICA,H+H) - Abnormal; Notable for the following components:   pH, Arterial 7.327 (*)    pCO2 arterial 19.9 (*)    pO2, Arterial 68.0 (*)    Bicarbonate 10.4 (*)    TCO2 11 (*)    Acid-base deficit 14.0 (*)    Sodium 148 (*)    HCT 33.0 (*)    Hemoglobin 11.2 (*)    All other components within normal limits  CULTURE, BLOOD (ROUTINE X 2)  CULTURE, BLOOD (ROUTINE X 2)  URINE CULTURE  PROTIME-INR  COMPREHENSIVE METABOLIC PANEL  LACTIC ACID, PLASMA  LACTIC ACID, PLASMA    URINALYSIS, ROUTINE W REFLEX MICROSCOPIC  APTT  ETHANOL  AMMONIA  POC SARS CORONAVIRUS 2 AG -  ED    EKG None  Radiology DG Chest Port 1 View  Result Date: 07/21/2019 CLINICAL DATA:  Fever EXAM: PORTABLE CHEST 1 VIEW COMPARISON:  07/25/2016 FINDINGS: Post sternotomy changes. Fractured upper sternal wires. Mild cardiomegaly. No consolidation or pleural effusion. Aortic atherosclerosis. No pneumothorax. Mild scarring at the right lung base. IMPRESSION: No active disease. Electronically Signed   By: Jasmine Pang M.D.   On: 07/21/2019 22:59   DG Foot 2 Views Left  Result Date: 07/21/2019 CLINICAL DATA:  Left foot infection EXAM: LEFT FOOT - 2 VIEW COMPARISON:  07/01/2019 FINDINGS: No fracture or malalignment. No periostitis or bone destruction. No soft tissue emphysema IMPRESSION: No acute osseous abnormality Electronically Signed   By: Jasmine Pang M.D.   On: 07/21/2019 22:58    Procedures .Critical Care Performed by: Linwood Dibbles, MD Authorized by: Linwood Dibbles, MD   Critical care provider statement:    Critical care time (minutes):  45   Critical care was time spent personally by me on the following activities:  Discussions with consultants, evaluation of patient's response to treatment, examination of patient, ordering and performing treatments and interventions, ordering and review of laboratory studies, ordering and review of radiographic studies, pulse oximetry, re-evaluation of patient's condition, obtaining history from patient or surrogate and review of old charts   (including critical care time)  Medications Ordered in ED Medications  vancomycin (VANCOCIN) IVPB 1000 mg/200 mL premix (1,000 mg Intravenous New  Bag/Given 07/21/19 2312)  morphine 4 MG/ML injection 4 mg (has no administration in time range)  lactated ringers bolus 1,000 mL (1,000 mLs Intravenous New Bag/Given 07/21/19 2309)    And  lactated ringers bolus 500 mL (0 mLs Intravenous Stopped 07/21/19 2327)    And   lactated ringers bolus 250 mL (250 mLs Intravenous New Bag/Given 07/21/19 2330)  ceFEPIme (MAXIPIME) 2 g in sodium chloride 0.9 % 100 mL IVPB (2 g Intravenous New Bag/Given 07/21/19 2311)    ED Course  I have reviewed the triage vital signs and the nursing notes.  Pertinent labs & imaging results that were available during my care of the patient were reviewed by me and considered in my medical decision making (see chart for details).  Clinical Course as of Jul 20 2341  Mon Jul 21, 2019  2321 Labs are notable for severely elevated white blood cell count.  ABG shows severely decreased PCO2.  Likely indicative of severe metabolic acidosis with respiratory compensation.   [JK]  2327 Discussed with Aldean Ast, power of attorney.  Pt is DNR.  He would not want his leg amputated even if it would mean his death.  Plan is for comfort care.  Pt wanted to die at home but family could not manage at home.   [JK]  2341 Patient still confused but does now speak to me.  Appears slightly improved   [JK]    Clinical Course User Index [JK] Linwood Dibbles, MD   MDM Rules/Calculators/A&P                      Patient presented to the ED for evaluation of altered mental status.  Patient has history of multiple medical problems.  We discussed goals of care with the family and they indicate that the patient would not want to have an amputation at any time.  Even if this meant he would die.  They would like to have him stay comfortable.  Will transition to comfort care. Continue abx fluids.  No pressors,  No intubation.  No surgical intervention.  Final Clinical Impression(s) / ED Diagnoses Final diagnoses:  Peripheral vascular disease (HCC)      Linwood Dibbles, MD 07/21/19 2344    Linwood Dibbles, MD 07/22/19 0000

## 2019-07-22 ENCOUNTER — Encounter (HOSPITAL_COMMUNITY): Payer: Self-pay | Admitting: Internal Medicine

## 2019-07-22 ENCOUNTER — Other Ambulatory Visit: Payer: Self-pay

## 2019-07-22 DIAGNOSIS — Z20822 Contact with and (suspected) exposure to covid-19: Secondary | ICD-10-CM | POA: Diagnosis present

## 2019-07-22 DIAGNOSIS — Z7189 Other specified counseling: Secondary | ICD-10-CM | POA: Diagnosis not present

## 2019-07-22 DIAGNOSIS — Z7982 Long term (current) use of aspirin: Secondary | ICD-10-CM | POA: Diagnosis not present

## 2019-07-22 DIAGNOSIS — J9601 Acute respiratory failure with hypoxia: Secondary | ICD-10-CM | POA: Diagnosis present

## 2019-07-22 DIAGNOSIS — Z515 Encounter for palliative care: Secondary | ICD-10-CM

## 2019-07-22 DIAGNOSIS — R4182 Altered mental status, unspecified: Secondary | ICD-10-CM | POA: Diagnosis present

## 2019-07-22 DIAGNOSIS — G9340 Encephalopathy, unspecified: Secondary | ICD-10-CM | POA: Diagnosis present

## 2019-07-22 DIAGNOSIS — Z951 Presence of aortocoronary bypass graft: Secondary | ICD-10-CM | POA: Diagnosis not present

## 2019-07-22 DIAGNOSIS — I2510 Atherosclerotic heart disease of native coronary artery without angina pectoris: Secondary | ICD-10-CM | POA: Diagnosis present

## 2019-07-22 DIAGNOSIS — R6520 Severe sepsis without septic shock: Secondary | ICD-10-CM | POA: Diagnosis present

## 2019-07-22 DIAGNOSIS — N179 Acute kidney failure, unspecified: Secondary | ICD-10-CM | POA: Diagnosis present

## 2019-07-22 DIAGNOSIS — I255 Ischemic cardiomyopathy: Secondary | ICD-10-CM | POA: Diagnosis present

## 2019-07-22 DIAGNOSIS — I5042 Chronic combined systolic (congestive) and diastolic (congestive) heart failure: Secondary | ICD-10-CM | POA: Diagnosis present

## 2019-07-22 DIAGNOSIS — N183 Chronic kidney disease, stage 3 unspecified: Secondary | ICD-10-CM | POA: Diagnosis present

## 2019-07-22 DIAGNOSIS — Z79899 Other long term (current) drug therapy: Secondary | ICD-10-CM | POA: Diagnosis not present

## 2019-07-22 DIAGNOSIS — G9341 Metabolic encephalopathy: Secondary | ICD-10-CM | POA: Diagnosis present

## 2019-07-22 DIAGNOSIS — A419 Sepsis, unspecified organism: Secondary | ICD-10-CM | POA: Diagnosis present

## 2019-07-22 DIAGNOSIS — I252 Old myocardial infarction: Secondary | ICD-10-CM | POA: Diagnosis not present

## 2019-07-22 DIAGNOSIS — J439 Emphysema, unspecified: Secondary | ICD-10-CM | POA: Diagnosis present

## 2019-07-22 DIAGNOSIS — Z66 Do not resuscitate: Secondary | ICD-10-CM | POA: Diagnosis present

## 2019-07-22 DIAGNOSIS — I130 Hypertensive heart and chronic kidney disease with heart failure and stage 1 through stage 4 chronic kidney disease, or unspecified chronic kidney disease: Secondary | ICD-10-CM | POA: Diagnosis present

## 2019-07-22 DIAGNOSIS — I998 Other disorder of circulatory system: Secondary | ICD-10-CM | POA: Diagnosis present

## 2019-07-22 DIAGNOSIS — F17210 Nicotine dependence, cigarettes, uncomplicated: Secondary | ICD-10-CM | POA: Diagnosis present

## 2019-07-22 DIAGNOSIS — Z880 Allergy status to penicillin: Secondary | ICD-10-CM | POA: Diagnosis not present

## 2019-07-22 DIAGNOSIS — I739 Peripheral vascular disease, unspecified: Secondary | ICD-10-CM | POA: Diagnosis present

## 2019-07-22 LAB — URINALYSIS, ROUTINE W REFLEX MICROSCOPIC
Bacteria, UA: NONE SEEN
Bilirubin Urine: NEGATIVE
Glucose, UA: NEGATIVE mg/dL
Ketones, ur: NEGATIVE mg/dL
Nitrite: NEGATIVE
Protein, ur: 30 mg/dL — AB
Specific Gravity, Urine: 1.015 (ref 1.005–1.030)
pH: 8 (ref 5.0–8.0)

## 2019-07-22 LAB — URINE CULTURE

## 2019-07-22 LAB — POC SARS CORONAVIRUS 2 AG -  ED: SARS Coronavirus 2 Ag: NEGATIVE

## 2019-07-22 LAB — SURGICAL PCR SCREEN
MRSA, PCR: NEGATIVE
Staphylococcus aureus: NEGATIVE

## 2019-07-22 LAB — SARS CORONAVIRUS 2 (TAT 6-24 HRS): SARS Coronavirus 2: NEGATIVE

## 2019-07-22 MED ORDER — MORPHINE SULFATE (PF) 2 MG/ML IV SOLN: 1.0000 mg | INTRAVENOUS | Status: DC | PRN

## 2019-07-22 MED ORDER — ACETAMINOPHEN 650 MG RE SUPP: 650.0000 mg | Freq: Four times a day (QID) | RECTAL | Status: DC | PRN

## 2019-07-22 MED ORDER — GLYCOPYRROLATE 0.2 MG/ML IJ SOLN
0.2000 mg | INTRAMUSCULAR | Status: DC | PRN
  Filled 2019-07-22: qty 1

## 2019-07-22 MED ORDER — ACETAMINOPHEN 325 MG PO TABS: 650.0000 mg | ORAL_TABLET | Freq: Four times a day (QID) | ORAL | Status: DC | PRN

## 2019-07-22 MED ORDER — HALOPERIDOL LACTATE 2 MG/ML PO CONC
0.5000 mg | ORAL | Status: DC | PRN
  Filled 2019-07-22: qty 0.3

## 2019-07-22 MED ORDER — HALOPERIDOL 0.5 MG PO TABS
0.5000 mg | ORAL_TABLET | ORAL | Status: DC | PRN
  Filled 2019-07-22: qty 1

## 2019-07-22 MED ORDER — ONDANSETRON HCL 4 MG/2ML IJ SOLN: 4.0000 mg | Freq: Four times a day (QID) | INTRAMUSCULAR | Status: DC | PRN

## 2019-07-22 MED ORDER — HALOPERIDOL LACTATE 5 MG/ML IJ SOLN: 0.5000 mg | INTRAMUSCULAR | Status: DC | PRN

## 2019-07-22 MED ORDER — BIOTENE DRY MOUTH MT LIQD: 15.0000 mL | OROMUCOSAL | Status: DC | PRN

## 2019-07-22 MED ORDER — LORAZEPAM 2 MG/ML IJ SOLN
1.0000 mg | INTRAMUSCULAR | Status: DC | PRN
  Administered 2019-07-22: 1 mg via INTRAVENOUS
  Filled 2019-07-22: qty 1

## 2019-07-22 MED ORDER — POLYVINYL ALCOHOL 1.4 % OP SOLN
1.0000 [drp] | Freq: Four times a day (QID) | OPHTHALMIC | Status: DC | PRN
  Filled 2019-07-22: qty 15

## 2019-07-22 MED ORDER — HEPARIN SODIUM (PORCINE) 5000 UNIT/ML IJ SOLN
5000.0000 [IU] | Freq: Three times a day (TID) | INTRAMUSCULAR | Status: DC
  Administered 2019-07-22: 5000 [IU] via SUBCUTANEOUS
  Filled 2019-07-22: qty 1

## 2019-07-22 MED ORDER — ALBUTEROL SULFATE (2.5 MG/3ML) 0.083% IN NEBU
2.5000 mg | INHALATION_SOLUTION | RESPIRATORY_TRACT | Status: DC | PRN
  Administered 2019-07-22: 2.5 mg via RESPIRATORY_TRACT
  Filled 2019-07-22: qty 3

## 2019-07-22 MED ORDER — MORPHINE SULFATE (PF) 2 MG/ML IV SOLN
1.0000 mg | INTRAVENOUS | Status: DC | PRN
  Administered 2019-07-22 (×2): 2 mg via INTRAVENOUS
  Filled 2019-07-22 (×2): qty 1

## 2019-07-22 MED ORDER — ONDANSETRON HCL 4 MG PO TABS: 4.0000 mg | ORAL_TABLET | Freq: Four times a day (QID) | ORAL | Status: DC | PRN

## 2019-07-22 MED ORDER — GLYCOPYRROLATE 1 MG PO TABS
1.0000 mg | ORAL_TABLET | ORAL | Status: DC | PRN
  Filled 2019-07-22: qty 1

## 2019-07-22 MED ORDER — ONDANSETRON 4 MG PO TBDP: 4.0000 mg | ORAL_TABLET | Freq: Four times a day (QID) | ORAL | Status: DC | PRN

## 2019-07-22 NOTE — Progress Notes (Signed)
The patient has been taking his clothes off multiple times and removing his oxygen multiple times.  Medication for anxiety has been given as ordered

## 2019-07-22 NOTE — TOC Initial Note (Signed)
Transition of Care [facility]) - Initial/Assessment Note    Patient Details  Name: Cory Dennis MRN: [id number] Date of Birth: 1945-07-14  Transition of Care [facility]) CM/SW Contact:    Bess Kinds, RN Phone Number: [phone] 07/22/2019, 5:09 PM  Clinical Narrative:                 Notified by palliative NP of decision and choice for inpatient hospice at [facility]. Referral placed. Spoke with HCPOA - Cory who expressed appreciation for call. TOC following for transition needs.   Expected Discharge Plan: Hospice Medical Facility     Patient Goals and CMS Choice   CMS Medicare.gov Compare Post Acute Care list provided to:: Patient Represenative (must comment)(Cory Dennis [phone]) Choice offered to / list presented to : [facility] POA / Guardian  Expected Discharge Plan and Services Expected Discharge Plan: Hospice Medical Facility In-house Referral: Hospice / Palliative Care Discharge Planning Services: CM Consult Post Acute Care Choice: Hospice Living arrangements for the past 2 months: Single Family Home                 DME Arranged: N/A DME Agency: NA       HH Arranged: NA HH Agency: NA        Prior Living Arrangements/Services Living arrangements for the past 2 months: Single Family Home Lives with:: Self                   Activities of Daily Living Home Assistive Devices/Equipment: None ADL Screening (condition at time of admission) Patient's cognitive ability adequate to safely complete daily activities?: No Is the patient deaf or have difficulty hearing?: No Does the patient have difficulty seeing, even when wearing glasses/contacts?: No Does the patient have difficulty concentrating, remembering, or making decisions?: Yes Patient able to express need for assistance with ADLs?: No Does the patient have difficulty dressing or bathing?: Yes Independently performs ADLs?: No Does the patient have difficulty walking or climbing stairs?: Yes Weakness  of Legs: Both Weakness of Arms/Hands: None  Permission Sought/Granted                  Emotional Assessment              Admission diagnosis:  Peripheral vascular disease (HCC) [I73.9] AKI (acute kidney injury) (HCC) [N17.9] Ischemic foot [I99.8] Sepsis with acute renal failure without septic shock, due to unspecified organism, unspecified acute renal failure type (HCC) [A41.9, R65.20, N17.9] Patient Active Problem List   Diagnosis Date Noted  . Ischemic foot 07/22/2019  . DNR (do not resuscitate) 07/22/2019  . AKI (acute kidney injury) (HCC) 07/22/2019  . Acute encephalopathy 07/22/2019  . Sepsis with acute renal failure without septic shock (HCC)   . Goals of care, counseling/discussion   . Palliative care by specialist   . Encounter for hospice care discussion   . Peripheral arterial disease (HCC) 11/01/2018  . Subdural hematoma (HCC) 05/05/2015  . Chronic combined systolic and diastolic heart failure (HCC) 11/06/2014  . COPD (chronic obstructive pulmonary disease) (HCC) 09/16/2014  . Congestive heart disease (HCC)   . Acute congestive heart failure (HCC) 08/19/2014  . CHF exacerbation (HCC) 08/19/2014  . Hyperkalemia 08/19/2014  . Alcohol abuse 08/19/2014  . Hypoxia   . Acute respiratory failure with hypoxia (HCC)   . Anemia of chronic disease 08/07/2014  . S/P CABG (coronary artery bypass graft) 07/01/2012  . Hyponatremia 12/28/2011  . Myocardial infarction (HCC)   . Musculoskeletal chest pain   .  Ischemic cardiomyopathy   . CAD (coronary artery disease)   . Tobacco abuse   . COPD exacerbation ([place])    PCP:  Susy Frizzle, MD Pharmacy:   [id number] - [place], [address] [place] [place] [address] Phone: [phone] Fax: [phone]     Social Determinants of Health (SDOH) Interventions    Readmission Risk Interventions No flowsheet data found.

## 2019-07-22 NOTE — Progress Notes (Signed)
Manufacturing systems engineer [facility]) Hospital Liaison note.    Received request from [facility] manager Toniann Fail for family interest in [facility]. [facility] is unable to offer a room today. Hospital Liaison will follow up tomorrow or sooner if a room becomes available.    A Please do not hesitate to call with questions.    Thank you,   Elsie Saas, RN, CCM      [facility] Liaison (listed on AMION under [facility] [place])    [phone]

## 2019-07-22 NOTE — Progress Notes (Signed)
The patient has been quiet.  Restless at times and will take his clothes off and his oxygen.  The patient has been medicated for discomfort as ordered.

## 2019-07-22 NOTE — Progress Notes (Signed)
During assessment , nurse noted patient wheezing and congested in his lungs, attending physician paged, patient on 7 on 2L Nasal cannula, will continue to monitor patient.

## 2019-07-22 NOTE — H&P (Signed)
History and Physical    [name]:[id number] DOB: [birth year] DOA: 07/21/2019  PCP: Donita Brooks, MD  Patient coming from: Home.  Chief Complaint: Altered mental status worsening left foot wound.  HPI: Cory Dennis is a 74 y.o. male with history of CAD status post CABG, ischemic cardiomyopathy EF 25%, chronic kidney disease baseline creatinine around 1.5, hypertension COPD ongoing tobacco abuse was brought to the ER patient became increasingly confused and short of breath with worsening wound in the left lower extremity.  Patient has chronic wound on the left lower extremity recently evaluated by vascular surgeon last week and was advised to have amputation but patient declined.  Since then patient's condition has been further declining and became altered and confused at this point was brought to the ER.  ED Course: In the ER on exam patient has left gangrenous foot with no pulses detectable.  Labs show worsening renal function from 1.5 it is presently around 3.4 potassium 5.3 bicarb 14 sodium 148 potassium 5.3 AST 169 ALT 88 CBC shows WBC of 18,000 hemoglobin 12.9 platelets 479.  X-rays do not show anything acute.  EKG shows sinus tachycardia.  ER physician and I also discussed with patient's power of attorney and at this time after extensive discussion it is decided patient is to be made comfort measures only and patient has previously made it clear in his foot not to be amputated.  Patient admitted for only comfort measures no further labs will be ordered.  Review of Systems: As per HPI, rest all negative.   Past Medical History:  Diagnosis Date  . Alcohol abuse   . Anemia   . Anxiety   . Arthritis   . CAD (coronary artery disease)    a. s/p CABG in 2011 with LIMA-LAD, RIMA-distal RCA, and sequential SVG-OM1/OM 2 complicated by nonunion of sternum)  . CHF (congestive heart failure) (HCC)    a. EF 25% by echo in 07/2014. Declined ICD placement.   . Chronic systolic  CHF (congestive heart failure) (HCC) 11/06/2014  . COPD (chronic obstructive pulmonary disease) (HCC)   . Depression   . Emphysema of lung (HCC)   . Fatigue   . Hypertension   . Ischemic cardiomyopathy   . Musculoskeletal chest pain   . Myocardial infarction (HCC)   . SOB (shortness of breath)   . Syncope   . Tobacco abuse     Past Surgical History:  Procedure Laterality Date  . CORONARY ARTERY BYPASS GRAFT  2011   4v CABG [facility] in [place] in 06/2009 with LIMA to LAD, RIMA to distal RCA, sequential SVG to OM1/OM 2  . CRANIOTOMY Left 05/05/2015   Procedure: Left CRANIOTOMY HEMATOMA EVACUATION SUBDURAL;  Surgeon: Tia Alert, MD;  Location: [facility];  Service: Neurosurgery;  Laterality: Left;  . Left ventricular aneurysm repair       reports that he has been smoking cigarettes. He has a 42.00 pack-year smoking history. He has never used smokeless tobacco. He reports current alcohol use of about 12.0 - 20.0 standard drinks of alcohol per week. He reports that he does not use drugs.  Allergies  Allergen Reactions  . Penicillins Other (See Comments)    Doesn't remember  Has patient had a PCN reaction causing immediate rash, facial/tongue/throat swelling, SOB or lightheadedness with hypotension: unknown Has patient had a PCN reaction causing severe rash involving mucus membranes or skin necrosis: unknown Has patient had a PCN reaction that required hospitalization unknown  Has patient had a PCN reaction occurring within the last 10 years: unknown If all of the above answers are "NO", then may proceed with Cephalosporin use.     Family History  Family history unknown: Yes    Prior to Admission medications   Medication Sig Start Date End Date Taking? Authorizing Provider  albuterol (PROAIR HFA) 108 (90 Base) MCG/ACT inhaler INHALE 2 PUFFS INTO THE LUNGS EVERY 6 (SIX) HOURS AS NEEDED FOR SHORTNESS OF BREATH. 04/18/18   Danelle Berry, PA-C  aspirin 81 MG tablet Take 81 mg by  mouth daily.    [provider]  clindamycin (CLEOCIN) 300 MG capsule Take 1 capsule (300 mg total) by mouth 3 (three) times daily. 07/01/19   Donita Brooks, MD  folic acid (FOLVITE) 1 MG tablet TAKE 1 TABLET BY MOUTH EVERY DAY. APPT NEEDED FOR FURTHER REFILLS 02/05/19   [place], Peter M, MD  furosemide (LASIX) 40 MG tablet Take 1 tablet (40 mg total) by mouth 2 (two) times daily. 12/16/18   [place], Peter M, MD  HYDROcodone-acetaminophen (NORCO) 5-325 MG tablet Take 1 tablet by mouth every 6 (six) hours as needed for moderate pain. 07/18/19   Donita Brooks, MD  ipratropium-albuterol (DUONEB) 0.5-2.5 (3) MG/3ML SOLN Take 3 mLs by nebulization every 4 (four) hours. 04/18/18   Danelle Berry, PA-C  lisinopril (ZESTRIL) 10 MG tablet Take 1 tablet (10 mg total) by mouth daily. 12/16/18   [place], Peter M, MD  metoprolol tartrate (LOPRESSOR) 25 MG tablet Take 1 tablet (25 mg total) by mouth 2 (two) times daily. 12/16/18   [place], Peter M, MD  rosuvastatin (CRESTOR) 5 MG tablet Take 5 mg every other day 12/16/18   [place], Peter M, MD  sulfamethoxazole-trimethoprim (BACTRIM DS) 800-160 MG tablet Take 1 tablet by mouth 2 (two) times daily. 07/01/19   Donita Brooks, MD  tiotropium (SPIRIVA HANDIHALER) 18 MCG inhalation capsule Place 1 capsule (18 mcg total) into inhaler and inhale daily. 04/18/18   Danelle Berry, PA-C    Physical Exam: Constitutional: Moderately built and nourished. Vitals:   07/21/19 2218 07/21/19 2230 07/21/19 2300 07/21/19 2357  BP:  119/66 (!) 130/118   Pulse:  (!) 121 (!) 101   Resp:  (!) 28 (!) 21   Temp:      TempSrc:      SpO2:  93% 90% (!) 89%  Weight: 57 kg     Height: 5\' 4"  (1.626 m)      Eyes: Anicteric no pallor. ENMT: No discharge from the ears eyes nose or mouth. Neck: No mass felt.  No neck rigidity. Respiratory: No rhonchi or crepitations. Cardiovascular: S1-S2 heard. Abdomen: Soft nontender bowel sounds present. Musculoskeletal: Left foot gangrene. Skin:  Left foot gangrene. Neurologic: Alert awake oriented to his name only.  Appears confused does not follow commands. Psychiatric: Appears confused.   Labs on Admission: I have personally reviewed following labs and imaging studies  CBC: Recent Labs  Lab 07/21/19 2240 07/21/19 2310  WBC 18.0*  --   NEUTROABS 15.2*  --   HGB 12.9* 11.2*  HCT 39.8 33.0*  MCV 93.9  --   PLT 479*  --    Basic Metabolic Panel: Recent Labs  Lab 07/21/19 2240 07/21/19 2310  NA 148* 148*  K 5.3* 5.0  CL 118*  --   CO2 14*  --   GLUCOSE 136*  --   BUN 139*  --   CREATININE 3.40*  --   CALCIUM 9.3  --  GFR: Estimated Creatinine Clearance: 15.4 mL/min (A) (by C-G formula based on SCr of 3.4 mg/dL (H)). Liver Function Tests: Recent Labs  Lab 07/21/19 2240  AST 169*  ALT 88*  ALKPHOS 294*  BILITOT 0.4  PROT 8.0  ALBUMIN 3.2*   No results for input(s): LIPASE, AMYLASE in the last 168 hours. Recent Labs  Lab 07/21/19 2249  AMMONIA 34   Coagulation Profile: Recent Labs  Lab 07/21/19 2240  INR 1.0   Cardiac Enzymes: No results for input(s): CKTOTAL, CKMB, CKMBINDEX, TROPONINI in the last 168 hours. BNP (last 3 results) No results for input(s): PROBNP in the last 8760 hours. HbA1C: No results for input(s): HGBA1C in the last 72 hours. CBG: No results for input(s): GLUCAP in the last 168 hours. Lipid Profile: No results for input(s): CHOL, HDL, LDLCALC, TRIG, CHOLHDL, LDLDIRECT in the last 72 hours. Thyroid Function Tests: No results for input(s): TSH, T4TOTAL, FREET4, T3FREE, THYROIDAB in the last 72 hours. Anemia Panel: No results for input(s): VITAMINB12, FOLATE, FERRITIN, TIBC, IRON, RETICCTPCT in the last 72 hours. Urine analysis:    Component Value Date/Time   COLORURINE YELLOW 07/21/2019 2220   APPEARANCEUR HAZY (A) 07/21/2019 2220   LABSPEC 1.015 07/21/2019 2220   PHURINE 8.0 07/21/2019 2220   GLUCOSEU NEGATIVE 07/21/2019 2220   HGBUR SMALL (A) 07/21/2019 2220    BILIRUBINUR NEGATIVE 07/21/2019 2220   KETONESUR NEGATIVE 07/21/2019 2220   PROTEINUR 30 (A) 07/21/2019 2220   UROBILINOGEN 1.0 08/20/2014 0803   NITRITE NEGATIVE 07/21/2019 2220   LEUKOCYTESUR SMALL (A) 07/21/2019 2220   Sepsis Labs: @LABRCNTIP (procalcitonin:4,lacticidven:4) )No results found for this or any previous visit (from the past 240 hour(s)).   Radiological Exams on Admission: DG Chest Port 1 View  Result Date: 07/21/2019 CLINICAL DATA:  Fever EXAM: PORTABLE CHEST 1 VIEW COMPARISON:  07/25/2016 FINDINGS: Post sternotomy changes. Fractured upper sternal wires. Mild cardiomegaly. No consolidation or pleural effusion. Aortic atherosclerosis. No pneumothorax. Mild scarring at the right lung base. IMPRESSION: No active disease. Electronically Signed   By: Donavan Foil M.D.   On: 07/21/2019 22:59   DG Foot 2 Views Left  Result Date: 07/21/2019 CLINICAL DATA:  Left foot infection EXAM: LEFT FOOT - 2 VIEW COMPARISON:  07/01/2019 FINDINGS: No fracture or malalignment. No periostitis or bone destruction. No soft tissue emphysema IMPRESSION: No acute osseous abnormality Electronically Signed   By: Donavan Foil M.D.   On: 07/21/2019 22:58    EKG: Independently reviewed.  Sinus tachycardia.  Assessment/Plan Active Problems:   S/P CABG (coronary artery bypass graft)   Anemia of chronic disease   COPD (chronic obstructive pulmonary disease) (HCC)   Chronic combined systolic and diastolic heart failure (HCC)   Ischemic foot   DNR (do not resuscitate)   AKI (acute kidney injury) ([place])   Acute encephalopathy    1. Possible sepsis from left foot gangrene. 2. Acute renal failure on chronic and disease stage III. 3. Elevated LFTs could be from sepsis. 4. Acute encephalopathy likely from sepsis and metabolic reasons. 5. CAD status post CABG. 6. History of ischemic cardiomyopathy. 7. COPD.  Plan -at this time after having extensive discussion with patient's healthcare power of  attorney it is decided patient to be made only comfort measures.  No further labs.  Morphine as needed.  Palliative care consult patient is DNR.  Given that patient is admitted for possible sepsis with potential further deterioration will need inpatient status.   DVT prophylaxis: Heparin. Code Status: DNR as confirmed with  healthcare power of attorney. Family Communication: Healthcare power of attorney. Disposition Plan: To be determined. Consults called: Palliative care. Admission status: Inpatient.   Eduard Clos MD [facility] Pager [phone].  If 7PM-7AM, please contact night-coverage [email or web address] Password [facility]  07/22/2019, 12:14 AM

## 2019-07-22 NOTE — Progress Notes (Signed)
PROGRESS NOTE    Cory Dennis  SAY:[id number]  DOB: Aug 25, 1945  PCP: Donita Brooks, MD Admit date:07/21/2019  74 y.o. male with history of CAD s/p CABG, ischemic cardiomyopathy EF 25%, CKD baseline creatinine around 1.5, HTN,  COPD, ongoing tobacco abuse brought to the ER with AMS/confusion, and worsening wound in the left lower extremity.  Patient has chronic wound on the left lower extremity which was evaluated by vascular surgeon last week and was advised to have amputation but patient had declined. ED Course: Afebrile, left gangrenous foot with no pulses detectable.  Labs show worsening renal function from 1.5 it is presently around 3.4 potassium 5.3 bicarb 14 sodium 148 potassium 5.3 AST 169 ALT 88 CBC shows WBC of 18,000 hemoglobin 12.9 platelets 479.  X-rays do not show anything acute.  EKG shows sinus tachycardia. Hospital course: Patient admitted to [facility] for initiation of comfort measures after discussing care goals with family.  Assessment/Plan: 1. Possible sepsis from left foot gangrene. 2. Acute renal failure on chronic and disease stage III. 3. Elevated LFTs could be from sepsis. 4. Acute encephalopathy likely from sepsis and metabolic reasons. 5. CAD status post CABG. 6. History of ischemic cardiomyopathy. 7. COPD.  Plan -continue comfort measures.  palliative care team has seen this am and adjusted meds. They are in d/w family regarding home vs residential hospice. No further labs.  Morphine as needed.  He is saturating well on 3lits 02, lethargic but appears comfortable  DVT prophylaxis: d/c Heparin given comfort goals Code Status: DNR Disposition Plan:   Patient is from home prior to hospitalization. Received/Receiving inpatient care for metabolic encephalopathy/gangrenous changes in left toe with comfort measures. Discharge when cleared by palliative care and home vs inpatient hospice delineated.    Subjective:  appears lethargic but opens eyes spontaneously  on 3lits 02  Objective: Vitals:   07/22/19 0200 07/22/19 0245 07/22/19 0337 07/22/19 0819  BP: (!) 146/124 107/69 (!) 154/80 (!) 110/55  Pulse: (!) 108 92 76 92  Resp:  20 17 18   Temp:  98.6 F (37 C) 98.1 F (36.7 C) 97.7 F (36.5 C)  TempSrc:  Axillary Oral Oral  SpO2: (!) 89% 93% 100% 94%  Weight:      Height:        Intake/Output Summary (Last 24 hours) at 07/22/2019 0909 Last data filed at 07/21/2019 2357 Gross per 24 hour  Intake --  Output 25 ml  Net -25 ml   Filed Weights   07/21/19 2218  Weight: 57 kg    Physical Examination:  General exam: Appears calm and comfortable  Respiratory system: Clear to auscultation. Respiratory effort normal. Cardiovascular system: S1 & S2 heard, RRR. No JVD, murmurs, rubs, gallops or clicks. No pedal edema. Gastrointestinal system: Abdomen is nondistended, soft and nontender. Normal bowel sounds heard. Central nervous system: Alert and oriented. No new focal neurological deficits. Extremities: gangrenous changes in left foot/multiple toes Skin: No rashes, lesions or ulcers Psychiatry: Judgement and insight appear normal. Mood & affect appropriate.   Data Reviewed: I have personally reviewed following labs and imaging studies  CBC: Recent Labs  Lab 07/21/19 2240 07/21/19 2310  WBC 18.0*  --   NEUTROABS 15.2*  --   HGB 12.9* 11.2*  HCT 39.8 33.0*  MCV 93.9  --   PLT 479*  --    Basic Metabolic Panel: Recent Labs  Lab 07/21/19 2240 07/21/19 2310  NA 148* 148*  K 5.3* 5.0  CL 118*  --  CO2 14*  --   GLUCOSE 136*  --   BUN 139*  --   CREATININE 3.40*  --   CALCIUM 9.3  --    GFR: Estimated Creatinine Clearance: 15.4 mL/min (A) (by C-G formula based on SCr of 3.4 mg/dL (H)). Liver Function Tests: Recent Labs  Lab 07/21/19 2240  AST 169*  ALT 88*  ALKPHOS 294*  BILITOT 0.4  PROT 8.0  ALBUMIN 3.2*   No results for input(s): LIPASE, AMYLASE in the last 168 hours. Recent Labs  Lab 07/21/19 2249  AMMONIA  34   Coagulation Profile: Recent Labs  Lab 07/21/19 2240  INR 1.0   Cardiac Enzymes: No results for input(s): CKTOTAL, CKMB, CKMBINDEX, TROPONINI in the last 168 hours. BNP (last 3 results) No results for input(s): PROBNP in the last 8760 hours. HbA1C: No results for input(s): HGBA1C in the last 72 hours. CBG: No results for input(s): GLUCAP in the last 168 hours. Lipid Profile: No results for input(s): CHOL, HDL, LDLCALC, TRIG, CHOLHDL, LDLDIRECT in the last 72 hours. Thyroid Function Tests: No results for input(s): TSH, T4TOTAL, FREET4, T3FREE, THYROIDAB in the last 72 hours. Anemia Panel: No results for input(s): VITAMINB12, FOLATE, FERRITIN, TIBC, IRON, RETICCTPCT in the last 72 hours. Sepsis Labs: Recent Labs  Lab 07/21/19 2220  LATICACIDVEN 2.1*    Recent Results (from the past 240 hour(s))  Culture, blood (Routine x 2)     Status: None (Preliminary result)   Collection Time: 07/21/19 10:25 PM   Specimen: BLOOD LEFT FOREARM  Result Value Ref Range Status   Specimen Description BLOOD LEFT FOREARM  Final   Special Requests   Final    BOTTLES DRAWN AEROBIC AND ANAEROBIC Blood Culture adequate volume   Culture   Final    NO GROWTH < 12 HOURS Performed at [facility], [place] [address], [place], [place] [address]    Report Status PENDING  Incomplete  Culture, blood (Routine x 2)     Status: None (Preliminary result)   Collection Time: 07/21/19 10:40 PM   Specimen: BLOOD  Result Value Ref Range Status   Specimen Description BLOOD LEFT ANTECUBITAL  Final   Special Requests   Final    BOTTLES DRAWN AEROBIC AND ANAEROBIC Blood Culture adequate volume   Culture   Final    NO GROWTH < 12 HOURS Performed at [facility], [place] [address], [place], [place] [address]    Report Status PENDING  Incomplete  Surgical pcr screen     Status: None   Collection Time: 07/22/19  2:38 AM   Specimen: Nasal Mucosa; Nasal Swab  Result Value Ref Range Status   MRSA, PCR  NEGATIVE NEGATIVE Final   Staphylococcus aureus NEGATIVE NEGATIVE Final    Comment: (NOTE) The Xpert SA Assay (FDA approved for NASAL specimens in patients 39 years of age and older), is one component of a comprehensive surveillance program. It is not intended to diagnose infection nor to guide or monitor treatment. Performed at [facility], [place] [address], [place], [place] [address]       Radiology Studies: DG Chest Port 1 View  Result Date: 07/21/2019 CLINICAL DATA:  Fever EXAM: PORTABLE CHEST 1 VIEW COMPARISON:  07/25/2016 FINDINGS: Post sternotomy changes. Fractured upper sternal wires. Mild cardiomegaly. No consolidation or pleural effusion. Aortic atherosclerosis. No pneumothorax. Mild scarring at the right lung base. IMPRESSION: No active disease. Electronically Signed   By: Donavan Foil M.D.   On: 07/21/2019  22:59   DG Foot 2 Views Left  Result Date: 07/21/2019 CLINICAL DATA:  Left foot infection EXAM: LEFT FOOT - 2 VIEW COMPARISON:  07/01/2019 FINDINGS: No fracture or malalignment. No periostitis or bone destruction. No soft tissue emphysema IMPRESSION: No acute osseous abnormality Electronically Signed   By: Jasmine Pang M.D.   On: 07/21/2019 22:58        Scheduled Meds: . heparin  5,000 Units Subcutaneous Q8H       Alessandra Bevels, MD [facility] Pager in [place]  If 7PM-7AM, please contact night-coverage [email or web address] 07/22/2019, 9:09 AM

## 2019-07-22 NOTE — Plan of Care (Signed)

## 2019-07-22 NOTE — Consult Note (Signed)
Consultation Note Date: 07/22/2019   Patient Name: Cory Dennis  DOB: April 04, 1946  MRN: [id number]  Age / Sex: 74 y.o., male  PCP: Donita Brooks, MD Referring Physician: Alessandra Bevels, MD  Reason for Consultation: Establishing goals of care, Hospice Evaluation and Psychosocial/spiritual support  HPI/Patient Profile: 74 y.o. male  with past medical history of CAD sp CABG, ischemic cardiomyopathy EF 25%, CKD, CR ~ 1.5, HTN, COPD- tobacco use, chronic wound LLE, last week vascular surgeon advised amputation, but Cory Dennis declined admitted on 07/21/2019 with possible sepsis from left foot gangrene, acute on chronic renal failure.   Clinical Assessment and Goals of Care: [name] is resting quietly in bed.  He appears acutely ill and frail.  He will make an somewhat keep eye contact but is only able to mumble his name, not tell me where we are.  I believe he is able to make his basic needs known. There is no family at bedside at this time.  Call to healthcare power of attorney/like a granddaughter, Aldean Ast.  Leeroy Bock shares that she has healthcare power of attorney, her mother, Edwinna Areola is second.  We talked about Cory Dennis acute health concerns, his frailty.  We talked in detail about residential hospice, comfort and dignity in a more home like setting.  Chelsea states that she would like to reach out to her mother for help with decision-making.  Palliative medicine contact information provided.  Return call from [place], Aldean Ast, requesting comfort and dignity at end of life, residential Hospice at [facility] place.  We discuss logistics of hospice/bed availablity and transport.     Conference with attending, bedside nursing staff, social worker related to patient condition, needs, disposition.  HCPOA   HCPOA - long time friend, Aldean Ast, second is her mother, Edwinna Areola   SUMMARY  OF RECOMMENDATIONS   Full comfort care Recommending residential hospice  Code Status/Advance Care Planning:  DNR   [place]enrod form completed  Symptom Management:   Medications adjusted for pain and anxiety  Palliative Prophylaxis:   Frequent Pain Assessment, Palliative Wound Care and Turn Reposition  Additional Recommendations (Limitations, Scope, Preferences):  Full Comfort Care  Psycho-social/Spiritual:   Desire for further Chaplaincy support:no  Additional Recommendations: Caregiving  Support/Resources and Education on Hospice  Prognosis:   < 2 weeks  Discharge Planning: comfort and dignity at end of life, residential hospice at [facility]       Primary Diagnoses: Present on Admission: . Ischemic foot . DNR (do not resuscitate) . Chronic combined systolic and diastolic heart failure (HCC) . Acute encephalopathy . COPD (chronic obstructive pulmonary disease) (HCC) . Anemia of chronic disease   I have reviewed the medical record, interviewed the patient and family, and examined the patient. The following aspects are pertinent.  Past Medical History:  Diagnosis Date  . Alcohol abuse   . Anemia   . Anxiety   . Arthritis   . CAD (coronary artery disease)    a. s/p CABG in 2011 with LIMA-LAD, RIMA-distal  RCA, and sequential SVG-OM1/OM 2 complicated by nonunion of sternum)  . CHF (congestive heart failure) (HCC)    a. EF 25% by echo in 07/2014. Declined ICD placement.   . Chronic systolic CHF (congestive heart failure) (HCC) 11/06/2014  . COPD (chronic obstructive pulmonary disease) (HCC)   . Depression   . Emphysema of lung (HCC)   . Fatigue   . Hypertension   . Ischemic cardiomyopathy   . Musculoskeletal chest pain   . Myocardial infarction (HCC)   . SOB (shortness of breath)   . Syncope   . Tobacco abuse    Social History   Socioeconomic History  . Marital status: Single    Spouse name: Not on file  . Number of children: Not on file  .  Years of education: Not on file  . Highest education level: Not on file  Occupational History  . Occupation: Curator  Tobacco Use  . Smoking status: Current Every Day Smoker    Packs/day: 1.00    Years: 42.00    Pack years: 42.00    Types: Cigarettes  . Smokeless tobacco: Never Used  . Tobacco comment: working on it  Substance and Sexual Activity  . Alcohol use: Yes    Alcohol/week: 12.0 - 20.0 standard drinks    Types: 12 - 20 Cans of beer per week  . Drug use: No  . Sexual activity: Not Currently  Other Topics Concern  . Not on file  Social History Narrative  . Not on file   Social Determinants of Health   Financial Resource Strain:   . Difficulty of Paying Living Expenses: Not on file  Food Insecurity:   . Worried About Programme researcher, broadcasting/film/video in the Last Year: Not on file  . Ran Out of Food in the Last Year: Not on file  Transportation Needs:   . Lack of Transportation (Medical): Not on file  . Lack of Transportation (Non-Medical): Not on file  Physical Activity:   . Days of Exercise per Week: Not on file  . Minutes of Exercise per Session: Not on file  Stress:   . Feeling of Stress : Not on file  Social Connections:   . Frequency of Communication with Friends and Family: Not on file  . Frequency of Social Gatherings with Friends and Family: Not on file  . Attends Religious Services: Not on file  . Active Member of Clubs or Organizations: Not on file  . Attends Banker Meetings: Not on file  . Marital Status: Not on file   Family History  Family history unknown: Yes   Scheduled Meds: Continuous Infusions: PRN Meds:.acetaminophen **OR** acetaminophen, albuterol, morphine injection, ondansetron **OR** ondansetron (ZOFRAN) IV Medications Prior to Admission:  Prior to Admission medications   Medication Sig Start Date End Date Taking? Authorizing Provider  albuterol (PROAIR HFA) 108 (90 Base) MCG/ACT inhaler INHALE 2 PUFFS INTO THE LUNGS EVERY 6 (SIX)  HOURS AS NEEDED FOR SHORTNESS OF BREATH. 04/18/18   Danelle Berry, PA-C  aspirin 81 MG tablet Take 81 mg by mouth daily.    [provider]  clindamycin (CLEOCIN) 300 MG capsule Take 1 capsule (300 mg total) by mouth 3 (three) times daily. 07/01/19   Donita Brooks, MD  folic acid (FOLVITE) 1 MG tablet TAKE 1 TABLET BY MOUTH EVERY DAY. APPT NEEDED FOR FURTHER REFILLS 02/05/19   [place], Peter M, MD  furosemide (LASIX) 40 MG tablet Take 1 tablet (40 mg total) by mouth 2 (two)  times daily. 12/16/18   [place], Peter M, MD  HYDROcodone-acetaminophen (NORCO) 5-325 MG tablet Take 1 tablet by mouth every 6 (six) hours as needed for moderate pain. 07/18/19   Susy Frizzle, MD  ipratropium-albuterol (DUONEB) 0.5-2.5 (3) MG/3ML SOLN Take 3 mLs by nebulization every 4 (four) hours. 04/18/18   Delsa Grana, PA-C  lisinopril (ZESTRIL) 10 MG tablet Take 1 tablet (10 mg total) by mouth daily. 12/16/18   [place], Peter M, MD  metoprolol tartrate (LOPRESSOR) 25 MG tablet Take 1 tablet (25 mg total) by mouth 2 (two) times daily. 12/16/18   [place], Peter M, MD  rosuvastatin (CRESTOR) 5 MG tablet Take 5 mg every other day 12/16/18   [place], Peter M, MD  sulfamethoxazole-trimethoprim (BACTRIM DS) 800-160 MG tablet Take 1 tablet by mouth 2 (two) times daily. 07/01/19   Susy Frizzle, MD  tiotropium (SPIRIVA HANDIHALER) 18 MCG inhalation capsule Place 1 capsule (18 mcg total) into inhaler and inhale daily. 04/18/18   Delsa Grana, PA-C   Allergies  Allergen Reactions  . Penicillins Other (See Comments)    Doesn't remember  Has patient had a PCN reaction causing immediate rash, facial/tongue/throat swelling, SOB or lightheadedness with hypotension: unknown Has patient had a PCN reaction causing severe rash involving mucus membranes or skin necrosis: unknown Has patient had a PCN reaction that required hospitalization unknown Has patient had a PCN reaction occurring within the last 10 years: unknown If all of the  above answers are "NO", then may proceed with Cephalosporin use.    Review of Systems  Unable to perform ROS: Acuity of condition    Physical Exam Vitals and nursing note reviewed.  Constitutional:      General: He is not in acute distress.    Appearance: He is ill-appearing.  Cardiovascular:     Rate and Rhythm: Normal rate.  Pulmonary:     Effort: Pulmonary effort is normal. No respiratory distress.  Abdominal:     General: Abdomen is flat.     Tenderness: There is no abdominal tenderness.  Musculoskeletal:        General: Signs of injury present.     Comments: Left foot red, swollen, areas of discoloration  Skin:    General: Skin is warm and dry.  Neurological:     Comments: Able to mumble his name only  Psychiatric:     Comments: Calm, not fearful     Vital Signs: BP (!) 110/55 (BP Location: Left Arm)   Pulse 92   Temp 97.7 F (36.5 C) (Oral)   Resp 18   Ht 5\' 4"  (1.626 m)   Wt 57 kg   SpO2 94%   BMI 21.57 kg/m  Pain Scale: 0-10   Pain Score: 0-No pain   SpO2: SpO2: 94 % O2 Device:SpO2: 94 % O2 Flow Rate: .O2 Flow Rate (L/min): 3 L/min  IO: Intake/output summary:   Intake/Output Summary (Last 24 hours) at 07/22/2019 1048 Last data filed at 07/21/2019 2357 Gross per 24 hour  Intake --  Output 25 ml  Net -25 ml    LBM:   Baseline Weight: Weight: 57 kg Most recent weight: Weight: 57 kg     Palliative Assessment/Data:   Flowsheet Rows     Most Recent Value  Intake Tab  Referral Department  Hospitalist  Unit at Time of Referral  Med/Surg Unit  Palliative Care Primary Diagnosis  Sepsis/Infectious Disease  Date Notified  07/22/19  Palliative Care Type  New Palliative care  Reason  for referral  Clarify Goals of Care, Psychosocial or Spiritual support  Date of Admission  07/21/19  Date first seen by Palliative Care  07/22/19  # of days Palliative referral response time  0 Day(s)  # of days IP prior to Palliative referral  1  Clinical Assessment   Palliative Performance Scale Score  30%  Pain Max last 24 hours  Not able to report  Pain Min Last 24 hours  Not able to report  Dyspnea Max Last 24 Hours  Not able to report  Dyspnea Min Last 24 hours  Not able to report  Psychosocial & Spiritual Assessment  Palliative Care Outcomes      Time In: 0950 Time Out: 1100 Time Total: 70 minutes Greater than 50%  of this time was spent counseling and coordinating care related to the above assessment and plan.  Signed by: Katheran Awe, NP   Please contact Palliative Medicine Team phone at [phone] for questions and concerns.  For individual provider: See Loretha Stapler

## 2019-07-22 NOTE — ED Notes (Signed)
Date and time results received: 07/22/19 0000  Test: lactic acid Critical Value: 2.1  Name of Provider Notified: Toniann Fail  Orders Received? Or Actions Taken?: No new orders

## 2019-07-23 DIAGNOSIS — G9341 Metabolic encephalopathy: Secondary | ICD-10-CM

## 2019-07-23 DIAGNOSIS — D638 Anemia in other chronic diseases classified elsewhere: Secondary | ICD-10-CM

## 2019-07-23 DIAGNOSIS — I5042 Chronic combined systolic (congestive) and diastolic (congestive) heart failure: Secondary | ICD-10-CM

## 2019-07-23 DIAGNOSIS — J9601 Acute respiratory failure with hypoxia: Secondary | ICD-10-CM

## 2019-07-23 DIAGNOSIS — Z66 Do not resuscitate: Secondary | ICD-10-CM

## 2019-07-23 DIAGNOSIS — J439 Emphysema, unspecified: Secondary | ICD-10-CM

## 2019-07-23 MED ORDER — ONDANSETRON 4 MG PO TBDP: 4.0000 mg | ORAL_TABLET | Freq: Four times a day (QID) | ORAL | 0 refills | Status: AC | PRN

## 2019-07-23 MED ORDER — ACETAMINOPHEN 325 MG PO TABS: 650.0000 mg | ORAL_TABLET | Freq: Four times a day (QID) | ORAL | Status: AC | PRN

## 2019-07-23 MED ORDER — ALBUTEROL SULFATE (2.5 MG/3ML) 0.083% IN NEBU: 2.5000 mg | INHALATION_SOLUTION | RESPIRATORY_TRACT | 12 refills | Status: AC | PRN

## 2019-07-23 MED ORDER — MORPHINE SULFATE (PF) 2 MG/ML IV SOLN: 1.0000 mg | INTRAVENOUS | 0 refills | Status: AC | PRN

## 2019-07-23 MED ORDER — POLYVINYL ALCOHOL 1.4 % OP SOLN: 1.0000 [drp] | Freq: Four times a day (QID) | OPHTHALMIC | 0 refills | Status: AC | PRN

## 2019-07-23 MED ORDER — BIOTENE DRY MOUTH MT LIQD: 15.0000 mL | OROMUCOSAL | Status: AC | PRN

## 2019-07-23 MED ORDER — GLYCOPYRROLATE 1 MG PO TABS: 1.0000 mg | ORAL_TABLET | ORAL | Status: AC | PRN

## 2019-07-23 NOTE — Progress Notes (Signed)
Manufacturing systems engineer [facility]) Hospital Liaison note.    Received request from [facility] manager for family interest in [facility] with request for transfer. Chart reviewed and eligibility confirmed. Patient can transfer today.     Registration paperwork completed.   RN please call report to [phone]. Please arrange transport for patient.  Thank you,     Elsie Saas, RN, CCM      [facility] Liaison (listed on [facility] under [facility] [place])    [phone]

## 2019-07-23 NOTE — TOC Transition Note (Signed)
Transition of Care [facility]) - CM/SW Discharge Note   Patient Details  Name: NASHUA HOMEWOOD MRN: [id number] Date of Birth: 1945/08/08  Transition of Care [facility]) CM/SW Contact:  Bess Kinds, RN Phone Number: [phone] 07/23/2019, 4:42 PM   Clinical Narrative:    Contacted by liaison at [facility]) that patient was ready for admission to [facility]. PTAR arranged. Bedside nurse made aware and number for report to [facility] [phone]. Spoke with patient's HCPOA, Aldean Ast, to advise of transportation arrangements. No further TOC needs identified.    Final next level of care: Hospice Medical Facility Barriers to Discharge: No Barriers Identified   Patient Goals and CMS Choice Patient states their goals for this hospitalization and ongoing recovery are:: comfort care CMS Medicare.gov Compare Post Acute Care list provided to:: Patient Represenative (must comment) Choice offered to / list presented to : [facility] POA / Guardian  Discharge Placement                Patient to be transferred to facility by: PTAR Name of family member notified: Chelsea Patient and family notified of of transfer: 07/23/19  Discharge Plan and Services In-house Referral: Hospice / Palliative Care Discharge Planning Services: CM Consult Post Acute Care Choice: Hospice          DME Arranged: N/A DME Agency: NA       HH Arranged: NA HH Agency: NA        Social Determinants of Health (SDOH) Interventions     Readmission Risk Interventions No flowsheet data found.

## 2019-07-23 NOTE — Progress Notes (Signed)
Palliative:   Mr. Maceachern is resting quietly in bed.  He will briefly make but not keep eye contact.  He appears acutely/chronically ill, but comfortable.  I am not sure that he can make his basic needs known.  There is no visitor at bedside.  I ask if he is having pain and he shakes his head no, I ask if he would like medicines for his nerves and he again shakes his head no.  He is able to take a few sips of tea at bedside, but with difficulty.  He has a wet productive cough afterwards, I consider that he is aspirating.  Conference with bedside nursing staff related to patient condition, needs, comfort care. No call to [facility] POA, goal set for residential hospice care.  Plan: Comfort and dignity at end-of-life, residential hospice at [facility] place. HC POA is Aldean Ast, longtime friend.  35 minutes  Lillia Carmel, NP  Palliative Medicine Team Team Phone # [phone] Greater than 50% of this time was spent counseling and coordinating care related to the above assessment and plan.

## 2019-07-23 NOTE — Plan of Care (Signed)
  Problem: Safety: Goal: Ability to remain free from injury will improve Outcome: Progressing   

## 2019-07-23 NOTE — Discharge Summary (Signed)
Physician Discharge Summary  Cory Dennis KZS:[id number] DOB: 1945-08-28 DOA: 07/21/2019  PCP: Susy Frizzle, MD  Admit date: 07/21/2019 Discharge date: 07/23/2019 Consultations: Palliative care Admitted From: home Disposition: hospice facility--[facility]  Discharge Diagnoses:  Principal Problem:   Acute metabolic encephalopathy Active Problems:   Acute respiratory failure with hypoxia ([place])   Ischemic foot   Sepsis with acute renal failure without septic shock (HCC)   Goals of care, counseling/discussion   S/P CABG (coronary artery bypass graft)   Anemia of chronic disease   COPD (chronic obstructive pulmonary disease) ([place])   Chronic combined systolic and diastolic heart failure (HCC)   DNR (do not resuscitate)   AKI (acute kidney injury) ([place])   Acute encephalopathy   Palliative care by specialist   Encounter for hospice care discussion   Hospital Course Summary:  74 y.o.malewithhistory of CAD s/p CABG, ischemic cardiomyopathy EF 25%, CKD baseline creatinine around 1.5, HTN,  COPD, ongoing tobacco abuse brought to the ER with AMS/confusion, and worsening wound in the left lower extremity. Patient has chronic wound on the left lower extremity which was evaluated by vascular surgeon last week and was advised to have amputation but patient had declined. ED Course: Afebrile, tachycardic with HR 108, tachypneic with RR 23 and o2 sat 88% on RAleft gangrenous foot with no pulses detectable. Labs show worsening renal function from 1.5 it is presently around 3.4 potassium 5.3 bicarb 14 sodium 148 potassium 5.3 AST 169 ALT 88 CBC shows WBC of 18,000 hemoglobin 12.9 platelets 479. X-rays do not show anything acute. EKG shows sinus tachycardia. Hospital course: Patient admitted to [facility] for initiation of comfort measures after discussing care goals with family. He was placed on 3lits O2 yesterday for mild hypoxic respiratory failure likely due to COPD exacerbation, improved with  neb treatments , still has some upper airway congestion but saturating 93-97% on RA today.   Assessment/Plan: 1. Sepsis from left foot gangrene . 2. Acute renal failure on chronic and disease stage III. 3. Elevated LFTs could be from sepsis. 4. Acute encephalopathy likely from sepsis and metabolic reasons. 5. Acute hypoxic respiratory failure POA due to COPD exacerbation likely. 6. History of ischemic cardiomyopathy 7. CAD status post CABG.  Plan-continue comfort measures.Palliative care team met with family and adjusted medications per comfort goals. They are recommending residential hospice and patient has bed at [facility] today. No further labs. Morphine as needed. He is saturating well on RA, lethargic but appears comfortable  Code Status: DNR    Discharge Exam:  Vitals:   07/22/19 2008 07/23/19 0321  BP: 97/63 110/60  Pulse: 79 97  Resp: 17 15  Temp: 97.6 F (36.4 C) 97.7 F (36.5 C)  SpO2: 93% 93%   Vitals:   07/22/19 1500 07/22/19 1521 07/22/19 2008 07/23/19 0321  BP: 119/63 119/63 97/63 110/60  Pulse: 98 98 79 97  Resp: _0 Temp: 97.6 F (36.4 C) 97.6 F (36.4 C) 97.6 F (36.4 C) 97.7 F (36.5 C)  TempSrc: Oral Oral Oral Axillary  SpO2: 97% 97% 93% 93%  Weight:      Height:        General: Pt appears awake but tired/confused/lethargic , not in acute distress Cardiovascular: RRR, S1/S2 +, no rubs, no gallops Respiratory: upper airway sounds with scattered wheezing Abdominal: Soft, NT, ND, bowel sounds + Extremities: no edema, gangrenous changes in left foot/multiple toes  Discharge Condition:Stable Diet recommendation: Comfort feeds Recommendations for Outpatient Follow-up:  F/u  hospice MD at [facility] Equipment/Devices upon discharge: 02/foley prn for comfort   Discharge Instructions:   Allergies as of 07/23/2019      Reactions   Penicillins Other (See Comments)   Doesn't remember Has patient had a PCN reaction causing immediate  rash, facial/tongue/throat swelling, SOB or lightheadedness with hypotension: unknown Has patient had a PCN reaction causing severe rash involving mucus membranes or skin necrosis: unknown Has patient had a PCN reaction that required hospitalization unknown Has patient had a PCN reaction occurring within the last 10 years: unknown If all of the above answers are "NO", then may proceed with Cephalosporin use.      Medication List    STOP taking these medications   aspirin 81 MG tablet   folic acid 1 MG tablet Commonly known as: FOLVITE   furosemide 40 MG tablet Commonly known as: LASIX   HYDROcodone-acetaminophen 5-325 MG tablet Commonly known as: Norco   lisinopril 10 MG tablet Commonly known as: ZESTRIL   metoprolol tartrate 25 MG tablet Commonly known as: LOPRESSOR   rosuvastatin 5 MG tablet Commonly known as: CRESTOR   sulfamethoxazole-trimethoprim 800-160 MG tablet Commonly known as: BACTRIM DS     TAKE these medications   acetaminophen 325 MG tablet Commonly known as: TYLENOL Take 2 tablets (650 mg total) by mouth every 6 (six) hours as needed for mild pain (or Fever >/= 101).   albuterol (2.5 MG/3ML) 0.083% nebulizer solution Commonly known as: PROVENTIL Inhale 3 mLs (2.5 mg total) into the lungs every 4 (four) hours as needed for wheezing or shortness of breath.   antiseptic oral rinse Liqd Apply 15 mLs topically as needed for dry mouth.   glycopyrrolate 1 MG tablet Commonly known as: ROBINUL Take 1 tablet (1 mg total) by mouth every 4 (four) hours as needed (excessive secretions).   morphine 2 MG/ML injection Inject 0.5-1 mLs (1-2 mg total) into the vein every hour as needed (EOL care).   ondansetron 4 MG disintegrating tablet Commonly known as: ZOFRAN-ODT Take 1 tablet (4 mg total) by mouth every 6 (six) hours as needed for nausea.   polyvinyl alcohol 1.4 % ophthalmic solution Commonly known as: LIQUIFILM TEARS Place 1 drop into both eyes 4 (four)  times daily as needed for dry eyes.       Allergies  Allergen Reactions  . Penicillins Other (See Comments)    Doesn't remember  Has patient had a PCN reaction causing immediate rash, facial/tongue/throat swelling, SOB or lightheadedness with hypotension: unknown Has patient had a PCN reaction causing severe rash involving mucus membranes or skin necrosis: unknown Has patient had a PCN reaction that required hospitalization unknown Has patient had a PCN reaction occurring within the last 10 years: unknown If all of the above answers are "NO", then may proceed with Cephalosporin use.       The results of significant diagnostics from this hospitalization (including imaging, microbiology, ancillary and laboratory) are listed below for reference.    Labs: BNP (last 3 results) No results for input(s): BNP in the last 8760 hours. Basic Metabolic Panel: Recent Labs  Lab 07/21/19 2240 07/21/19 2310  NA 148* 148*  K 5.3* 5.0  CL 118*  --   CO2 14*  --   GLUCOSE 136*  --   BUN 139*  --   CREATININE 3.40*  --   CALCIUM 9.3  --    Liver Function Tests: Recent Labs  Lab 07/21/19 2240  AST 169*  ALT 88*  ALKPHOS  294*  BILITOT 0.4  PROT 8.0  ALBUMIN 3.2*   No results for input(s): LIPASE, AMYLASE in the last 168 hours. Recent Labs  Lab 07/21/19 2249  AMMONIA 34   CBC: Recent Labs  Lab 07/21/19 2240 07/21/19 2310  WBC 18.0*  --   NEUTROABS 15.2*  --   HGB 12.9* 11.2*  HCT 39.8 33.0*  MCV 93.9  --   PLT 479*  --    Cardiac Enzymes: No results for input(s): CKTOTAL, CKMB, CKMBINDEX, TROPONINI in the last 168 hours. BNP: Invalid input(s): POCBNP CBG: No results for input(s): GLUCAP in the last 168 hours. D-Dimer No results for input(s): DDIMER in the last 72 hours. Hgb A1c No results for input(s): HGBA1C in the last 72 hours. Lipid Profile No results for input(s): CHOL, HDL, LDLCALC, TRIG, CHOLHDL, LDLDIRECT in the last 72 hours. Thyroid function  studies No results for input(s): TSH, T4TOTAL, T3FREE, THYROIDAB in the last 72 hours.  Invalid input(s): FREET3 Anemia work up No results for input(s): VITAMINB12, FOLATE, FERRITIN, TIBC, IRON, RETICCTPCT in the last 72 hours. Urinalysis    Component Value Date/Time   COLORURINE YELLOW 07/21/2019 2220   APPEARANCEUR HAZY (A) 07/21/2019 2220   LABSPEC 1.015 07/21/2019 2220   PHURINE 8.0 07/21/2019 2220   GLUCOSEU NEGATIVE 07/21/2019 2220   HGBUR SMALL (A) 07/21/2019 2220   BILIRUBINUR NEGATIVE 07/21/2019 2220   KETONESUR NEGATIVE 07/21/2019 2220   PROTEINUR 30 (A) 07/21/2019 2220   UROBILINOGEN 1.0 08/20/2014 0803   NITRITE NEGATIVE 07/21/2019 2220   LEUKOCYTESUR SMALL (A) 07/21/2019 2220   Sepsis Labs Invalid input(s): PROCALCITONIN,  WBC,  LACTICIDVEN Microbiology Recent Results (from the past 240 hour(s))  Culture, blood (Routine x 2)     Status: None (Preliminary result)   Collection Time: 07/21/19 10:25 PM   Specimen: BLOOD LEFT FOREARM  Result Value Ref Range Status   Specimen Description BLOOD LEFT FOREARM  Final   Special Requests   Final    BOTTLES DRAWN AEROBIC AND ANAEROBIC Blood Culture adequate volume   Culture   Final    NO GROWTH < 12 HOURS Performed at [facility], [place] [address], [place], [place] [address]    Report Status PENDING  Incomplete  Culture, blood (Routine x 2)     Status: None (Preliminary result)   Collection Time: 07/21/19 10:40 PM   Specimen: BLOOD  Result Value Ref Range Status   Specimen Description BLOOD LEFT ANTECUBITAL  Final   Special Requests   Final    BOTTLES DRAWN AEROBIC AND ANAEROBIC Blood Culture adequate volume   Culture   Final    NO GROWTH < 12 HOURS Performed at [facility], [place] [address], [place], [place] [address]    Report Status PENDING  Incomplete  Urine culture     Status: Abnormal   Collection Time: 07/21/19 11:55 PM   Specimen: In/Out Cath Urine  Result Value Ref Range Status   Specimen  Description IN/OUT CATH URINE  Final   Special Requests   Final    NONE Performed at [facility], [place] [address] [place],  [address]    Culture MULTIPLE SPECIES PRESENT, SUGGEST RECOLLECTION (A)  Final   Report Status 07/22/2019 FINAL  Final  Surgical pcr screen     Status: None   Collection Time: 07/22/19  2:38 AM   Specimen: Nasal Mucosa; Nasal Swab  Result Value Ref Range Status   MRSA, PCR NEGATIVE NEGATIVE Final  Staphylococcus aureus NEGATIVE NEGATIVE Final    Comment: (NOTE) The Xpert SA Assay (FDA approved for NASAL specimens in patients 75 years of age and older), is one component of a comprehensive surveillance program. It is not intended to diagnose infection nor to guide or monitor treatment. Performed at [facility], [place] [address] [place], [place] [address]   SARS CORONAVIRUS 2 (TAT 6-24 HRS) Nasopharyngeal Nasopharyngeal Swab     Status: None   Collection Time: 07/22/19  5:39 AM   Specimen: Nasopharyngeal Swab  Result Value Ref Range Status   SARS Coronavirus 2 NEGATIVE NEGATIVE Final    Comment: (NOTE) SARS-CoV-2 target nucleic acids are NOT DETECTED. The SARS-CoV-2 RNA is generally detectable in upper and lower respiratory specimens during the acute phase of infection. Negative results do not preclude SARS-CoV-2 infection, do not rule out co-infections with other pathogens, and should not be used as the sole basis for treatment or other patient management decisions. Negative results must be combined with clinical observations, patient history, and epidemiological information. The expected result is Negative. Fact Sheet for Patients: SugarRoll.be Fact Sheet for Healthcare Providers: https://www.woods-mathews.com/ This test is not yet approved or cleared by the [place] FDA and  has been authorized for detection and/or diagnosis of SARS-CoV-2 by FDA under an Emergency Use Authorization (EUA).  This EUA will remain  in effect (meaning this test can be used) for the duration of the COVID-19 declaration under Section 56 4(b)(1) of the Act, 21 U.S.C. section 360bbb-3(b)(1), unless the authorization is terminated or revoked sooner. Performed at [facility], [place] [address] [place], [place] [address]     Procedures/Studies: DG Chest Port 1 View  Result Date: 07/21/2019 CLINICAL DATA:  Fever EXAM: PORTABLE CHEST 1 VIEW COMPARISON:  07/25/2016 FINDINGS: Post sternotomy changes. Fractured upper sternal wires. Mild cardiomegaly. No consolidation or pleural effusion. Aortic atherosclerosis. No pneumothorax. Mild scarring at the right lung base. IMPRESSION: No active disease. Electronically Signed   By: Donavan Foil M.D.   On: 07/21/2019 22:59   DG Foot 2 Views Left  Result Date: 07/21/2019 CLINICAL DATA:  Left foot infection EXAM: LEFT FOOT - 2 VIEW COMPARISON:  07/01/2019 FINDINGS: No fracture or malalignment. No periostitis or bone destruction. No soft tissue emphysema IMPRESSION: No acute osseous abnormality Electronically Signed   By: Donavan Foil M.D.   On: 07/21/2019 22:58   DG Foot Complete Left  Result Date: 07/01/2019 CLINICAL DATA:  74 year old male with osteomyelitis of the left ankle and foot. EXAM: LEFT FOOT - COMPLETE 3+ VIEW COMPARISON:  None. FINDINGS: There is no acute fracture or dislocation. The bones are osteopenic. No periosteal reaction or bone erosion. There is mild diffuse subcutaneous edema. No radiopaque foreign object or soft tissue gas. IMPRESSION: 1. No acute fracture or dislocation. No radiographic findings of acute osteomyelitis. 2. Mild diffuse subcutaneous edema. Electronically Signed   By: Anner Crete M.D.   On: 07/01/2019 23:00   VAS [place] ABI WITH/WO TBI  Result Date: 07/15/2019 LOWER EXTREMITY DOPPLER STUDY Indications: Claudication, ulceration, and peripheral artery disease. High Risk Factors: Current smoker.  Performing Technologist: Delorise Shiner RVT  Examination Guidelines: A complete evaluation includes at minimum, Doppler waveform signals and systolic blood pressure reading at the level of bilateral brachial, anterior tibial, and posterior tibial arteries, when vessel segments are accessible. Bilateral testing is considered an integral part of a complete examination. Photoelectric Plethysmograph (PPG) waveforms and toe systolic pressure readings are included as required and additional  duplex testing as needed. Limited examinations for reoccurring indications may be performed as noted.  ABI Findings: +---------+------------------+-----+----------+--------+ Right    Rt Pressure (mmHg)IndexWaveform  Comment  +---------+------------------+-----+----------+--------+ Brachial 84                                        +---------+------------------+-----+----------+--------+ ATA      49                0.55 monophasic         +---------+------------------+-----+----------+--------+ PTA      72                0.81 monophasic         +---------+------------------+-----+----------+--------+ Great Toe0                 0.00                    +---------+------------------+-----+----------+--------+ +---------+------------------+-----+--------+-------+ Left     Lt Pressure (mmHg)IndexWaveformComment +---------+------------------+-----+--------+-------+ Brachial 89                                     +---------+------------------+-----+--------+-------+ ATA      0                 0.00 absent          +---------+------------------+-----+--------+-------+ PTA      0                 0.00 absent          +---------+------------------+-----+--------+-------+ PERO     0                 0.00 absent          +---------+------------------+-----+--------+-------+ Great Toe0                 0.00                 +---------+------------------+-----+--------+-------+  +-------+-----------+-----------+------------+------------+ ABI/TBIToday's ABIToday's TBIPrevious ABIPrevious TBI +-------+-----------+-----------+------------+------------+ Right  0.81       0                                   +-------+-----------+-----------+------------+------------+ Left   0          0                                   +-------+-----------+-----------+------------+------------+ Limited duplex of the left lower extremity confirmed absence of flow in anterior tibial, posterior tibial, peroneal, popliteal, and superficial femoral arteries. 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  Summary: Right: Resting right ankle-brachial index indicates mild right lower extremity arterial disease. The right toe-brachial index is abnormal. RT great toe pressure = 0 mmHg. Left: Resting left ankle-brachial index indicates critical left limb ischemia. The left toe-brachial index is abnormal.  *See table(s) above for measurements and observations.  Electronically signed by Monica Martinez MD on 07/15/2019 at 1:54:36 PM.    Final     Time coordinating discharge: Over 30 minutes  SIGNED:   Guilford Shi, MD  [facility] 07/23/2019, 12:45 PM Pager : [phone]

## 2019-07-23 NOTE — Progress Notes (Signed)
Report given to receiving facility. Pt not in distress and tolerated well. PTAR will give discharge paperwork to receiving facility.

## 2019-07-26 LAB — CULTURE, BLOOD (ROUTINE X 2): Special Requests: ADEQUATE

## 2019-07-27 LAB — CULTURE, BLOOD (ROUTINE X 2): Special Requests: ADEQUATE

## 2019-07-28 ENCOUNTER — Ambulatory Visit: Payer: Medicare HMO | Admitting: Cardiology

## 2019-07-28 DEATH — deceased

## 2019-08-13 ENCOUNTER — Other Ambulatory Visit: Payer: Self-pay | Admitting: Cardiology

## 2020-05-11 IMAGING — DX DG CHEST 1V PORT
1 series · 1 of 1 positions shown · non-contrast
Comparison: 07/25/2016

CLINICAL DATA: Fever

EXAM:
PORTABLE CHEST 1 VIEW

[chest]
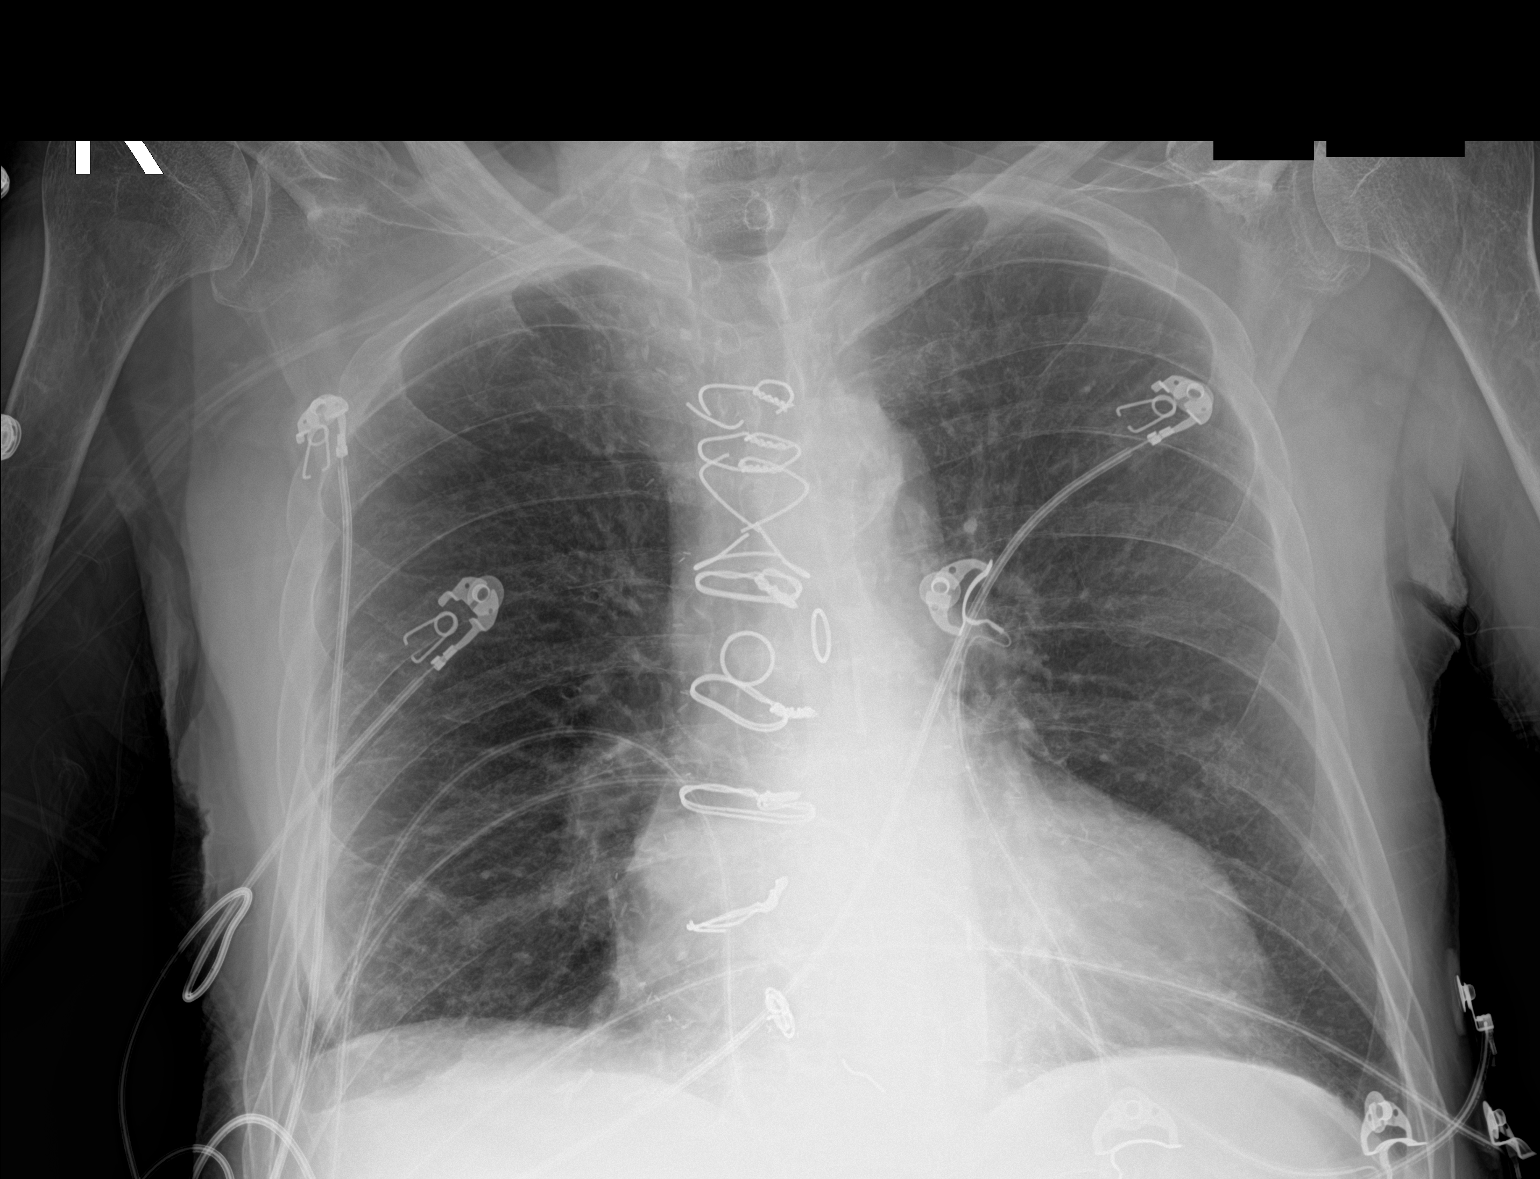

[1 of 1 positions shown; findings below may reference images not displayed]

FINDINGS: Post sternotomy changes. Fractured upper sternal wires. Mild
cardiomegaly. No consolidation or pleural effusion. Aortic
atherosclerosis. No pneumothorax. Mild scarring at the right lung
base.
IMPRESSION: No active disease.
